# Patient Record
Sex: Female | Born: 1935
Health system: Southern US, Community
[De-identification: ages and names within clinical notes are randomized; demographics above are authoritative.]

## PROBLEM LIST (undated history)

## (undated) DIAGNOSIS — Z91199 Patient's noncompliance with other medical treatment and regimen due to unspecified reason: Secondary | ICD-10-CM

## (undated) DIAGNOSIS — Z720 Tobacco use: Secondary | ICD-10-CM

## (undated) DIAGNOSIS — M1990 Unspecified osteoarthritis, unspecified site: Secondary | ICD-10-CM

## (undated) DIAGNOSIS — G8929 Other chronic pain: Secondary | ICD-10-CM

## (undated) DIAGNOSIS — I10 Essential (primary) hypertension: Secondary | ICD-10-CM

## (undated) DIAGNOSIS — I739 Peripheral vascular disease, unspecified: Secondary | ICD-10-CM

## (undated) DIAGNOSIS — Z9119 Patient's noncompliance with other medical treatment and regimen: Secondary | ICD-10-CM

## (undated) DIAGNOSIS — E785 Hyperlipidemia, unspecified: Secondary | ICD-10-CM

## (undated) DIAGNOSIS — J449 Chronic obstructive pulmonary disease, unspecified: Secondary | ICD-10-CM

## (undated) DIAGNOSIS — U071 COVID-19: Secondary | ICD-10-CM

## (undated) DIAGNOSIS — R52 Pain, unspecified: Secondary | ICD-10-CM

## (undated) DIAGNOSIS — M62838 Other muscle spasm: Secondary | ICD-10-CM

## (undated) DIAGNOSIS — R079 Chest pain, unspecified: Secondary | ICD-10-CM

## (undated) HISTORY — PX: OTHER SURGICAL HISTORY: SHX169

## (undated) HISTORY — DX: Other chronic pain: G89.29

## (undated) HISTORY — PX: BACK SURGERY: SHX140

## (undated) HISTORY — DX: Chronic obstructive pulmonary disease, unspecified: J44.9

## (undated) HISTORY — DX: Peripheral vascular disease, unspecified: I73.9

## (undated) HISTORY — DX: COVID-19: U07.1

## (undated) HISTORY — DX: Hyperlipidemia, unspecified: E78.5

---

## 1998-09-17 HISTORY — PX: JOINT REPLACEMENT: SHX530

## 1998-12-20 ENCOUNTER — Encounter: Payer: Self-pay | Admitting: Orthopedic Surgery

## 1998-12-26 ENCOUNTER — Inpatient Hospital Stay (HOSPITAL_COMMUNITY): Admission: RE | Admit: 1998-12-26 | Discharge: 1998-12-30 | Payer: Self-pay | Admitting: Orthopedic Surgery

## 1998-12-26 ENCOUNTER — Encounter: Payer: Self-pay | Admitting: Orthopedic Surgery

## 1999-04-26 ENCOUNTER — Encounter: Admission: RE | Admit: 1999-04-26 | Discharge: 1999-05-25 | Payer: Self-pay | Admitting: Orthopedic Surgery

## 1999-06-20 ENCOUNTER — Encounter: Admission: RE | Admit: 1999-06-20 | Discharge: 1999-08-28 | Payer: Self-pay | Admitting: Orthopedic Surgery

## 2000-02-23 ENCOUNTER — Encounter: Payer: Self-pay | Admitting: Emergency Medicine

## 2000-02-23 ENCOUNTER — Emergency Department (HOSPITAL_COMMUNITY): Admission: EM | Admit: 2000-02-23 | Discharge: 2000-02-23 | Payer: Self-pay | Admitting: Emergency Medicine

## 2000-03-18 ENCOUNTER — Encounter: Admission: RE | Admit: 2000-03-18 | Discharge: 2000-04-18 | Payer: Self-pay | Admitting: Orthopedic Surgery

## 2000-09-26 ENCOUNTER — Emergency Department (HOSPITAL_COMMUNITY): Admission: EM | Admit: 2000-09-26 | Discharge: 2000-09-26 | Payer: Self-pay | Admitting: Emergency Medicine

## 2002-09-20 ENCOUNTER — Encounter: Payer: Self-pay | Admitting: *Deleted

## 2002-09-20 ENCOUNTER — Emergency Department (HOSPITAL_COMMUNITY): Admission: EM | Admit: 2002-09-20 | Discharge: 2002-09-20 | Payer: Self-pay

## 2002-10-02 ENCOUNTER — Encounter: Payer: Self-pay | Admitting: Internal Medicine

## 2002-10-02 ENCOUNTER — Encounter: Admission: RE | Admit: 2002-10-02 | Discharge: 2002-10-02 | Payer: Self-pay | Admitting: Internal Medicine

## 2004-01-03 ENCOUNTER — Encounter: Admission: RE | Admit: 2004-01-03 | Discharge: 2004-03-16 | Payer: Self-pay | Admitting: Orthopedic Surgery

## 2004-02-09 ENCOUNTER — Encounter: Admission: RE | Admit: 2004-02-09 | Discharge: 2004-02-09 | Payer: Self-pay | Admitting: Internal Medicine

## 2004-03-31 ENCOUNTER — Ambulatory Visit (HOSPITAL_COMMUNITY): Admission: RE | Admit: 2004-03-31 | Discharge: 2004-03-31 | Payer: Self-pay | Admitting: Orthopedic Surgery

## 2004-04-18 ENCOUNTER — Ambulatory Visit (HOSPITAL_COMMUNITY): Admission: RE | Admit: 2004-04-18 | Discharge: 2004-04-18 | Payer: Self-pay | Admitting: *Deleted

## 2004-04-18 ENCOUNTER — Encounter (INDEPENDENT_AMBULATORY_CARE_PROVIDER_SITE_OTHER): Payer: Self-pay | Admitting: *Deleted

## 2004-05-07 ENCOUNTER — Ambulatory Visit (HOSPITAL_COMMUNITY): Admission: RE | Admit: 2004-05-07 | Discharge: 2004-05-07 | Payer: Self-pay | Admitting: Orthopedic Surgery

## 2005-05-02 ENCOUNTER — Encounter: Admission: RE | Admit: 2005-05-02 | Discharge: 2005-05-02 | Payer: Self-pay | Admitting: Internal Medicine

## 2006-06-04 ENCOUNTER — Encounter: Admission: RE | Admit: 2006-06-04 | Discharge: 2006-06-04 | Payer: Self-pay | Admitting: Internal Medicine

## 2007-02-19 ENCOUNTER — Encounter: Admission: RE | Admit: 2007-02-19 | Discharge: 2007-02-19 | Payer: Self-pay | Admitting: Orthopedic Surgery

## 2007-06-09 ENCOUNTER — Encounter: Admission: RE | Admit: 2007-06-09 | Discharge: 2007-06-09 | Payer: Self-pay | Admitting: Internal Medicine

## 2008-12-27 ENCOUNTER — Encounter: Admission: RE | Admit: 2008-12-27 | Discharge: 2008-12-27 | Payer: Self-pay | Admitting: Internal Medicine

## 2009-04-16 ENCOUNTER — Emergency Department (HOSPITAL_COMMUNITY): Admission: EM | Admit: 2009-04-16 | Discharge: 2009-04-16 | Payer: Self-pay | Admitting: Emergency Medicine

## 2010-10-08 ENCOUNTER — Encounter: Payer: Self-pay | Admitting: Orthopedic Surgery

## 2010-11-14 ENCOUNTER — Emergency Department (HOSPITAL_COMMUNITY)
Admission: EM | Admit: 2010-11-14 | Discharge: 2010-11-14 | Disposition: A | Discharge status: Home / Self Care | Payer: PRIVATE HEALTH INSURANCE | Attending: Emergency Medicine | Admitting: Emergency Medicine

## 2010-11-14 DIAGNOSIS — Y992 Volunteer activity: Secondary | ICD-10-CM | POA: Insufficient documentation

## 2010-11-14 DIAGNOSIS — I10 Essential (primary) hypertension: Secondary | ICD-10-CM | POA: Insufficient documentation

## 2010-11-14 DIAGNOSIS — M542 Cervicalgia: Secondary | ICD-10-CM | POA: Insufficient documentation

## 2010-11-14 DIAGNOSIS — T148XXA Other injury of unspecified body region, initial encounter: Secondary | ICD-10-CM | POA: Insufficient documentation

## 2010-11-14 DIAGNOSIS — X503XXA Overexertion from repetitive movements, initial encounter: Secondary | ICD-10-CM | POA: Insufficient documentation

## 2010-11-14 DIAGNOSIS — E785 Hyperlipidemia, unspecified: Secondary | ICD-10-CM | POA: Insufficient documentation

## 2010-11-14 DIAGNOSIS — M549 Dorsalgia, unspecified: Secondary | ICD-10-CM | POA: Insufficient documentation

## 2011-06-02 ENCOUNTER — Emergency Department (HOSPITAL_COMMUNITY)
Admission: EM | Admit: 2011-06-02 | Discharge: 2011-06-02 | Disposition: A | Discharge status: Home / Self Care | Payer: PRIVATE HEALTH INSURANCE | Attending: Emergency Medicine | Admitting: Emergency Medicine

## 2011-06-02 ENCOUNTER — Emergency Department (HOSPITAL_COMMUNITY): Payer: PRIVATE HEALTH INSURANCE

## 2011-06-02 DIAGNOSIS — I10 Essential (primary) hypertension: Secondary | ICD-10-CM | POA: Insufficient documentation

## 2011-06-02 DIAGNOSIS — M79609 Pain in unspecified limb: Secondary | ICD-10-CM | POA: Insufficient documentation

## 2011-06-02 DIAGNOSIS — M1990 Unspecified osteoarthritis, unspecified site: Secondary | ICD-10-CM | POA: Insufficient documentation

## 2011-06-02 DIAGNOSIS — M5137 Other intervertebral disc degeneration, lumbosacral region: Secondary | ICD-10-CM | POA: Insufficient documentation

## 2011-06-02 DIAGNOSIS — E785 Hyperlipidemia, unspecified: Secondary | ICD-10-CM | POA: Insufficient documentation

## 2011-06-02 DIAGNOSIS — Z96649 Presence of unspecified artificial hip joint: Secondary | ICD-10-CM | POA: Insufficient documentation

## 2011-06-02 DIAGNOSIS — M51379 Other intervertebral disc degeneration, lumbosacral region without mention of lumbar back pain or lower extremity pain: Secondary | ICD-10-CM | POA: Insufficient documentation

## 2011-06-02 DIAGNOSIS — M949 Disorder of cartilage, unspecified: Secondary | ICD-10-CM | POA: Insufficient documentation

## 2011-06-02 DIAGNOSIS — M899 Disorder of bone, unspecified: Secondary | ICD-10-CM | POA: Insufficient documentation

## 2011-06-02 DIAGNOSIS — M479 Spondylosis, unspecified: Secondary | ICD-10-CM | POA: Insufficient documentation

## 2011-06-02 LAB — POCT I-STAT, CHEM 8
BUN: 14 mg/dL (ref 6–23)
Calcium, Ion: 1.15 mmol/L (ref 1.12–1.32)
HCT: 41 % (ref 36.0–46.0)
Hemoglobin: 13.9 g/dL (ref 12.0–15.0)
Potassium: 4.2 mEq/L (ref 3.5–5.1)
TCO2: 25 mmol/L (ref 0–100)

## 2011-06-04 ENCOUNTER — Other Ambulatory Visit: Payer: Self-pay | Admitting: Family Medicine

## 2011-06-05 ENCOUNTER — Other Ambulatory Visit: Payer: Self-pay | Admitting: Family Medicine

## 2011-07-01 ENCOUNTER — Emergency Department (HOSPITAL_COMMUNITY)
Admission: EM | Admit: 2011-07-01 | Discharge: 2011-07-01 | Disposition: A | Discharge status: Home / Self Care | Payer: PRIVATE HEALTH INSURANCE | Attending: Emergency Medicine | Admitting: Emergency Medicine

## 2011-07-01 ENCOUNTER — Emergency Department (HOSPITAL_COMMUNITY): Payer: PRIVATE HEALTH INSURANCE

## 2011-07-01 ENCOUNTER — Encounter: Payer: Self-pay | Admitting: *Deleted

## 2011-07-01 DIAGNOSIS — M1990 Unspecified osteoarthritis, unspecified site: Secondary | ICD-10-CM | POA: Insufficient documentation

## 2011-07-01 DIAGNOSIS — M542 Cervicalgia: Secondary | ICD-10-CM | POA: Insufficient documentation

## 2011-07-01 DIAGNOSIS — F17200 Nicotine dependence, unspecified, uncomplicated: Secondary | ICD-10-CM | POA: Insufficient documentation

## 2011-07-01 HISTORY — DX: Unspecified osteoarthritis, unspecified site: M19.90

## 2011-07-01 MED ORDER — HYDROMORPHONE HCL 1 MG/ML IJ SOLN
1.0000 mg | Freq: Once | INTRAMUSCULAR | Status: AC
  Administered 2011-07-01: 1 mg via INTRAMUSCULAR
  Filled 2011-07-01: qty 1

## 2011-07-01 MED ORDER — OXYCODONE-ACETAMINOPHEN 5-325 MG PO TABS
2.0000 | ORAL_TABLET | Freq: Once | ORAL | Status: AC
  Administered 2011-07-01: 2 via ORAL
  Filled 2011-07-01: qty 2

## 2011-07-01 MED ORDER — DIAZEPAM 5 MG/ML IJ SOLN: 5.0000 mg | Freq: Once | INTRAMUSCULAR | Status: DC

## 2011-07-01 MED ORDER — DIAZEPAM 5 MG/ML IJ SOLN
5.0000 mg | Freq: Once | INTRAMUSCULAR | Status: AC
  Administered 2011-07-01: 5 mg via INTRAMUSCULAR
  Filled 2011-07-01: qty 2

## 2011-07-01 MED ORDER — DIAZEPAM 5 MG PO TABS: 5.0000 mg | ORAL_TABLET | Freq: Two times a day (BID) | ORAL | Status: AC

## 2011-07-01 MED ORDER — OXYCODONE-ACETAMINOPHEN 5-325 MG PO TABS: 2.0000 | ORAL_TABLET | ORAL | Status: AC | PRN

## 2011-07-01 NOTE — ED Provider Notes (Signed)
History   This chart was scribed for Glynn Octave, MD by Clarita Crane. The patient was seen in room APA18/APA18 and the patient's care was started at 7:12AM.   CSN: [id number] Arrival date & time: 07/01/2011  6:55 AM  Chief Complaint  Patient presents with  . Neck Pain    (Consider location/radiation/quality/duration/timing/severity/associated sxs/prior treatment) HPI Kiara Little is a 75 y.o. female who presents to the Emergency Department complaining of constant non-radiating neck pain localized to midline of posterior neck onset several weeks ago and worsening since. Reports neck pain is aggravated with turning of head laterally and mildly relieved with use of Hydrocodone. Denies n/v, HA, change in vision, numbness, tingling, weakness, chest pain, abdominal pain, SOB, back pain, incontinence. Reports she was evaluated by PCP several days ago for neck pain with imaging performed and told neck pain was result of arthritis. States she was prescribed Hydrocodone at that time. Patient with h/o hypertension and states she has not been compliant with medications.   Past Medical History  Diagnosis Date  . Arthritis     Past Surgical History  Procedure Date  . Back surgery   . Hip surgery     No family history on file.  History  Substance Use Topics  . Smoking status: Current Everyday Smoker  . Smokeless tobacco: Not on file  . Alcohol Use: No    OB History    Grav Para Term Preterm Abortions TAB SAB Ect Mult Living                  Review of Systems 10 Systems reviewed and are negative for acute change except as noted in the HPI.  Allergies  Review of patient's allergies indicates no known allergies.  Home Medications   Current Outpatient Rx  Name Route Sig Dispense Refill  . CYCLOBENZAPRINE HCL 10 MG PO TABS Oral Take 10 mg by mouth 3 (three) times daily as needed.      Marland Kitchen DICLOFENAC SODIUM 1 % TD GEL Topical Apply topically.      Marland Kitchen HYDROCODONE-ACETAMINOPHEN  5-500 MG PO TABS Oral Take 1 tablet by mouth every 6 (six) hours as needed.        BP 203/118  Pulse 88  Temp(Src) 98.5 F (36.9 C) (Oral)  Resp 20  Ht 5\' 9"  (1.753 m)  Wt 171 lb (77.565 kg)  BMI 25.25 kg/m2  SpO2 97%  Physical Exam  Nursing note and vitals reviewed. Constitutional: She is oriented to person, place, and time. She appears well-developed and well-nourished. No distress.  HENT:  Head: Normocephalic and atraumatic.  Eyes: EOM are normal. Pupils are equal, round, and reactive to light.  Neck: Neck supple. No tracheal deviation present.       No meningeal signs. No nuchal rigidity.   Cardiovascular: Normal rate and regular rhythm.  Exam reveals no friction rub.   No murmur heard. Pulmonary/Chest: Effort normal. No respiratory distress. She has no wheezes. She has no rales.  Abdominal: Soft. She exhibits no distension. There is no tenderness.  Musculoskeletal: Normal range of motion. She exhibits no edema.       T-spine, L-spine non-tender.   Neurological: She is alert and oriented to person, place, and time. No cranial nerve deficit or sensory deficit.       Strength of bilateral upper extremities normal and equal.   Skin: Skin is warm and dry.  Psychiatric: She has a normal mood and affect. Her behavior is normal.    ED  Course  Procedures (including critical care time)  DIAGNOSTIC STUDIES:   COORDINATION OF CARE: 9:02AM- Patient reports neck pain improved after administration of Hydrocodone-1mg .  9:42AM- Patient informed of imaging results which show severe arthritis. Recommend that she obtain MRI of C-spine for further evaluation but MRI unavailable today. Upon reevaluation patient is still tender to palpation over C-spine and strength still normal and equal of bilateral lower extremity.  10:38AM- Patient reports pain improved. Informed of intent to d/c home and recommend follow up with PCP tomorrow regarding chronic neck pain.   Labs Reviewed - No data to  display Ct Cervical Spine Wo Contrast  07/01/2011  *RADIOLOGY REPORT*  Clinical Data: Severe neck pain x 2 weeks, no injury  CT CERVICAL SPINE WITHOUT CONTRAST  Technique:  Multidetector CT imaging of the cervical spine was performed. Multiplanar CT image reconstructions were also generated.  Comparison: MRI c-spine dated 05/07/2004  Findings: Reversal of the normal cervical lordosis.  No evidence of fracture or dislocation. Vertebral body heights are maintained.  The dens appears intact.  No prevertebral soft tissue swelling.  Multilevel degenerative changes. Disc-osteophyte complexes with narrowing of the spinal canal at multiple levels, most severe at C6- 7.  Visualized thyroid is unremarkable.  Visualized lung apices are clear.  IMPRESSION: No fracture or dislocation is seen.  Extensive multilevel degenerative changes, with severe narrowing of the spinal canal at C6-7. This appearance is similar but likely mildly progressed from prior MRI.  Original Report Authenticated By: Charline Bills, M.D.     No diagnosis found.    MDM  History of osteoarthritis presenting with 3 weeks of worsening bilateral neck pain that is constant. She denies any weakness, numbness, tingling, dropping objects, weakened grip strength. No fever bowel or bladder incontinence or vomiting. Patient was mistaken about her recent imaging as it was actually of her lumbar spine.  She does have severe spinal stenosis at C6-C7 which is similar to her previous MRI in 2005. She has no neurological deficits or red flags demanding an MRI today. MRI is subsequent none available today and feels appropriate the abdomen is outpatient. We'll attempt to control her pain.  Patient reports improvement in pain with Percocet. Her neuro exam continues to be stable and she denies any weakness, numbness, tingling or problems with her. She is stable for outpatient followup. Is instructed to call her doctor first in the morning.  I personally  performed the services described in this documentation, which was scribed in my presence.  The recorded information has been reviewed and considered.    Glynn Octave, MD 07/01/11 1048

## 2011-07-01 NOTE — ED Notes (Signed)
Pt c/o pain in both sides of her neck. Pt c/o pain when turning her head from side to side. States that she was seen by her MD and put on meds but they are not helping. Pt also states that she had a MRI at [facility] 3 weeks ago. Pt alert and oriented x 3. Skin warm and dry. Color pink. Breath sounds clear and equal bilaterally. No weakness in arms.

## 2011-07-01 NOTE — ED Notes (Signed)
Pt a/ox4. Resp even and unlabored. NAD at this time. D/C instructions reviewed with Rx x2 . Pt verbalized understanding. Pt ambulated to POV with steady gate. Family to transport home.

## 2011-07-01 NOTE — ED Notes (Signed)
Pt reports pain to neck for the past 3 weeks, pt reports pain is unbearable, pt very tearful

## 2011-08-19 ENCOUNTER — Encounter (HOSPITAL_COMMUNITY): Payer: Self-pay

## 2011-08-19 ENCOUNTER — Emergency Department (HOSPITAL_COMMUNITY)
Admission: EM | Admit: 2011-08-19 | Discharge: 2011-08-19 | Disposition: A | Discharge status: Home / Self Care | Payer: PRIVATE HEALTH INSURANCE | Attending: Emergency Medicine | Admitting: Emergency Medicine

## 2011-08-19 DIAGNOSIS — X58XXXA Exposure to other specified factors, initial encounter: Secondary | ICD-10-CM | POA: Insufficient documentation

## 2011-08-19 DIAGNOSIS — I10 Essential (primary) hypertension: Secondary | ICD-10-CM | POA: Insufficient documentation

## 2011-08-19 DIAGNOSIS — F17200 Nicotine dependence, unspecified, uncomplicated: Secondary | ICD-10-CM | POA: Insufficient documentation

## 2011-08-19 DIAGNOSIS — T783XXA Angioneurotic edema, initial encounter: Secondary | ICD-10-CM | POA: Insufficient documentation

## 2011-08-19 DIAGNOSIS — M129 Arthropathy, unspecified: Secondary | ICD-10-CM | POA: Insufficient documentation

## 2011-08-19 HISTORY — DX: Other muscle spasm: M62.838

## 2011-08-19 HISTORY — DX: Essential (primary) hypertension: I10

## 2011-08-19 MED ORDER — DIPHENHYDRAMINE HCL 50 MG/ML IJ SOLN
12.5000 mg | Freq: Once | INTRAMUSCULAR | Status: AC
  Administered 2011-08-19: 12.5 mg via INTRAVENOUS
  Filled 2011-08-19: qty 1

## 2011-08-19 MED ORDER — DIPHENHYDRAMINE HCL 25 MG PO TABS: 25.0000 mg | ORAL_TABLET | Freq: Four times a day (QID) | ORAL | Status: DC | PRN

## 2011-08-19 MED ORDER — PREDNISONE 20 MG PO TABS: 40.0000 mg | ORAL_TABLET | Freq: Every day | ORAL | Status: AC

## 2011-08-19 MED ORDER — METHYLPREDNISOLONE SODIUM SUCC 125 MG IJ SOLR
125.0000 mg | Freq: Once | INTRAMUSCULAR | Status: AC
  Administered 2011-08-19: 125 mg via INTRAVENOUS
  Filled 2011-08-19: qty 2

## 2011-08-19 MED ORDER — SODIUM CHLORIDE 0.9 % IV SOLN
Freq: Once | INTRAVENOUS | Status: AC
  Administered 2011-08-19: 03:00:00 via INTRAVENOUS

## 2011-08-19 NOTE — ED Notes (Signed)
To lobby in wheelchair, awaiting son for pickup

## 2011-08-19 NOTE — ED Provider Notes (Signed)
History     CSN: [id number] Arrival date & time: 08/19/2011  2:22 AM   First MD Initiated Contact with Patient 08/19/11 [phone]      Chief Complaint  Patient presents with  . Oral Swelling    (Consider location/radiation/quality/duration/timing/severity/associated sxs/prior treatment) The history is provided by the patient.   patient woke up about 11:00 tonight, and the feeling that her tongue swelling. She states she woke up and went back to sleep a couple times with it. She states that she eats motion earlier today and wonders if that could cause to. Initially a little trouble swallowing, no trouble breathing. No fevers. No trauma. She's not had episodes like this before. No change in medications. She states that since it started it has gotten better after she took a spoon of salt. No other swelling.  Past Medical History  Diagnosis Date  . Arthritis   . Muscle spasm   . Hypertension     Past Surgical History  Procedure Date  . Back surgery   . Hip surgery     No family history on file.  History  Substance Use Topics  . Smoking status: Current Everyday Smoker  . Smokeless tobacco: Not on file  . Alcohol Use: No    OB History    Grav Para Term Preterm Abortions TAB SAB Ect Mult Living                  Review of Systems  Constitutional: Negative for chills and fatigue.  HENT: Positive for trouble swallowing. Negative for voice change.        Tongue swelling  Eyes: Negative for pain.  Respiratory: Negative for cough and shortness of breath.   Cardiovascular: Negative for chest pain.  Gastrointestinal: Negative for abdominal pain.  Skin: Negative for rash.  Neurological: Negative for dizziness and light-headedness.    Allergies  Review of patient's allergies indicates no known allergies.  Home Medications   Current Outpatient Rx  Name Route Sig Dispense Refill  . CALCIUM CARBONATE 600 MG PO TABS Oral Take 600 mg by mouth 2 (two) times daily with a meal.        . DIAZEPAM 5 MG PO TABS Oral Take 5 mg by mouth every 12 (twelve) hours as needed.      . METHOCARBAMOL 500 MG PO TABS Oral Take 500 mg by mouth 3 (three) times daily.      Marland Kitchen NABUMETONE 500 MG PO TABS Oral Take 500 mg by mouth 2 (two) times daily.      Marland Kitchen AMLODIPINE BESYLATE 5 MG PO TABS Oral Take 5 mg by mouth daily.      . CYCLOBENZAPRINE HCL 10 MG PO TABS Oral Take 10 mg by mouth at bedtime as needed. For muscle spasms    . DICLOFENAC SODIUM 1 % TD GEL Topical Apply 1 application topically 4 (four) times daily. Have not started    . DIPHENHYDRAMINE HCL 25 MG PO TABS Oral Take 1 tablet (25 mg total) by mouth every 6 (six) hours as needed for itching. 20 tablet 0  . ERGOCALCIFEROL 50000 UNITS PO CAPS Oral Take 50,000 Units by mouth once a week. Has not start taken     . HYDROCODONE-ACETAMINOPHEN 5-500 MG PO TABS Oral Take 1 tablet by mouth every 6 (six) hours as needed. Pain     . IBUPROFEN 600 MG PO TABS Oral Take 600 mg by mouth every 6 (six) hours as needed. Inflammation      .  MULTIVITAMINS PO TABS Oral Take 1 tablet by mouth daily.      Marland Kitchen NAPROXEN-ESOMEPRAZOLE 500-20 MG PO TBEC Oral Take 1 tablet by mouth 2 (two) times daily.      Marland Kitchen PREDNISONE 20 MG PO TABS Oral Take 2 tablets (40 mg total) by mouth daily. 6 tablet 0    BP 122/76  Pulse 69  Temp(Src) 98.4 F (36.9 C) (Oral)  Resp 14  Ht 5\' 9"  (1.753 m)  Wt 170 lb (77.111 kg)  BMI 25.10 kg/m2  SpO2 96%  Physical Exam  Nursing note and vitals reviewed. Constitutional: She is oriented to person, place, and time. She appears well-developed and well-nourished.  HENT:  Head: Normocephalic and atraumatic.       Angioedema of tongue, worse on the left side. No stridor. No posterior swelling. No swelling of floor of mouth  Eyes: EOM are normal. Pupils are equal, round, and reactive to light.  Neck: Normal range of motion. Neck supple.  Cardiovascular: Normal rate, regular rhythm and normal heart sounds.   No murmur  heard. Pulmonary/Chest: Effort normal and breath sounds normal. No respiratory distress. She has no wheezes. She has no rales.  Abdominal: Soft. Bowel sounds are normal. She exhibits no distension. There is no tenderness. There is no rebound and no guarding.  Musculoskeletal: Normal range of motion.  Neurological: She is alert and oriented to person, place, and time. No cranial nerve deficit.  Skin: Skin is warm and dry.  Psychiatric: She has a normal mood and affect. Her speech is normal.    ED Course  Procedures (including critical care time)  Labs Reviewed - No data to display No results found.   1. Angioedema       MDM  Angioedema of tongue. Began about 11:00 tonight. Initially worsened then improved it has been stable to improving since she got here. She is on lisinopril, to be stopped. She has followup in a day and a half the primary care Dr. Instructions were given and she'll return to the ER if necessary before.        Juliet Rude. Rubin Payor, MD 08/19/11 [phone]

## 2011-08-19 NOTE — ED Notes (Signed)
Pt stated she began having tongue swelling around 11pm last hs, had to wait on family member to arrive for transport to er, denies any sob. Able to answer all ?'s at arrival.

## 2011-08-19 NOTE — ED Notes (Signed)
Pt stated she is feeling much better, after meds.

## 2011-08-19 NOTE — ED Notes (Signed)
Tongue sweeling since 11pm last hs

## 2011-08-19 NOTE — ED Notes (Signed)
Pt stated she has been out of percocet for 1 week,

## 2013-02-05 ENCOUNTER — Observation Stay (HOSPITAL_COMMUNITY)
Admission: EM | Admit: 2013-02-05 | Discharge: 2013-02-06 | Disposition: A | Discharge status: Home / Self Care | Payer: PRIVATE HEALTH INSURANCE | Attending: Internal Medicine | Admitting: Internal Medicine

## 2013-02-05 ENCOUNTER — Emergency Department (HOSPITAL_COMMUNITY): Payer: PRIVATE HEALTH INSURANCE

## 2013-02-05 DIAGNOSIS — Z9119 Patient's noncompliance with other medical treatment and regimen: Secondary | ICD-10-CM | POA: Insufficient documentation

## 2013-02-05 DIAGNOSIS — Z91199 Patient's noncompliance with other medical treatment and regimen due to unspecified reason: Secondary | ICD-10-CM | POA: Insufficient documentation

## 2013-02-05 DIAGNOSIS — Z720 Tobacco use: Secondary | ICD-10-CM

## 2013-02-05 DIAGNOSIS — F419 Anxiety disorder, unspecified: Secondary | ICD-10-CM

## 2013-02-05 DIAGNOSIS — R0789 Other chest pain: Principal | ICD-10-CM

## 2013-02-05 DIAGNOSIS — F17200 Nicotine dependence, unspecified, uncomplicated: Secondary | ICD-10-CM | POA: Insufficient documentation

## 2013-02-05 DIAGNOSIS — F411 Generalized anxiety disorder: Secondary | ICD-10-CM | POA: Insufficient documentation

## 2013-02-05 DIAGNOSIS — I10 Essential (primary) hypertension: Secondary | ICD-10-CM

## 2013-02-05 DIAGNOSIS — R079 Chest pain, unspecified: Secondary | ICD-10-CM

## 2013-02-05 DIAGNOSIS — Z79899 Other long term (current) drug therapy: Secondary | ICD-10-CM | POA: Insufficient documentation

## 2013-02-05 HISTORY — DX: Patient's noncompliance with other medical treatment and regimen due to unspecified reason: Z91.199

## 2013-02-05 HISTORY — DX: Patient's noncompliance with other medical treatment and regimen: Z91.19

## 2013-02-05 HISTORY — DX: Unspecified osteoarthritis, unspecified site: M19.90

## 2013-02-05 HISTORY — DX: Tobacco use: Z72.0

## 2013-02-05 HISTORY — DX: Chest pain, unspecified: R07.9

## 2013-02-05 LAB — CBC
Hemoglobin: 13.3 g/dL (ref 12.0–15.0)
MCH: 30.8 pg (ref 26.0–34.0)
MCH: 30 pg (ref 26.0–34.0)
MCHC: 33.6 g/dL (ref 30.0–36.0)
MCV: 91.6 fL (ref 78.0–100.0)
Platelets: 222 10*3/uL (ref 150–400)
Platelets: 196 10*3/uL (ref 150–400)
RBC: 4.43 MIL/uL (ref 3.87–5.11)
RDW: 15.7 % — ABNORMAL HIGH (ref 11.5–15.5)
WBC: 9 10*3/uL (ref 4.0–10.5)

## 2013-02-05 LAB — COMPREHENSIVE METABOLIC PANEL
AST: 25 U/L (ref 0–37)
Albumin: 3.8 g/dL (ref 3.5–5.2)
CO2: 24 mEq/L (ref 19–32)
Calcium: 9.3 mg/dL (ref 8.4–10.5)
Creatinine, Ser: 0.98 mg/dL (ref 0.50–1.10)
GFR calc non Af Amer: 55 mL/min — ABNORMAL LOW (ref >90)
Total Protein: 7.9 g/dL (ref 6.0–8.3)

## 2013-02-05 LAB — TROPONIN I: Troponin I: <0.3 ng/mL (ref <0.30)

## 2013-02-05 LAB — CREATININE, SERUM
Creatinine, Ser: 0.77 mg/dL (ref 0.50–1.10)
GFR calc non Af Amer: 81 mL/min — ABNORMAL LOW (ref >90)

## 2013-02-05 LAB — CK TOTAL AND CKMB (NOT AT ARMC): CK, MB: 2.1 ng/mL (ref 0.3–4.0)

## 2013-02-05 LAB — POCT I-STAT TROPONIN I

## 2013-02-05 MED ORDER — HYDROCHLOROTHIAZIDE 12.5 MG PO CAPS
12.5000 mg | ORAL_CAPSULE | Freq: Every day | ORAL | Status: DC
  Administered 2013-02-05 – 2013-02-06 (×2): 12.5 mg via ORAL
  Filled 2013-02-05 (×2): qty 1

## 2013-02-05 MED ORDER — LISINOPRIL 10 MG PO TABS
10.0000 mg | ORAL_TABLET | Freq: Every day | ORAL | Status: DC
  Administered 2013-02-05 – 2013-02-06 (×2): 10 mg via ORAL
  Filled 2013-02-05 (×2): qty 1

## 2013-02-05 MED ORDER — CALCIUM CARBONATE 1250 (500 CA) MG PO TABS
1.0000 | ORAL_TABLET | Freq: Every day | ORAL | Status: DC
  Administered 2013-02-06: 500 mg via ORAL
  Filled 2013-02-05 (×2): qty 1

## 2013-02-05 MED ORDER — ONDANSETRON HCL 4 MG/2ML IJ SOLN: 4.0000 mg | Freq: Four times a day (QID) | INTRAMUSCULAR | Status: DC | PRN

## 2013-02-05 MED ORDER — ACETAMINOPHEN 325 MG PO TABS
650.0000 mg | ORAL_TABLET | Freq: Four times a day (QID) | ORAL | Status: DC | PRN
  Administered 2013-02-06: 650 mg via ORAL
  Filled 2013-02-05: qty 2

## 2013-02-05 MED ORDER — ENOXAPARIN SODIUM 40 MG/0.4ML ~~LOC~~ SOLN
40.0000 mg | SUBCUTANEOUS | Status: DC
  Filled 2013-02-05 (×2): qty 0.4

## 2013-02-05 MED ORDER — HYDROMORPHONE HCL PF 1 MG/ML IJ SOLN: 1.0000 mg | INTRAMUSCULAR | Status: DC | PRN

## 2013-02-05 MED ORDER — HYDROCODONE-ACETAMINOPHEN 5-325 MG PO TABS: 1.0000 | ORAL_TABLET | ORAL | Status: DC | PRN

## 2013-02-05 MED ORDER — ASPIRIN EC 81 MG PO TBEC
81.0000 mg | DELAYED_RELEASE_TABLET | Freq: Every day | ORAL | Status: DC
  Administered 2013-02-06: 81 mg via ORAL
  Filled 2013-02-05 (×2): qty 1

## 2013-02-05 MED ORDER — ONDANSETRON HCL 4 MG PO TABS: 4.0000 mg | ORAL_TABLET | Freq: Four times a day (QID) | ORAL | Status: DC | PRN

## 2013-02-05 MED ORDER — ACETAMINOPHEN 650 MG RE SUPP: 650.0000 mg | Freq: Four times a day (QID) | RECTAL | Status: DC | PRN

## 2013-02-05 MED ORDER — SODIUM CHLORIDE 0.9 % IJ SOLN: 3.0000 mL | Freq: Two times a day (BID) | INTRAMUSCULAR | Status: DC

## 2013-02-05 MED ORDER — CALCIUM CARBONATE 600 MG PO TABS
600.0000 mg | ORAL_TABLET | Freq: Every day | ORAL | Status: DC
  Filled 2013-02-05: qty 1

## 2013-02-05 MED ORDER — NICOTINE 21 MG/24HR TD PT24
21.0000 mg | MEDICATED_PATCH | Freq: Every day | TRANSDERMAL | Status: DC
  Administered 2013-02-05: 21 mg via TRANSDERMAL
  Filled 2013-02-05 (×2): qty 1

## 2013-02-05 MED ORDER — ASPIRIN 325 MG PO TABS
325.0000 mg | ORAL_TABLET | Freq: Once | ORAL | Status: AC
  Administered 2013-02-05: 325 mg via ORAL
  Filled 2013-02-05: qty 1

## 2013-02-05 NOTE — ED Notes (Signed)
Attempted report 

## 2013-02-05 NOTE — ED Provider Notes (Signed)
History     CSN: [id number]  Arrival date & time 02/05/13  1432   First MD Initiated Contact with Patient 02/05/13 1520      Chief Complaint  Patient presents with  . Hypertension  . Chest Pain    (Consider location/radiation/quality/duration/timing/severity/associated sxs/prior treatment) HPI Comments: 77 y.o. female w/ pmh of HTN (not compliant with meds), presents to the Er w/ the cc of chest pain. Left sided. States she has never had this type of chest pain before.  Lasted for 2 hours. She states it started after she went to her original primary care's office for a visit -- but they refused to see her because pt states they stated she "owed them money". After this she became anxious, and she started to develop left sided chest pain. No n/v associated w/ this. She states pain has since resolved, but is unclear why she was having left sided chest pain. Has never had a stress test.   Patient is a 77 y.o. female presenting with chest pain. The history is provided by the patient.  Chest Pain Pain location:  L chest Pain quality: aching and dull   Pain radiates to:  Does not radiate Pain radiates to the back: no   Pain severity:  Mild Onset quality:  Sudden Timing:  Constant Progression:  Resolved Chronicity:  New Context: not breathing and not eating   Associated symptoms: no abdominal pain, no cough, no dizziness, no fatigue, no fever, no headache, no numbness and not vomiting     Past Medical History  Diagnosis Date  . Arthritis   . Muscle spasm   . Hypertension     Past Surgical History  Procedure Laterality Date  . Back surgery    . Hip surgery      No family history on file.  History  Substance Use Topics  . Smoking status: Current Every Day Smoker  . Smokeless tobacco: Not on file  . Alcohol Use: No    OB History   Grav Para Term Preterm Abortions TAB SAB Ect Mult Living                  Review of Systems  Constitutional: Negative for fever, chills  and fatigue.  HENT: Negative for facial swelling, drooling, neck pain and dental problem.   Eyes: Negative for pain, discharge and itching.  Respiratory: Negative for cough, choking, wheezing and stridor.   Cardiovascular: Positive for chest pain.  Gastrointestinal: Negative for vomiting, abdominal pain and diarrhea.  Endocrine: Negative for cold intolerance and heat intolerance.  Genitourinary: Negative for vaginal discharge, difficulty urinating and vaginal pain.  Skin: Negative for pallor and rash.  Neurological: Negative for dizziness, light-headedness, numbness and headaches.  Psychiatric/Behavioral: Negative for behavioral problems and agitation.    Allergies  Review of patient's allergies indicates no known allergies.  Home Medications   Current Outpatient Rx  Name  Route  Sig  Dispense  Refill  . amLODipine (NORVASC) 5 MG tablet   Oral   Take 5 mg by mouth daily.           . calcium carbonate (OS-CAL) 600 MG TABS   Oral   Take 600 mg by mouth 2 (two) times daily with a meal.           . cyclobenzaprine (FLEXERIL) 10 MG tablet   Oral   Take 10 mg by mouth at bedtime as needed. For muscle spasms         . diazepam (  VALIUM) 5 MG tablet   Oral   Take 5 mg by mouth every 12 (twelve) hours as needed.           . diclofenac sodium (VOLTAREN) 1 % GEL   Topical   Apply 1 application topically 4 (four) times daily. Have not started         . ergocalciferol (VITAMIN D2) 50000 UNITS capsule   Oral   Take 50,000 Units by mouth once a week. Has not start taken          . HYDROcodone-acetaminophen (VICODIN) 5-500 MG per tablet   Oral   Take 1 tablet by mouth every 6 (six) hours as needed. Pain          . ibuprofen (ADVIL,MOTRIN) 600 MG tablet   Oral   Take 600 mg by mouth every 6 (six) hours as needed. Inflammation           . methocarbamol (ROBAXIN) 500 MG tablet   Oral   Take 500 mg by mouth 3 (three) times daily.           . multivitamin  (THERAGRAN) per tablet   Oral   Take 1 tablet by mouth daily.           . nabumetone (RELAFEN) 500 MG tablet   Oral   Take 500 mg by mouth 2 (two) times daily.           . Naproxen-Esomeprazole (VIMOVO) 500-20 MG TBEC   Oral   Take 1 tablet by mouth 2 (two) times daily.             BP 168/87  Pulse 70  Resp 87  SpO2 96%  Physical Exam  Constitutional: She is oriented to person, place, and time. She appears well-developed. No distress.  HENT:  Head: Normocephalic and atraumatic.  Eyes: Pupils are equal, round, and reactive to light. Right eye exhibits no discharge. Left eye exhibits no discharge.  Neck: Neck supple. No tracheal deviation present.  Cardiovascular: Normal rate.  Exam reveals no gallop and no friction rub.   Pulmonary/Chest: No stridor. No respiratory distress. She has no wheezes.  Abdominal: Soft. She exhibits no distension. There is no tenderness. There is no rebound.  Musculoskeletal: She exhibits no edema and no tenderness.  Neurological: She is alert and oriented to person, place, and time.  Skin: Skin is warm. She is not diaphoretic.    ED Course  Procedures (including critical care time)  Labs Reviewed  CBC - Abnormal; Notable for the following:    RDW 15.7 (*)    All other components within normal limits  COMPREHENSIVE METABOLIC PANEL - Abnormal; Notable for the following:    GFR calc non Af Amer 55 (*)    GFR calc Af Amer 64 (*)    All other components within normal limits  CBC - Abnormal; Notable for the following:    RDW 15.8 (*)    All other components within normal limits  CREATININE, SERUM - Abnormal; Notable for the following:    GFR calc non Af Amer 81 (*)    All other components within normal limits  TROPONIN I  CK TOTAL AND CKMB  BASIC METABOLIC PANEL  CBC  TROPONIN I  TROPONIN I  CK TOTAL AND CKMB  CK TOTAL AND CKMB  LIPID PANEL  POCT I-STAT TROPONIN I   Dg Chest 2 View  02/05/2013   *RADIOLOGY REPORT*  Clinical Data:  Chest pain, history hypertension, smoking  CHEST -  2 VIEW  Comparison: None  Findings: Enlargement of cardiac silhouette. Calcified tortuous aorta. Mediastinal contours and pulmonary vascularity otherwise normal. Emphysematous changes without infiltrate, pleural effusion or pneumothorax. No acute osseous findings.  IMPRESSION: Minimal enlargement of cardiac silhouette. Emphysematous changes question COPD. No acute abnormalities.   Original Report Authenticated By: Ulyses Southward, M.D.      MDM  Pt presents w/ left sided chest pain. Noncompliant on meds because she states she has been "discharged from her PCP" because she has been told she "owes them money". Pt states she does have insurance. Atypical story for chest pain, however, poor follow-up care, and w/ age, and story -- will need further evaluation.   Trop neg, admitted for rule out.    1. Chest pain   2. Anxiety   3. Chest pain, atypical   4. HTN (hypertension)   5. Nicotine abuse          Bernadene Person, MD 02/06/13 [phone]

## 2013-02-05 NOTE — ED Provider Notes (Signed)
Patient seen/examined in the Emergency Department in conjunction with Resident Physician Provider [facility] Patient reports chest pain and HTN Exam : awake/alert, no distress.  She reports her CP is improved Plan: admit for chest pain evaluation and BP management.  Pt agreeable   Joya Gaskins, MD 02/05/13 1756

## 2013-02-05 NOTE — H&P (Signed)
History and Physical       Hospital Admission Note Date: 02/05/2013  Patient name: Kiara Little Medical record number: [id number] Date of birth: 1936-03-12 Age: 77 y.o. Gender: female PCP: Dorrene German, MD    Chief Complaint:  Chest pain today  HPI: Patient is a 77 year old female with history of hypertension (uncontrolled as she has not been compliant with her medications), nicotine abuse presented to ED the chest pain. The patient reports that she had gone to her dentist yesterday and her BP was noted to be 176/95. She was recommended to followup with her PCP. Patient went to her PCPs office but they refused to see her today because she still owed some money. After this she felt very anxious, patient reports that around 1:30 PM, she felt chest tightness, sharp, radiating to left side of her chest but no associated nausea, vomiting, shortness of breath, diaphoresis or any palpitations. Patient reports that the pain has resolved in an hour. Patient had no prior cardiac workup.   Review of Systems:  Constitutional: Denies fever, chills, diaphoresis, poor appetite and fatigue.  HEENT: Denies photophobia, eye pain, redness, hearing loss, ear pain, congestion, sore throat, rhinorrhea, sneezing, mouth sores, trouble swallowing, neck pain, neck stiffness and tinnitus.   Respiratory: Denies SOB, DOE, cough,  and wheezing.   Cardiovascular:  please see HPI Gastrointestinal: Denies nausea, vomiting, abdominal pain, diarrhea, constipation, blood in stool and abdominal distention.  Genitourinary: Denies dysuria, urgency, frequency, hematuria, flank pain and difficulty urinating.  Musculoskeletal: Denies myalgias, back pain, joint swelling, arthralgias and gait problem.  Skin: Denies pallor, rash and wound.  Neurological: Denies dizziness, seizures, syncope, weakness, light-headedness, numbness and headaches.  Hematological: Denies adenopathy.  Easy bruising, personal or family bleeding history  Psychiatric/Behavioral: Denies suicidal ideation, mood changes, confusion, nervousness, sleep disturbance and agitation  Past Medical History: Past Medical History  Diagnosis Date  . Arthritis   . Muscle spasm   . Hypertension    Past Surgical History  Procedure Laterality Date  . Back surgery    . Hip surgery      Medications: Prior to Admission medications   Medication Sig Start Date End Date Taking? Authorizing Provider  calcium carbonate (OS-CAL) 600 MG TABS Take 600 mg by mouth daily.    Yes Historical Provider, MD  naproxen sodium (ANAPROX) 220 MG tablet Take 440 mg by mouth 2 (two) times daily with a meal.   Yes Historical Provider, MD    Allergies:  No Known Allergies  Social History:  reports that she has been smoking.  She smokes at least half a pack a day for last 10 years. She does not have any smokeless tobacco history on file. She reports that she does not drink alcohol or use illicit drugs. She lives at home and is functional with all her ADLs  Family History: No family history on file.  Physical Exam: Blood pressure 134/82, pulse 51, resp. rate 17, SpO2 99.00%. General: Alert, awake, oriented x3, in no acute distress. HEENT: normocephalic, atraumatic, anicteric sclera, pink conjunctiva, pupils equal and reactive to light and accomodation, oropharynx clear Neck: supple, no masses or lymphadenopathy, no goiter, no bruits  Heart: Regular rate and rhythm, without murmurs, rubs or gallops. Lungs: Clear to auscultation bilaterally, no wheezing, rales or rhonchi. Abdomen: Soft, nontender, nondistended, positive bowel sounds, no masses. Extremities: No clubbing, cyanosis or edema with positive pedal pulses. Neuro: Grossly intact, no focal neurological deficits, strength 5/5 upper and lower extremities bilaterally Psych: alert and oriented  x 3, normal mood and affect Skin: no rashes or lesions, warm and  dry   LABS on Admission:  Basic Metabolic Panel:  Recent Labs Lab 02/05/13 1452  NA 139  K 3.8  CL 103  CO2 24  GLUCOSE 84  BUN 18  CREATININE 0.98  CALCIUM 9.3   Liver Function Tests:  Recent Labs Lab 02/05/13 1452  AST 25  ALT 12  ALKPHOS 68  BILITOT 0.4  PROT 7.9  ALBUMIN 3.8   No results found for this basename: LIPASE, AMYLASE,  in the last 168 hours No results found for this basename: AMMONIA,  in the last 168 hours CBC:  Recent Labs Lab 02/05/13 1452  WBC 9.0  HGB 13.9  HCT 41.4  MCV 91.6  PLT 222   Cardiac Enzymes: No results found for this basename: CKTOTAL, CKMB, CKMBINDEX, TROPONINI,  in the last 168 hours BNP: No components found with this basename: POCBNP,  CBG: No results found for this basename: GLUCAP,  in the last 168 hours   Radiological Exams on Admission: Dg Chest 2 View  02/05/2013   *RADIOLOGY REPORT*  Clinical Data: Chest pain, history hypertension, smoking  CHEST - 2 VIEW  Comparison: None  Findings: Enlargement of cardiac silhouette. Calcified tortuous aorta. Mediastinal contours and pulmonary vascularity otherwise normal. Emphysematous changes without infiltrate, pleural effusion or pneumothorax. No acute osseous findings.  IMPRESSION: Minimal enlargement of cardiac silhouette. Emphysematous changes question COPD. No acute abnormalities.   Original Report Authenticated By: Ulyses Southward, M.D.   EKG showed rate 76, normal sinus rhythm with PVCs, and possible left atrial enlargement, no acute ST-T wave changes suggestive of any ischemia  Assessment/Plan Principal Problem:   Chest pain, atypical: Risk factors include nicotine abuse and uncontrolled hypertension, although could have been precipitated by anxiety attack - Admit for observation, telemetry, for rule out ACS - Obtain serial cardiac enzymes, lipid panel, 2-D echo for further workup - Started on aspirin, BP control  Active Problems:   HTN (hypertension) - place her on  lisinopril/HCTZ combination 10/12.5 which is also available on [employer] list - Placed case management consult for medication needs and PCP     Anxiety: Will monitor closely, patient does not have any anxiety at this time    Nicotine abuse - Patient was strongly counseled for nicotine cessation, place on nicotine patch  DVT prophylaxis: Lovenox  CODE STATUS: Full CODE STATUS  Further plan will depend as patient's clinical course evolves and further radiologic and laboratory data become available.   Time Spent on Admission: 45 mins  RAI,RIPUDEEP M.D. [facility] 02/05/2013, 5:54 PM Pager: [phone]  If 7PM-7AM, please contact night-coverage [email or web address] Password [email or web address]

## 2013-02-05 NOTE — ED Notes (Signed)
Chest tightness x 2 hours ago and went to dentist yesterday for fillings and her bp was high

## 2013-02-06 ENCOUNTER — Encounter (HOSPITAL_COMMUNITY): Payer: Self-pay | Admitting: *Deleted

## 2013-02-06 LAB — CBC
HCT: 38.7 % (ref 36.0–46.0)
MCHC: 33.3 g/dL (ref 30.0–36.0)
Platelets: 199 10*3/uL (ref 150–400)
RDW: 15.7 % — ABNORMAL HIGH (ref 11.5–15.5)
WBC: 8.6 10*3/uL (ref 4.0–10.5)

## 2013-02-06 LAB — CK TOTAL AND CKMB (NOT AT ARMC)
CK, MB: 2.1 ng/mL (ref 0.3–4.0)
Relative Index: INVALID (ref 0.0–2.5)
Relative Index: INVALID (ref 0.0–2.5)
Total CK: 90 U/L (ref 7–177)

## 2013-02-06 LAB — BASIC METABOLIC PANEL
BUN: 18 mg/dL (ref 6–23)
Calcium: 9.2 mg/dL (ref 8.4–10.5)
Creatinine, Ser: 0.91 mg/dL (ref 0.50–1.10)
GFR calc Af Amer: 70 mL/min — ABNORMAL LOW (ref >90)
GFR calc non Af Amer: 61 mL/min — ABNORMAL LOW (ref >90)

## 2013-02-06 LAB — LIPID PANEL
Total CHOL/HDL Ratio: 4.5 RATIO
VLDL: 18 mg/dL (ref 0–40)

## 2013-02-06 MED ORDER — ASPIRIN 81 MG PO TBEC: 81.0000 mg | DELAYED_RELEASE_TABLET | Freq: Every day | ORAL | Status: DC

## 2013-02-06 MED ORDER — LISINOPRIL-HYDROCHLOROTHIAZIDE 10-12.5 MG PO TABS: 1.0000 | ORAL_TABLET | Freq: Every day | ORAL | Status: DC

## 2013-02-06 MED ORDER — NICOTINE 21 MG/24HR TD PT24: 1.0000 | MEDICATED_PATCH | Freq: Every day | TRANSDERMAL | Status: DC

## 2013-02-06 NOTE — Discharge Summary (Signed)
Physician Discharge Summary  Kiara Little JYN:[id number] DOB: Feb 20, 1936 DOA: 02/05/2013  PCP: Dorrene German, MD  Admit date: 02/05/2013 Discharge date: 02/06/2013  Recommendations for Outpatient Follow-up:  1. Follow up with cardiology for ECHO and possible outpatient stress test 2. Follow up with PCP for ongoing blood pressure management  Discharge Diagnoses:  Principal Problem:   Chest pain, atypical Active Problems:   HTN (hypertension)   Anxiety   Nicotine abuse   Discharge Condition: stable, improved  Diet recommendation: healthy heart  Wt Readings from Last 3 Encounters:  02/05/13 87.544 kg (193 lb)  08/19/11 77.111 kg (170 lb)  07/01/11 77.565 kg (171 lb)    History of present illness:   Patient is a 77 year old female with history of hypertension (uncontrolled as she has not been compliant with her medications), nicotine abuse presented to ED the chest pain. The patient reports that she had gone to her dentist yesterday and her BP was noted to be 176/95. She was recommended to followup with her PCP. Patient went to her PCPs office but they refused to see her today because she still owed some money. After this she felt very anxious, patient reports that around 1:30 PM, she felt chest tightness, sharp, radiating to left side of her chest but no associated nausea, vomiting, shortness of breath, diaphoresis or any palpitations. Patient reports that the pain has resolved in an hour. Patient had no prior cardiac workup.    Hospital Course:   Chest pain, atypical without SOB, nausea, diaphoresis. Had some LH. Risk factors of HTN and tobacco  - Tele: Sinus brady  - ECG NSR  - troponins neg  - Cardiology consulted for possible stress test, however, patient declines.  Cardiac risk score = 4. - Continue daily ASA  - Consider addition of statin, may be discussed by PCP - No beta blocker due to bradycardia   HTN: Blood pressure wnl this morning  - Continued ACEI/HCTZ    Cholesterol: Lab Results   Component  Value  Date    CHOL  194  02/06/2013    HDL  43  02/06/2013    LDLCALC  133*  02/06/2013    TRIG  91  02/06/2013    CHOLHDL  4.5  02/06/2013   - Consider addition of statin as outpatient  Cigarette dependence with withdrawal: Offered nicotine patch, Counseled cessation   Consultants:  [facility] cardiology Procedures:  none Antibiotics:  none   Discharge Exam: Filed Vitals:   02/06/13 0635  BP: 128/73  Pulse: 52  Temp: 98.4 F (36.9 C)  Resp:    Filed Vitals:   02/05/13 1904 02/05/13 2215 02/05/13 2235 02/06/13 0635  BP: 182/105 144/82 155/89 128/73  Pulse: 53 56  52  Temp:  98.3 F (36.8 C)  98.4 F (36.9 C)  TempSrc:  Oral  Oral  Resp: 18     Height:   5\' 9"  (1.753 m)   Weight:   87.544 kg (193 lb)   SpO2: 98% 99%  96%    General: AAF, No acute distress  HEENT: NCAT, MMM  Cardiovascular: RRR, nl S1, S2 no mrg, 2+ pulses, warm extremities  Respiratory: CTAB, no increased WOB  Abdomen: NABS, soft, NT/ND  MSK: Normal tone and bulk, no LEE  Neuro: Grossly intact   Discharge Instructions      Discharge Orders   Future Appointments Provider Department Dept Phone   02/11/2013 11:00 AM [facility] Stress Lab [facility]AC [facility] [phone]   02/19/2013 2:20 PM  Jodelle Gross, NP [facility] [place] [phone]   Future Orders Complete By Expires     Call MD for:  difficulty breathing, headache or visual disturbances  As directed     Call MD for:  extreme fatigue  As directed     Call MD for:  hives  As directed     Call MD for:  persistant dizziness or light-headedness  As directed     Call MD for:  persistant nausea and vomiting  As directed     Call MD for:  severe uncontrolled pain  As directed     Call MD for:  temperature >100.4  As directed     Diet - low sodium heart healthy  As directed     Discharge instructions  As directed     Comments:      You were hospitalized with high blood  pressure and chest pain.  You were started on blood pressure medication, a combination pill of hydrochlorothiazide, a diuretic, and lisinopril, an ACE inhibitor.  Please take this medication daily.  You were also started on a low dose aspirin 81mg  daily.  Please follow up with your primary care doctor for a blood pressure check within 1 week and with the cardiologist for an outpatient stress test later this month.  Please call [phone] if you have severe chest pain with shortness of breath, nausea, sweating, or lightheadedness.    Increase activity slowly  As directed         Medication List    TAKE these medications       aspirin 81 MG EC tablet  Take 1 tablet (81 mg total) by mouth daily.     calcium carbonate 600 MG Tabs  Commonly known as:  OS-CAL  Take 600 mg by mouth daily.     lisinopril-hydrochlorothiazide 10-12.5 MG per tablet  Commonly known as:  PRINZIDE,ZESTORETIC  Take 1 tablet by mouth daily.     naproxen sodium 220 MG tablet  Commonly known as:  ANAPROX  Take 440 mg by mouth 2 (two) times daily with a meal.     nicotine 21 mg/24hr patch  Commonly known as:  NICODERM CQ - dosed in mg/24 hours  Place 1 patch onto the skin daily.       Follow-up Information   Follow up with Joni Reining, NP On 02/19/2013. (2:20 PM)    Contact information:   [address] [place] [address] [phone]       Follow up with [facility] Main Entrance Check In On 02/11/2013. (8:30 AM - for pharmacologic stress test.  Nothing to eat after midnight.  No caffeine on 5/27.)    Contact information:   [address] [place] [address] [phone]      Follow up with Fleet Contras A, MD. Schedule an appointment as soon as possible for a visit in 1 week.   Contact information:   [address] [place] [address] [phone]        The results of significant diagnostics from this hospitalization (including imaging, microbiology, ancillary and laboratory) are  listed below for reference.    Significant Diagnostic Studies: Dg Chest 2 View  02/05/2013   *RADIOLOGY REPORT*  Clinical Data: Chest pain, history hypertension, smoking  CHEST - 2 VIEW  Comparison: None  Findings: Enlargement of cardiac silhouette. Calcified tortuous aorta. Mediastinal contours and pulmonary vascularity otherwise normal. Emphysematous changes without infiltrate, pleural effusion or pneumothorax. No acute osseous findings.  IMPRESSION: Minimal enlargement of cardiac silhouette. Emphysematous changes question COPD. No acute abnormalities.   Original Report Authenticated By: Ulyses Southward, M.D.    Microbiology: No results found for this or any previous visit (from the past 240 hour(s)).   Labs: Basic Metabolic Panel:  Recent Labs Lab 02/05/13 1452 02/05/13 2049 02/06/13 0247  NA 139  --  138  K 3.8  --  3.5  CL 103  --  102  CO2 24  --  26  GLUCOSE 84  --  93  BUN 18  --  18  CREATININE 0.98 0.77 0.91  CALCIUM 9.3  --  9.2   Liver Function Tests:  Recent Labs Lab 02/05/13 1452  AST 25  ALT 12  ALKPHOS 68  BILITOT 0.4  PROT 7.9  ALBUMIN 3.8   No results found for this basename: LIPASE, AMYLASE,  in the last 168 hours No results found for this basename: AMMONIA,  in the last 168 hours CBC:  Recent Labs Lab 02/05/13 1452 02/05/13 2049 02/06/13 0247  WBC 9.0 9.0 8.6  HGB 13.9 13.3 12.9  HCT 41.4 40.5 38.7  MCV 91.6 91.4 91.5  PLT 222 196 199   Cardiac Enzymes:  Recent Labs Lab 02/05/13 2049 02/06/13 0247 02/06/13 0740  CKTOTAL 79 75 90  CKMB 2.1 2.1 1.9  TROPONINI <0.30 <0.30 <0.30   BNP: BNP (last 3 results) No results found for this basename: PROBNP,  in the last 8760 hours CBG: No results found for this basename: GLUCAP,  in the last 168 hours  Time coordinating discharge: 45 minutes  Signed:  Lonya Johannesen  [facility] 02/06/2013, 11:18 AM

## 2013-02-06 NOTE — Progress Notes (Signed)
Pt provided with dc instructions and education. Pt verbalized understanding. No question at this time. Education provided on new medications and how/when to take them. Teachback received from patient. IV removed with tip intact. Heart monitor cleaned and returned to front. Levonne Spiller, RN

## 2013-02-06 NOTE — Consult Note (Signed)
CARDIOLOGY CONSULT NOTE  Patient ID: Kiara Little MRN: [id number], DOB/AGE: Oct 23, 1935   Admit date: 02/05/2013 Date of Consult: 02/06/2013  Primary Physician: Dorrene German, MD Primary Cardiologist: new - J. Kensy Blizard, MD   Pt. Profile  77 y/o female without prior cardiac history who presented yesterday with chest pain.  Problem List  Past Medical History  Diagnosis Date  . Arthritis   . Muscle spasm   . Hypertension   . Tobacco abuse   . Noncompliance   . Chest pain   . Osteoarthritis     a. s/p R THA    Past Surgical History  Procedure Laterality Date  . Back surgery    . Hip surgery      ~ 15 yrs ago    Allergies  No Known Allergies  HPI   77 y/o female without prior h/o CAD.  She thinks that she may have had a stress test at [facility] many years ago but has never had a further cardiac work-up.  She has a h/o HTN but has not been on meds in many years.  She was in her usoh until yesterday, when approx 30 mins after eating brunch at [employer], she was at a friends house and developed chest tightness without associated Ss.  She presented to her PCP's office, whom she had already had a scheduled appt, but she did not have money for a co-pay and so left and went to the ED.  Upon arrival, she was hypertensive.  She was treated with asa.  Chest pain resolved within 40 mins of onset.  ECG was nonacute and initial troponin negative.  She was admitted for r/o.  She has had no further chest tightness. CE negative.  She wishes to go home and says clearly that she is not interested in either stress testing or cath at this time but may be willing to undergo outpatient stress testing.  Inpatient Medications  . aspirin EC  81 mg Oral Daily  . calcium carbonate  1 tablet Oral Q breakfast  . enoxaparin (LOVENOX) injection  40 mg Subcutaneous Q24H  . hydrochlorothiazide  12.5 mg Oral Daily  . lisinopril  10 mg Oral Daily  . nicotine  21 mg Transdermal Daily  . sodium  chloride  3 mL Intravenous Q12H    Family History Family History  Problem Relation Age of Onset  . Heart attack Mother     died @ 29  . Diabetes Father     died in his 3's  . Cancer Brother     deceased    Social History History   Social History  . Marital Status: Legally Separated    Spouse Name: N/A    Number of Children: N/A  . Years of Education: N/A   Occupational History  . Not on file.   Social History Main Topics  . Smoking status: Current Every Day Smoker -- 0.50 packs/day for 40 years  . Smokeless tobacco: Not on file  . Alcohol Use: No  . Drug Use: No  . Sexually Active: Yes    Birth Control/ Protection: None   Other Topics Concern  . Not on file   Social History Narrative   Pt lives in [place] with her son.  She is retired.  She does not exercise.    Review of Systems  General:  No chills, fever, night sweats or weight changes.  Cardiovascular:  +++ chest pain/tightness yesterday.  No dyspnea on exertion, edema, orthopnea, palpitations, paroxysmal  nocturnal dyspnea. Dermatological: No rash, lesions/masses Respiratory: No cough, dyspnea Urologic: No hematuria, dysuria Abdominal:   No nausea, vomiting, diarrhea, bright red blood per rectum, melena, or hematemesis Neurologic:  No visual changes, wkns, changes in mental status. All other systems reviewed and are otherwise negative except as noted above.  Physical Exam  Blood pressure 128/73, pulse 52, temperature 98.4 F (36.9 C), temperature source Oral, resp. rate 18, height 5\' 9"  (1.753 m), weight 193 lb (87.544 kg), SpO2 96.00%.  General: Pleasant, NAD Psych: Normal affect. Neuro: Alert and oriented X 3. Moves all extremities spontaneously. HEENT: Normal  Neck: Supple without bruits or JVD. Lungs:  Resp regular and unlabored, CTA. Heart: RRR distant, no s3, s4, or murmurs. Abdomen: Soft, non-tender, non-distended, BS + x 4.  Extremities: No clubbing, cyanosis or edema. DP/PT/Radials 2+ and  equal bilaterally.  Labs   Recent Labs  02/05/13 2049 02/06/13 0247 02/06/13 0740  CKTOTAL 79 75 90  CKMB 2.1 2.1 1.9  TROPONINI <0.30 <0.30 <0.30   Lab Results  Component Value Date   WBC 8.6 02/06/2013   HGB 12.9 02/06/2013   HCT 38.7 02/06/2013   MCV 91.5 02/06/2013   PLT 199 02/06/2013    Recent Labs Lab 02/05/13 1452  02/06/13 0247  NA 139  --  138  K 3.8  --  3.5  CL 103  --  102  CO2 24  --  26  BUN 18  --  18  CREATININE 0.98  < > 0.91  CALCIUM 9.3  --  9.2  PROT 7.9  --   --   BILITOT 0.4  --   --   ALKPHOS 68  --   --   ALT 12  --   --   AST 25  --   --   GLUCOSE 84  --  93  < > = values in this interval not displayed. Lab Results  Component Value Date   CHOL 194 02/06/2013   HDL 43 02/06/2013   LDLCALC 133* 02/06/2013   TRIG 91 02/06/2013   Radiology/Studies  Dg Chest 2 View  02/05/2013   *RADIOLOGY REPORT*  Clinical Data: Chest pain, history hypertension, smoking  CHEST - 2 VIEW   IMPRESSION: Minimal enlargement of cardiac silhouette. Emphysematous changes question COPD. No acute abnormalities.   Original Report Authenticated By: Ulyses Southward, M.D.   ECG  Rsr, 76, pac, no acute st/t changes.  ASSESSMENT AND PLAN  1.  Chest pain/tightness:  Pt presented with chest tightness w/o associated Ss after eating @ [employer].  Ss resolved spontaneously.  She is w/o obj evidence of ischemia.  HEART score of 4 (12.5% risk of MACE). We discussed the role of stress testing and she is not interested at this time.  She wishes to be discharged and understands that if she were to have more chest pain, that she should present back to the ED.  Cont asa and antihypertensive regimen.  10 yr CVD risk of 25.7%, thus would also initiate moderate dose statin.  Stressed importance of compliance.  2.  HTN:  BP improved with addition of lisinopril/hctz.  Will require outpt primary care f/u.  3.  HL:  LDL 133 with 10 yr CVD risk of 25.7%.  Add statin.  4.  Tob Abuse:  Cessation  advised.  Signed, Nicolasa Ducking, NP 02/06/2013, 9:05 AM  History and all data above reviewed.  Patient examined.  I agree with the findings as above.  Her chest pain is somewhat  atypical  However she is greater than 39 years of age and has greater than 3 risk factors for obstructive CAD.  The patient exam reveals COR:RRR  ,  Lungs: Clear  ,  Abd: Positive bowel sounds, no rebound no guarding, Ext No edema  .  All available labs, radiology testing, previous records reviewed. Agree with documented assessment and plan. As above HEART score is 4.  Stress testing is indicated and she wishes to have this as an outpatient.  She needs aggressive risk reduction.    Fayrene Fearing Pooja Camuso  12:50 PM  02/06/2013

## 2013-02-06 NOTE — Care Management Note (Signed)
    Page 1 of 1   02/06/2013     10:48:05 AM   CARE MANAGEMENT NOTE 02/06/2013  Patient:  Kiara Little, Kiara Little   Account Number:  [id number]  Date Initiated:  02/06/2013  Documentation initiated by:  GRAVES-BIGELOW,Arthur Aydelotte  Subjective/Objective Assessment:   Pt admitted with cp. Refusing stress test here. Plan to return home today. Pt is from home with son.     Action/Plan:   CM provided pt with [facility] # and she will call for PCP. Pt states she is able to afford medications. No HH needs at this time. Her insurance will send Little CM out to her home to assess her home situation and needs.   Anticipated DC Date:  02/06/2013   Anticipated DC Plan:  HOME/SELF CARE      DC Planning Services  CM consult      Choice offered to / List presented to:             Status of service:  Completed, signed off Medicare Important Message given?   (If response is "NO", the following Medicare IM given date fields will be blank) Date Medicare IM given:   Date Additional Medicare IM given:    Discharge Disposition:  HOME/SELF CARE  Per UR Regulation:  Reviewed for med. necessity/level of care/duration of stay  If discussed at Long Length of Stay Meetings, dates discussed:    Comments:

## 2013-02-06 NOTE — Progress Notes (Signed)
[facility] PROGRESS NOTE  Kiara Little ZOX:[id number] DOB: 1936/04/15 DOA: 02/05/2013 PCP: Dorrene German, MD  Assessment/Plan  Chest pain, atypical without SOB, nausea, diaphoresis.  Had some LH.  Risk factors of HTN and tobacco - Tele:  Sinus brady -  ECG NSR - troponins neg -  Cardiology consult for possible stress test -  Continue daily ASA -  Consider addition of statin -  No beta blocker due to bradycardia  HTN:  Blood pressure wnl this morning  -  Continue ACEI/HCTZ  Lab Results  Component Value Date   CHOL 194 02/06/2013   HDL 43 02/06/2013   LDLCALC 133* 02/06/2013   TRIG 91 02/06/2013   CHOLHDL 4.5 02/06/2013  -  Consider addition of statin  Cigarette dependence with withdrawal:  Continue nicotine patch -  Counseled cessation  Diet:  NPO pending cardiology assessment Access:  PIV IVF:  OFF Proph:  lovenox  Code Status: full Family Communication: spoke with aptient alone Disposition Plan: pending cardiology assessment   Consultants:  cards  Procedures:  none  Antibiotics:  none   HPI/Subjective:  Symptom free currently.  Denies CP/SOB, N/V, LH, diaphoresis  Objective: Filed Vitals:   02/05/13 1904 02/05/13 2215 02/05/13 2235 02/06/13 0635  BP: 182/105 144/82 155/89 128/73  Pulse: 53 56  52  Temp:  98.3 F (36.8 C)  98.4 F (36.9 C)  TempSrc:  Oral  Oral  Resp: 18     Height:   5\' 9"  (1.753 m)   Weight:   87.544 kg (193 lb)   SpO2: 98% 99%  96%   No intake or output data in the 24 hours ending 02/06/13 0843 Filed Weights   02/05/13 2235  Weight: 87.544 kg (193 lb)    Exam:   General:  AAF, No acute distress  HEENT:  NCAT, MMM  Cardiovascular:  RRR, nl S1, S2 no mrg, 2+ pulses, warm extremities  Respiratory:  CTAB, no increased WOB  Abdomen:   NABS, soft, NT/ND  MSK:   Normal tone and bulk, no LEE  Neuro:  Grossly intact  Data Reviewed: Basic Metabolic Panel:  Recent Labs Lab 02/05/13 1452 02/05/13 2049  02/06/13 0247  NA 139  --  138  K 3.8  --  3.5  CL 103  --  102  CO2 24  --  26  GLUCOSE 84  --  93  BUN 18  --  18  CREATININE 0.98 0.77 0.91  CALCIUM 9.3  --  9.2   Liver Function Tests:  Recent Labs Lab 02/05/13 1452  AST 25  ALT 12  ALKPHOS 68  BILITOT 0.4  PROT 7.9  ALBUMIN 3.8   No results found for this basename: LIPASE, AMYLASE,  in the last 168 hours No results found for this basename: AMMONIA,  in the last 168 hours CBC:  Recent Labs Lab 02/05/13 1452 02/05/13 2049 02/06/13 0247  WBC 9.0 9.0 8.6  HGB 13.9 13.3 12.9  HCT 41.4 40.5 38.7  MCV 91.6 91.4 91.5  PLT 222 196 199   Cardiac Enzymes:  Recent Labs Lab 02/05/13 2049 02/06/13 0247 02/06/13 0740  CKTOTAL 79 75 PENDING  CKMB 2.1 2.1 1.9  TROPONINI <0.30 <0.30 <0.30   BNP (last 3 results) No results found for this basename: PROBNP,  in the last 8760 hours CBG: No results found for this basename: GLUCAP,  in the last 168 hours  No results found for this or any previous visit (from the past 240 hour(s)).  Studies: Dg Chest 2 View  02/05/2013   *RADIOLOGY REPORT*  Clinical Data: Chest pain, history hypertension, smoking  CHEST - 2 VIEW  Comparison: None  Findings: Enlargement of cardiac silhouette. Calcified tortuous aorta. Mediastinal contours and pulmonary vascularity otherwise normal. Emphysematous changes without infiltrate, pleural effusion or pneumothorax. No acute osseous findings.  IMPRESSION: Minimal enlargement of cardiac silhouette. Emphysematous changes question COPD. No acute abnormalities.   Original Report Authenticated By: Ulyses Southward, M.D.    Scheduled Meds: . aspirin EC  81 mg Oral Daily  . calcium carbonate  1 tablet Oral Q breakfast  . enoxaparin (LOVENOX) injection  40 mg Subcutaneous Q24H  . hydrochlorothiazide  12.5 mg Oral Daily  . lisinopril  10 mg Oral Daily  . nicotine  21 mg Transdermal Daily  . sodium chloride  3 mL Intravenous Q12H   Continuous Infusions:    Principal Problem:   Chest pain, atypical Active Problems:   HTN (hypertension)   Anxiety   Nicotine abuse    Time spent: 30 min    Festus Pursel, [facility]  [facility] Pager [phone]. If 7PM-7AM, please contact night-coverage at [email or web address], password [facility] 02/06/2013, 8:43 AM  LOS: 1 day

## 2013-02-06 NOTE — Progress Notes (Signed)
UR Completed Ara Grandmaison Graves-Bigelow, RN,BSN [phone]  

## 2013-02-07 NOTE — ED Provider Notes (Signed)
I have personally seen and examined the patient.  I have discussed the plan of care with the resident.  I have reviewed the documentation on PMH/FH/Soc. History.  I have reviewed the documentation of the resident and agree.   Date: 02/05/2013  Rate: 76  Rhythm: normal sinus rhythm  QRS Axis: normal  Intervals: normal  ST/T Wave abnormalities: nonspecific ST changes  Conduction Disutrbances:none    Joya Gaskins, MD 02/07/13 [phone]

## 2013-02-10 ENCOUNTER — Other Ambulatory Visit: Payer: Self-pay | Admitting: *Deleted

## 2013-02-10 DIAGNOSIS — R0602 Shortness of breath: Secondary | ICD-10-CM

## 2013-02-11 ENCOUNTER — Encounter (HOSPITAL_COMMUNITY): Payer: Self-pay

## 2013-02-11 ENCOUNTER — Ambulatory Visit (HOSPITAL_COMMUNITY)
Admit: 2013-02-11 | Discharge: 2013-02-11 | Disposition: A | Discharge status: Home / Self Care | Payer: PRIVATE HEALTH INSURANCE | Source: Ambulatory Visit | Attending: Nurse Practitioner | Admitting: Nurse Practitioner

## 2013-02-11 ENCOUNTER — Encounter (HOSPITAL_COMMUNITY)
Admission: RE | Admit: 2013-02-11 | Discharge: 2013-02-11 | Disposition: A | Discharge status: Home / Self Care | Payer: PRIVATE HEALTH INSURANCE | Source: Ambulatory Visit | Attending: Nurse Practitioner | Admitting: Nurse Practitioner

## 2013-02-11 DIAGNOSIS — R079 Chest pain, unspecified: Secondary | ICD-10-CM

## 2013-02-11 DIAGNOSIS — R0602 Shortness of breath: Secondary | ICD-10-CM

## 2013-02-11 DIAGNOSIS — R0789 Other chest pain: Secondary | ICD-10-CM | POA: Insufficient documentation

## 2013-02-11 MED ORDER — REGADENOSON 0.4 MG/5ML IV SOLN
INTRAVENOUS | Status: AC
  Administered 2013-02-11: 0.4 mg via INTRAVENOUS
  Filled 2013-02-11: qty 5

## 2013-02-11 MED ORDER — TECHNETIUM TC 99M SESTAMIBI - CARDIOLITE
30.0000 | Freq: Once | INTRAVENOUS | Status: AC | PRN
  Administered 2013-02-11: 11:00:00 30 via INTRAVENOUS

## 2013-02-11 MED ORDER — TECHNETIUM TC 99M SESTAMIBI - CARDIOLITE
10.0000 | Freq: Once | INTRAVENOUS | Status: AC | PRN
  Administered 2013-02-11: 10.8 via INTRAVENOUS

## 2013-02-11 MED ORDER — SODIUM CHLORIDE 0.9 % IJ SOLN
INTRAMUSCULAR | Status: AC
  Administered 2013-02-11: 10 mL via INTRAVENOUS
  Filled 2013-02-11: qty 10

## 2013-02-11 NOTE — Progress Notes (Signed)
Stress Lab Nurses Notes - Girtie Wiersma A Kondo 02/11/2013 Reason for doing test: Chest Pain Type of test: Marlane Hatcher Nurse performing test: Parke Poisson, RN Nuclear Medicine Tech: Lou Cal Echo Tech: Not Applicable MD performing test: R. Rothbart & Joni Reining NP Family MD:  Dr. Fleet Contras Test explained and consent signed: yes IV started: 22g jelco, Saline lock flushed, No redness or edema and Saline lock started in radiology Symptoms: Stomach discomfort Treatment/Intervention: None Reason test stopped: protocol completed After recovery IV was: Discontinued via X-ray tech and No redness or edema Patient to return to Nuc. Med at : 11:45 Patient discharged: Home Patient's Condition upon discharge was: stable Comments: During test BP 146/84 & HR 90.  Recovery BP 145/80 & HR 75.  Symptoms resolved in recovery.  Erskine Speed T

## 2013-02-19 ENCOUNTER — Ambulatory Visit (INDEPENDENT_AMBULATORY_CARE_PROVIDER_SITE_OTHER): Payer: PRIVATE HEALTH INSURANCE | Admitting: Adult Health

## 2013-02-19 ENCOUNTER — Encounter: Payer: Self-pay | Admitting: Adult Health

## 2013-02-19 VITALS — BP 126/83 | HR 78 | Ht 69.0 in | Wt 188.0 lb

## 2013-02-19 DIAGNOSIS — I10 Essential (primary) hypertension: Secondary | ICD-10-CM

## 2013-02-19 DIAGNOSIS — R0789 Other chest pain: Secondary | ICD-10-CM

## 2013-02-19 NOTE — Assessment & Plan Note (Signed)
Stress test is completed and found to be negative for ischemia. No further cardiac work up is warranted unless symptoms are persistent and worsen. She is given reassurance.

## 2013-02-19 NOTE — Assessment & Plan Note (Signed)
Good control of BP at present.  She is tolerating the amlodipine well. Continue current medications. See prn.

## 2013-02-19 NOTE — Progress Notes (Signed)
   HPI: Kiara Little is a 77 year old patient of Dr. Dietrich Pates we see for ongoing assessment treatment of chest pain. The patient was recently hospitalized with hypertension and anxiety. A followup stress Myoview was completed as an outpatient demonstrating negative for ischemia. Normal left ventricular size, normal left ventricle systolic function, EF of 62%. She was noted to be mildly bradycardic with a heart rate of 55 beats per minute.   She states that her medications have been changed from lisinopril to amlodipine 5 mg daily as she is intolerant to ACE causing itching.   Current Outpatient Prescriptions  Medication Sig Dispense Refill  . amLODipine (NORVASC) 5 MG tablet Take 5 mg by mouth daily.      Marland Kitchen aspirin EC 81 MG EC tablet Take 1 tablet (81 mg total) by mouth daily.      . calcium carbonate (OS-CAL) 600 MG TABS Take 600 mg by mouth daily.       . naproxen sodium (ANAPROX) 220 MG tablet Take 440 mg by mouth 2 (two) times daily with a meal.      . nicotine (NICODERM CQ - DOSED IN MG/24 HOURS) 21 mg/24hr patch Place 1 patch onto the skin daily. NOT STARTED YET       No current facility-administered medications for this visit.    Past Medical History  Diagnosis Date  . Arthritis   . Muscle spasm   . Hypertension   . Tobacco abuse   . Noncompliance   . Chest pain   . Osteoarthritis     a. s/p R THA    Past Surgical History  Procedure Laterality Date  . Back surgery    . Hip surgery      ~ 15 yrs ago    GUY:QIHKVQ of systems complete and found to be negative unless listed above  PHYSICAL EXAM General: Well developed, well nourished, in no acute distress Head: Eyes PERRLA, No xanthomas.   Normal cephalic and atramatic  Lungs: Clear bilaterally to auscultation and percussion. Heart: HRRR S1 S2, without MRG.  Pulses are 2+ & equal.            No carotid bruit. No JVD.  No abdominal bruits. No femoral bruits. Abdomen: Bowel sounds are positive, abdomen soft and non-tender  without masses or                  Hernia's noted. Msk:  Back normal, normal gait. Normal strength and tone for age. Extremities: No clubbing, cyanosis or edema.  DP +1 Neuro: Alert and oriented X 3. Psych:  Good affect, responds appropriately  ASSESSMENT AND PLAN

## 2013-07-08 ENCOUNTER — Other Ambulatory Visit (HOSPITAL_COMMUNITY): Payer: Self-pay | Admitting: Orthopaedic Surgery

## 2013-07-10 ENCOUNTER — Encounter (HOSPITAL_COMMUNITY): Payer: Self-pay | Admitting: Pharmacy Technician

## 2013-07-14 ENCOUNTER — Encounter (HOSPITAL_COMMUNITY): Payer: Self-pay

## 2013-07-14 ENCOUNTER — Encounter (HOSPITAL_COMMUNITY)
Admission: RE | Admit: 2013-07-14 | Discharge: 2013-07-14 | Disposition: A | Discharge status: Home / Self Care | Payer: PRIVATE HEALTH INSURANCE | Source: Ambulatory Visit | Attending: Orthopaedic Surgery | Admitting: Orthopaedic Surgery

## 2013-07-14 HISTORY — DX: Pain, unspecified: R52

## 2013-07-14 LAB — URINALYSIS, ROUTINE W REFLEX MICROSCOPIC
Ketones, ur: NEGATIVE mg/dL
Nitrite: NEGATIVE
Protein, ur: NEGATIVE mg/dL
Urobilinogen, UA: 0.2 mg/dL (ref 0.0–1.0)
pH: 5 (ref 5.0–8.0)

## 2013-07-14 LAB — BASIC METABOLIC PANEL
Calcium: 10.1 mg/dL (ref 8.4–10.5)
GFR calc non Af Amer: 69 mL/min — ABNORMAL LOW (ref >90)
Glucose, Bld: 83 mg/dL (ref 70–99)
Sodium: 136 mEq/L (ref 135–145)

## 2013-07-14 LAB — URINE MICROSCOPIC-ADD ON

## 2013-07-14 LAB — CBC
MCH: 29.6 pg (ref 26.0–34.0)
Platelets: 279 10*3/uL (ref 150–400)
RBC: 4.25 MIL/uL (ref 3.87–5.11)
RDW: 14.9 % (ref 11.5–15.5)

## 2013-07-14 LAB — SURGICAL PCR SCREEN: Staphylococcus aureus: POSITIVE — AB

## 2013-07-14 NOTE — Pre-Procedure Instructions (Addendum)
CXR AND EKG REPORTS ARE IN EPIC FROM 02/05/13. NUCLEAR STRESS TEST REPORT IN EPIC FROM 02/16/13. CARDIOLOGY OFFICE NOTE IN EPIC FROM K. LAWRENCE NP 02/19/13

## 2013-07-14 NOTE — Patient Instructions (Addendum)
YOUR SURGERY IS SCHEDULED AT [facility]  ON:  Friday  10/31  REPORT TO [facility]AY [facility] AT:  12:00 PM      PHONE # FOR SHORT STAY IS [phone]  DO NOT EAT  ANYTHING AFTER MIDNIGHT THE NIGHT BEFORE YOUR SURGERY.  NO FOOD, NO CHEWING GUM, NO MINTS, NO CANDIES, NO CHEWING TOBACCO. YOU MAY HAVE CLEAR LIQUIDS TO DRINK FROM MIDNIGHT UNTIL 8:00 AM MORNING OF SURGERY - LIKE WATER, SPRITE.    NOTHING TO DRINK AFTER 8:00 AM DAY OF YOUR SURGERY.  PLEASE TAKE THE FOLLOWING MEDICATIONS THE AM OF YOUR SURGERY WITH A FEW SIPS OF WATER:  AMLODIPINE ( NORVASC ), GABAPENTIN ( NEURONTIN ).   DO NOT BRING VALUABLES, MONEY, CREDIT CARDS.  DO NOT WEAR JEWELRY, MAKE-UP, NAIL POLISH AND NO METAL PINS OR CLIPS IN YOUR HAIR. CONTACT LENS, DENTURES / PARTIALS, GLASSES SHOULD NOT BE WORN TO SURGERY AND IN MOST CASES-HEARING AIDS WILL NEED TO BE REMOVED.  BRING YOUR GLASSES CASE, ANY EQUIPMENT NEEDED FOR YOUR CONTACT LENS. FOR PATIENTS ADMITTED TO THE HOSPITAL--CHECK OUT TIME THE DAY OF DISCHARGE IS 11:00 AM.  ALL INPATIENT ROOMS ARE PRIVATE - WITH BATHROOM, TELEPHONE, TELEVISION AND WIFI INTERNET.                              PLEASE READ OVER ANY  FACT SHEETS THAT YOU WERE GIVEN: MRSA INFORMATION, BLOOD TRANSFUSION INFORMATION FAILURE TO FOLLOW THESE INSTRUCTIONS MAY RESULT IN THE CANCELLATION OF YOUR SURGERY.   PATIENT SIGNATURE_________________________________

## 2013-07-14 NOTE — Pre-Procedure Instructions (Signed)
PT'S URINALYSIS AND URINE MICROSCOPIC REPORTS FAXED TO DR. Alben Spittle OFFICE FOR REVIEW.

## 2013-07-17 ENCOUNTER — Encounter (HOSPITAL_COMMUNITY): Payer: Self-pay | Admitting: Anesthesiology

## 2013-07-17 ENCOUNTER — Ambulatory Visit (HOSPITAL_COMMUNITY)
Admission: RE | Admit: 2013-07-17 | Discharge: 2013-07-17 | DRG: 554 | Disposition: A | Discharge status: Home / Self Care | Payer: PRIVATE HEALTH INSURANCE | Source: Ambulatory Visit | Attending: Orthopaedic Surgery | Admitting: Orthopaedic Surgery

## 2013-07-17 ENCOUNTER — Encounter (HOSPITAL_COMMUNITY): Payer: Self-pay | Admitting: *Deleted

## 2013-07-17 ENCOUNTER — Encounter (HOSPITAL_COMMUNITY)
Admission: RE | Disposition: A | Discharge status: Home / Self Care | Payer: Self-pay | Source: Ambulatory Visit | Attending: Orthopaedic Surgery

## 2013-07-17 DIAGNOSIS — M1610 Unilateral primary osteoarthritis, unspecified hip: Secondary | ICD-10-CM | POA: Diagnosis present

## 2013-07-17 DIAGNOSIS — Z8249 Family history of ischemic heart disease and other diseases of the circulatory system: Secondary | ICD-10-CM

## 2013-07-17 DIAGNOSIS — Z96649 Presence of unspecified artificial hip joint: Secondary | ICD-10-CM

## 2013-07-17 DIAGNOSIS — Z833 Family history of diabetes mellitus: Secondary | ICD-10-CM

## 2013-07-17 DIAGNOSIS — M169 Osteoarthritis of hip, unspecified: Secondary | ICD-10-CM

## 2013-07-17 DIAGNOSIS — Z538 Procedure and treatment not carried out for other reasons: Secondary | ICD-10-CM

## 2013-07-17 DIAGNOSIS — F17200 Nicotine dependence, unspecified, uncomplicated: Secondary | ICD-10-CM | POA: Diagnosis present

## 2013-07-17 DIAGNOSIS — I10 Essential (primary) hypertension: Secondary | ICD-10-CM | POA: Diagnosis present

## 2013-07-17 DIAGNOSIS — F411 Generalized anxiety disorder: Secondary | ICD-10-CM | POA: Diagnosis present

## 2013-07-17 LAB — TYPE AND SCREEN: Antibody Screen: POSITIVE

## 2013-07-17 SURGERY — ARTHROPLASTY, HIP, TOTAL, ANTERIOR APPROACH
Anesthesia: Choice

## 2013-07-17 MED ORDER — MUPIROCIN 2 % EX OINT
TOPICAL_OINTMENT | CUTANEOUS | Status: AC
  Filled 2013-07-17: qty 22

## 2013-07-17 MED ORDER — MUPIROCIN 2 % EX OINT
TOPICAL_OINTMENT | Freq: Two times a day (BID) | CUTANEOUS | Status: DC
  Administered 2013-07-17: 1 via NASAL

## 2013-07-17 MED ORDER — CEFAZOLIN SODIUM-DEXTROSE 2-3 GM-% IV SOLR: 2.0000 g | INTRAVENOUS | Status: DC

## 2013-07-17 NOTE — Progress Notes (Signed)
Preop in Short Stay. Dr Magnus Ivan here to see patient and confirmed that surgery was canceled. Assisted patient with getting dressed and to car via w/c accompanied by staff to meet family at car

## 2013-07-17 NOTE — H&P (Signed)
TOTAL HIP ADMISSION H&P  Patient is admitted for left total hip arthroplasty.  Subjective:  Chief Complaint: left hip pain  HPI: Kiara Little, 77 y.o. female, has a history of pain and functional disability in the left hip(s) due to arthritis and patient has failed non-surgical conservative treatments for greater than 12 weeks to include NSAID's and/or analgesics, flexibility and strengthening excercises, use of assistive devices and activity modification.  Onset of symptoms was gradual starting 3 years ago with gradually worsening course since that time.The patient noted no past surgery on the left hip(s).  Patient currently rates pain in the left hip at 10 out of 10 with activity. Patient has night pain, worsening of pain with activity and weight bearing, trendelenberg gait, pain that interfers with activities of daily living and pain with passive range of motion. Patient has evidence of subchondral sclerosis, periarticular osteophytes and joint space narrowing by imaging studies. This condition presents safety issues increasing the risk of falls.  There is no current active infection.  Patient Active Problem List   Diagnosis Date Noted  . Degenerative arthritis of left hip 07/17/2013  . HTN (hypertension) 02/05/2013  . Chest pain, atypical 02/05/2013  . Anxiety 02/05/2013  . Nicotine abuse 02/05/2013   Past Medical History  Diagnosis Date  . Muscle spasm   . Hypertension   . Tobacco abuse   . Noncompliance   . Chest pain   . Pain     BOTH FEET "TIGHTNESS" "NO NERVE PAIN" - TAKES GABAPENTIN  . Arthritis     OA LEFT HIP   . Osteoarthritis     a. s/p R THA    Past Surgical History  Procedure Laterality Date  . Back surgery    . Joint replacement  2000    RIGHT TOTAL HIP ARTHROPLASTY  . Surgery for tubal pregnancy      No prescriptions prior to admission   Allergies  Allergen Reactions  . Ace Inhibitors Itching  . Other     SHELLFISH / SHRIMP - SWELLING MOUTH     History  Substance Use Topics  . Smoking status: Current Every Day Smoker -- 0.50 packs/day for 40 years    Types: Cigarettes  . Smokeless tobacco: Not on file  . Alcohol Use: No    Family History  Problem Relation Age of Onset  . Heart attack Mother     died @ 54  . Diabetes Father     died in his 35's  . Cancer Brother     deceased     Review of Systems  Musculoskeletal: Positive for joint pain.  All other systems reviewed and are negative.    Objective:  Physical Exam  Constitutional: She is oriented to person, place, and time. She appears well-developed and well-nourished.  HENT:  Head: Normocephalic and atraumatic.  Eyes: EOM are normal. Pupils are equal, round, and reactive to light.  Neck: Normal range of motion. Neck supple.  Cardiovascular: Normal rate and regular rhythm.   Respiratory: Effort normal and breath sounds normal.  GI: Soft. Bowel sounds are normal.  Musculoskeletal:       Left hip: She exhibits decreased range of motion, decreased strength and bony tenderness.  Neurological: She is alert and oriented to person, place, and time.  Skin: Skin is warm and dry.  Psychiatric: She has a normal mood and affect.    Vital signs in last 24 hours:    Labs:   Estimated body mass index is 27.76 kg/(m^2)  as calculated from the following:   Height as of 02/19/13: 5\' 9"  (1.753 m).   Weight as of 02/19/13: 85.294 kg (188 lb 0.6 oz).   Imaging Review Plain radiographs demonstrate severe degenerative joint disease of the left hip(s). The bone quality appears to be good for age and reported activity level.  Assessment/Plan:  End stage arthritis, left hip(s)  The patient history, physical examination, clinical judgement of the provider and imaging studies are consistent with end stage degenerative joint disease of the left hip(s) and total hip arthroplasty is deemed medically necessary. The treatment options including medical management, injection therapy,  arthroscopy and arthroplasty were discussed at length. The risks and benefits of total hip arthroplasty were presented and reviewed. The risks due to aseptic loosening, infection, stiffness, dislocation/subluxation,  thromboembolic complications and other imponderables were discussed.  The patient acknowledged the explanation, agreed to proceed with the plan and consent was signed. Patient is being admitted for inpatient treatment for surgery, pain control, PT, OT, prophylactic antibiotics, VTE prophylaxis, progressive ambulation and ADL's and discharge planning.The patient is planning to be discharged to skilled nursing facility

## 2013-07-17 NOTE — Progress Notes (Signed)
Patient was informed that surgery would not be done today due to No Approval from insurance. Patient 's family is calling Cordelia Pen in office

## 2013-07-17 NOTE — Progress Notes (Signed)
Office called and stated that the insurance has not been approved for surgery. Patient left in SS

## 2013-07-20 ENCOUNTER — Encounter (HOSPITAL_COMMUNITY): Payer: Self-pay | Admitting: *Deleted

## 2013-07-20 NOTE — Progress Notes (Signed)
PT WAS SCHEDULED FOR THIS SURGERY ON 10/31 - SHE HAD PREOP WORK UP AND LABS ON 10/28 -- BUT SURGERY CANCELLED ON 10/31 BECAUSE INSURANCE HAD NOT APPROVED.  SURGERY NOW RESCHEDULED FOR 11/7.  I SPOKE WITH PT BY PHONE - SHE STATES HER INSURANCE HAS APPROVED SURGERY.  PREOP INSTRUCTIONS REVIEWED WITH PT - SHE STILL HAS THE CHLORHEXIDINE TO SHOWER WITH AND INSTRUCTIONS / PRECAUTIONS.  PT MADE AWARE SHE WILL NEED T/S REPEATED DAY OF SURGERY.

## 2013-07-20 NOTE — Discharge Summary (Signed)
  Ms. Amendola was discharged from Short Stay without having surgery because her insurance apporval for surgery was "insurance pending" and not approved at the time of surgery.

## 2013-07-21 LAB — TYPE AND SCREEN
ABO/RH(D): B POS
DAT, IgG: NEGATIVE
Unit division: 0

## 2013-07-22 ENCOUNTER — Other Ambulatory Visit (HOSPITAL_COMMUNITY): Payer: Self-pay | Admitting: Orthopaedic Surgery

## 2013-07-24 ENCOUNTER — Encounter (HOSPITAL_COMMUNITY): Payer: PRIVATE HEALTH INSURANCE | Admitting: Anesthesiology

## 2013-07-24 ENCOUNTER — Inpatient Hospital Stay (HOSPITAL_COMMUNITY)
Admission: RE | Admit: 2013-07-24 | Discharge: 2013-07-27 | DRG: 470 | Disposition: A | Discharge status: Skilled Nursing Facility | Payer: PRIVATE HEALTH INSURANCE | Source: Ambulatory Visit | Attending: Orthopaedic Surgery | Admitting: Orthopaedic Surgery

## 2013-07-24 ENCOUNTER — Inpatient Hospital Stay (HOSPITAL_COMMUNITY): Payer: PRIVATE HEALTH INSURANCE | Admitting: Anesthesiology

## 2013-07-24 ENCOUNTER — Inpatient Hospital Stay (HOSPITAL_COMMUNITY): Payer: PRIVATE HEALTH INSURANCE

## 2013-07-24 ENCOUNTER — Encounter (HOSPITAL_COMMUNITY)
Admission: RE | Disposition: A | Discharge status: Skilled Nursing Facility | Payer: Self-pay | Source: Ambulatory Visit | Attending: Orthopaedic Surgery

## 2013-07-24 ENCOUNTER — Encounter (HOSPITAL_COMMUNITY): Payer: Self-pay | Admitting: *Deleted

## 2013-07-24 DIAGNOSIS — Z9119 Patient's noncompliance with other medical treatment and regimen: Secondary | ICD-10-CM

## 2013-07-24 DIAGNOSIS — M169 Osteoarthritis of hip, unspecified: Secondary | ICD-10-CM | POA: Diagnosis present

## 2013-07-24 DIAGNOSIS — M1610 Unilateral primary osteoarthritis, unspecified hip: Principal | ICD-10-CM | POA: Diagnosis present

## 2013-07-24 DIAGNOSIS — I10 Essential (primary) hypertension: Secondary | ICD-10-CM | POA: Diagnosis present

## 2013-07-24 DIAGNOSIS — F17200 Nicotine dependence, unspecified, uncomplicated: Secondary | ICD-10-CM | POA: Diagnosis present

## 2013-07-24 DIAGNOSIS — Z91199 Patient's noncompliance with other medical treatment and regimen due to unspecified reason: Secondary | ICD-10-CM

## 2013-07-24 HISTORY — PX: TOTAL HIP ARTHROPLASTY: SHX124

## 2013-07-24 SURGERY — ARTHROPLASTY, HIP, TOTAL, ANTERIOR APPROACH
Anesthesia: Spinal | Site: Hip | Laterality: Left | Wound class: Clean

## 2013-07-24 MED ORDER — GABAPENTIN 300 MG PO CAPS
300.0000 mg | ORAL_CAPSULE | Freq: Three times a day (TID) | ORAL | Status: DC
  Administered 2013-07-24 – 2013-07-27 (×8): 300 mg via ORAL
  Filled 2013-07-24 (×10): qty 1

## 2013-07-24 MED ORDER — ONDANSETRON HCL 4 MG/2ML IJ SOLN: 4.0000 mg | Freq: Four times a day (QID) | INTRAMUSCULAR | Status: DC | PRN

## 2013-07-24 MED ORDER — POLYETHYLENE GLYCOL 3350 17 G PO PACK
17.0000 g | PACK | Freq: Every day | ORAL | Status: DC | PRN
  Administered 2013-07-25: 20:00:00 17 g via ORAL

## 2013-07-24 MED ORDER — CEFAZOLIN SODIUM-DEXTROSE 2-3 GM-% IV SOLR
INTRAVENOUS | Status: AC
  Filled 2013-07-24: qty 50

## 2013-07-24 MED ORDER — ADULT MULTIVITAMIN W/MINERALS CH
1.0000 | ORAL_TABLET | Freq: Every day | ORAL | Status: DC
  Administered 2013-07-25 – 2013-07-27 (×3): 1 via ORAL
  Filled 2013-07-24 (×3): qty 1

## 2013-07-24 MED ORDER — OXYCODONE HCL 5 MG PO TABS: 5.0000 mg | ORAL_TABLET | Freq: Once | ORAL | Status: DC | PRN

## 2013-07-24 MED ORDER — ASPIRIN EC 325 MG PO TBEC
325.0000 mg | DELAYED_RELEASE_TABLET | Freq: Two times a day (BID) | ORAL | Status: DC
  Administered 2013-07-25 – 2013-07-27 (×5): 325 mg via ORAL
  Filled 2013-07-24 (×7): qty 1

## 2013-07-24 MED ORDER — FENTANYL CITRATE 0.05 MG/ML IJ SOLN
INTRAMUSCULAR | Status: DC | PRN
  Administered 2013-07-24: 25 ug via INTRAVENOUS
  Administered 2013-07-24: 50 ug via INTRAVENOUS
  Administered 2013-07-24: 25 ug via INTRAVENOUS

## 2013-07-24 MED ORDER — DEXAMETHASONE SODIUM PHOSPHATE 10 MG/ML IJ SOLN
INTRAMUSCULAR | Status: DC | PRN
  Administered 2013-07-24: 10 mg via INTRAVENOUS

## 2013-07-24 MED ORDER — ZOLPIDEM TARTRATE 5 MG PO TABS: 5.0000 mg | ORAL_TABLET | Freq: Every evening | ORAL | Status: DC | PRN

## 2013-07-24 MED ORDER — PROMETHAZINE HCL 25 MG/ML IJ SOLN: 6.2500 mg | INTRAMUSCULAR | Status: DC | PRN

## 2013-07-24 MED ORDER — MENTHOL 3 MG MT LOZG
1.0000 | LOZENGE | OROMUCOSAL | Status: DC | PRN
  Filled 2013-07-24: qty 9

## 2013-07-24 MED ORDER — CEFAZOLIN SODIUM-DEXTROSE 2-3 GM-% IV SOLR
2.0000 g | INTRAVENOUS | Status: AC
  Administered 2013-07-24: 2 g via INTRAVENOUS

## 2013-07-24 MED ORDER — AMLODIPINE BESYLATE 5 MG PO TABS
5.0000 mg | ORAL_TABLET | Freq: Every morning | ORAL | Status: DC
  Administered 2013-07-25 – 2013-07-27 (×3): 5 mg via ORAL
  Filled 2013-07-24 (×3): qty 1

## 2013-07-24 MED ORDER — TRANEXAMIC ACID 100 MG/ML IV SOLN
1000.0000 mg | INTRAVENOUS | Status: AC
  Administered 2013-07-24: 1000 mg via INTRAVENOUS
  Filled 2013-07-24: qty 10

## 2013-07-24 MED ORDER — MEPERIDINE HCL 50 MG/ML IJ SOLN: 6.2500 mg | INTRAMUSCULAR | Status: DC | PRN

## 2013-07-24 MED ORDER — STERILE WATER FOR IRRIGATION IR SOLN
Status: DC | PRN
  Administered 2013-07-24: 3000 mL

## 2013-07-24 MED ORDER — METOCLOPRAMIDE HCL 5 MG/ML IJ SOLN: 5.0000 mg | Freq: Three times a day (TID) | INTRAMUSCULAR | Status: DC | PRN

## 2013-07-24 MED ORDER — ALUM & MAG HYDROXIDE-SIMETH 200-200-20 MG/5ML PO SUSP: 30.0000 mL | ORAL | Status: DC | PRN

## 2013-07-24 MED ORDER — HYDROMORPHONE HCL PF 1 MG/ML IJ SOLN: 0.2500 mg | INTRAMUSCULAR | Status: DC | PRN

## 2013-07-24 MED ORDER — EPHEDRINE SULFATE 50 MG/ML IJ SOLN
INTRAMUSCULAR | Status: DC | PRN
  Administered 2013-07-24: 10 mg via INTRAVENOUS

## 2013-07-24 MED ORDER — CEFAZOLIN SODIUM 1-5 GM-% IV SOLN
1.0000 g | Freq: Four times a day (QID) | INTRAVENOUS | Status: AC
  Administered 2013-07-24 – 2013-07-25 (×2): 1 g via INTRAVENOUS
  Filled 2013-07-24 (×2): qty 50

## 2013-07-24 MED ORDER — HYDROMORPHONE HCL PF 1 MG/ML IJ SOLN
1.0000 mg | INTRAMUSCULAR | Status: DC | PRN
  Administered 2013-07-24 – 2013-07-25 (×2): 1 mg via INTRAVENOUS
  Filled 2013-07-24 (×2): qty 1

## 2013-07-24 MED ORDER — BUPIVACAINE HCL (PF) 0.5 % IJ SOLN
INTRAMUSCULAR | Status: AC
  Filled 2013-07-24: qty 30

## 2013-07-24 MED ORDER — LACTATED RINGERS IV SOLN
INTRAVENOUS | Status: DC
  Administered 2013-07-24: 17:00:00 via INTRAVENOUS
  Administered 2013-07-24: 1000 mL via INTRAVENOUS

## 2013-07-24 MED ORDER — ONDANSETRON HCL 4 MG PO TABS: 4.0000 mg | ORAL_TABLET | Freq: Four times a day (QID) | ORAL | Status: DC | PRN

## 2013-07-24 MED ORDER — DIPHENHYDRAMINE HCL 12.5 MG/5ML PO ELIX: 12.5000 mg | ORAL_SOLUTION | ORAL | Status: DC | PRN

## 2013-07-24 MED ORDER — PROPOFOL INFUSION 10 MG/ML OPTIME
INTRAVENOUS | Status: DC | PRN
  Administered 2013-07-24: 100 ug/kg/min via INTRAVENOUS

## 2013-07-24 MED ORDER — METHOCARBAMOL 500 MG PO TABS
500.0000 mg | ORAL_TABLET | Freq: Four times a day (QID) | ORAL | Status: DC | PRN
  Administered 2013-07-24 – 2013-07-27 (×7): 500 mg via ORAL
  Filled 2013-07-24 (×7): qty 1

## 2013-07-24 MED ORDER — ONDANSETRON HCL 4 MG/2ML IJ SOLN
INTRAMUSCULAR | Status: DC | PRN
  Administered 2013-07-24: 4 mg via INTRAVENOUS

## 2013-07-24 MED ORDER — SODIUM CHLORIDE 0.9 % IR SOLN
Status: DC | PRN
  Administered 2013-07-24: 1000 mL

## 2013-07-24 MED ORDER — METHOCARBAMOL 100 MG/ML IJ SOLN
500.0000 mg | Freq: Four times a day (QID) | INTRAVENOUS | Status: DC | PRN
  Filled 2013-07-24: qty 5

## 2013-07-24 MED ORDER — OXYCODONE HCL 5 MG/5ML PO SOLN
5.0000 mg | Freq: Once | ORAL | Status: DC | PRN
  Filled 2013-07-24: qty 5

## 2013-07-24 MED ORDER — OXYCODONE HCL 5 MG PO TABS
5.0000 mg | ORAL_TABLET | ORAL | Status: DC | PRN
  Administered 2013-07-24: 5 mg via ORAL
  Administered 2013-07-25 – 2013-07-27 (×12): 10 mg via ORAL
  Filled 2013-07-24: qty 1
  Filled 2013-07-24 (×12): qty 2

## 2013-07-24 MED ORDER — MIDAZOLAM HCL 5 MG/5ML IJ SOLN
INTRAMUSCULAR | Status: DC | PRN
  Administered 2013-07-24: 2 mg via INTRAVENOUS

## 2013-07-24 MED ORDER — ACETAMINOPHEN 325 MG PO TABS: 650.0000 mg | ORAL_TABLET | Freq: Four times a day (QID) | ORAL | Status: DC | PRN

## 2013-07-24 MED ORDER — METOCLOPRAMIDE HCL 10 MG PO TABS: 5.0000 mg | ORAL_TABLET | Freq: Three times a day (TID) | ORAL | Status: DC | PRN

## 2013-07-24 MED ORDER — ACETAMINOPHEN 650 MG RE SUPP: 650.0000 mg | Freq: Four times a day (QID) | RECTAL | Status: DC | PRN

## 2013-07-24 MED ORDER — PHENOL 1.4 % MT LIQD: 1.0000 | OROMUCOSAL | Status: DC | PRN

## 2013-07-24 MED ORDER — 0.9 % SODIUM CHLORIDE (POUR BTL) OPTIME
TOPICAL | Status: DC | PRN
  Administered 2013-07-24: 1000 mL

## 2013-07-24 MED ORDER — BUPIVACAINE HCL (PF) 0.5 % IJ SOLN
INTRAMUSCULAR | Status: DC | PRN
  Administered 2013-07-24: 3 mL

## 2013-07-24 MED ORDER — DOCUSATE SODIUM 100 MG PO CAPS
100.0000 mg | ORAL_CAPSULE | Freq: Two times a day (BID) | ORAL | Status: DC
  Administered 2013-07-24 – 2013-07-27 (×6): 100 mg via ORAL

## 2013-07-24 MED ORDER — SODIUM CHLORIDE 0.9 % IV SOLN
INTRAVENOUS | Status: DC
  Administered 2013-07-25 – 2013-07-26 (×2): via INTRAVENOUS

## 2013-07-24 SURGICAL SUPPLY — 45 items
ADH SKN CLS APL DERMABOND .7 (GAUZE/BANDAGES/DRESSINGS)
BAG SPEC THK2 15X12 ZIP CLS (MISCELLANEOUS) ×1
BAG ZIPLOCK 12X15 (MISCELLANEOUS) ×1 IMPLANT
BIT DRILL FLEXIBLE 35 (BIT) ×1
BIT DRILL FLEXIBLE 35MM (BIT) IMPLANT
BLADE SAW SGTL 18X1.27X75 (BLADE) ×2 IMPLANT
CELLS DAT CNTRL 66122 CELL SVR (MISCELLANEOUS) ×1 IMPLANT
DERMABOND ADVANCED (GAUZE/BANDAGES/DRESSINGS)
DERMABOND ADVANCED .7 DNX12 (GAUZE/BANDAGES/DRESSINGS) IMPLANT
DRAPE C-ARM 42X120 X-RAY (DRAPES) ×2 IMPLANT
DRAPE STERI IOBAN 125X83 (DRAPES) ×2 IMPLANT
DRAPE U-SHAPE 47X51 STRL (DRAPES) ×6 IMPLANT
DRILL BIT FLEXIBLE 35MM (BIT) ×2
DRSG AQUACEL AG ADV 3.5X10 (GAUZE/BANDAGES/DRESSINGS) ×2 IMPLANT
DURAPREP 26ML APPLICATOR (WOUND CARE) ×2 IMPLANT
ELECT BLADE TIP CTD 4 INCH (ELECTRODE) ×2 IMPLANT
ELECT REM PT RETURN 9FT ADLT (ELECTROSURGICAL) ×2
ELECTRODE REM PT RTRN 9FT ADLT (ELECTROSURGICAL) ×1 IMPLANT
FACESHIELD LNG OPTICON STERILE (SAFETY) ×9 IMPLANT
GAUZE XEROFORM 1X8 LF (GAUZE/BANDAGES/DRESSINGS) ×1 IMPLANT
GAUZE XEROFORM 5X9 LF (GAUZE/BANDAGES/DRESSINGS) ×2 IMPLANT
GLOVE BIO SURGEON STRL SZ7.5 (GLOVE) ×2 IMPLANT
GLOVE BIOGEL PI IND STRL 8 (GLOVE) ×2 IMPLANT
GLOVE BIOGEL PI INDICATOR 8 (GLOVE) ×2
GLOVE ECLIPSE 8.0 STRL XLNG CF (GLOVE) ×2 IMPLANT
GOWN STRL REIN XL XLG (GOWN DISPOSABLE) ×4 IMPLANT
HANDPIECE INTERPULSE COAX TIP (DISPOSABLE) ×2
HIP FOROUS FEM W/OX HD RS XLPE ×1 IMPLANT
KIT BASIN OR (CUSTOM PROCEDURE TRAY) ×2 IMPLANT
PACK TOTAL JOINT (CUSTOM PROCEDURE TRAY) ×2 IMPLANT
PADDING CAST COTTON 6X4 STRL (CAST SUPPLIES) ×2 IMPLANT
RETRACTOR WND ALEXIS 18 MED (MISCELLANEOUS) ×1 IMPLANT
RTRCTR WOUND ALEXIS 18CM MED (MISCELLANEOUS) ×2
SET HNDPC FAN SPRY TIP SCT (DISPOSABLE) ×1 IMPLANT
STAPLER VISISTAT 35W (STAPLE) ×2 IMPLANT
SUT ETHIBOND NAB CT1 #1 30IN (SUTURE) ×2 IMPLANT
SUT ETHILON 3 0 PS 1 (SUTURE) IMPLANT
SUT MNCRL AB 4-0 PS2 18 (SUTURE) IMPLANT
SUT VIC AB 0 CT1 36 (SUTURE) ×2 IMPLANT
SUT VIC AB 1 CT1 36 (SUTURE) ×3 IMPLANT
SUT VIC AB 2-0 CT1 27 (SUTURE) ×2
SUT VIC AB 2-0 CT1 TAPERPNT 27 (SUTURE) ×1 IMPLANT
TOWEL OR 17X26 10 PK STRL BLUE (TOWEL DISPOSABLE) ×2 IMPLANT
TOWEL OR NON WOVEN STRL DISP B (DISPOSABLE) ×2 IMPLANT
TRAY FOLEY CATH 14FRSI W/METER (CATHETERS) ×2 IMPLANT

## 2013-07-24 NOTE — Anesthesia Procedure Notes (Addendum)
Spinal   Spinal  Patient location during procedure: OR Start time: 07/24/2013 3:15 PM End time: 07/24/2013 3:22 PM Staffing CRNA/Resident: Early Osmond E Performed by: resident/CRNA  Preanesthetic Checklist Completed: patient identified, site marked, surgical consent, pre-op evaluation, timeout performed, IV checked, risks and benefits discussed and monitors and equipment checked Spinal Block Patient position: sitting Prep: Betadine Patient monitoring: heart rate, continuous pulse ox and blood pressure Approach: midline Location: L3-4 Injection technique: single-shot Needle Needle type: Sprotte  Needle gauge: 24 G Needle length: 9 cm Assessment Sensory level: T8 Additional Notes Expiration date of kit checked. Time out prior to procedure. Tolerated well. Clear CSF pre/post injection

## 2013-07-24 NOTE — H&P (Signed)
  See H&P from last Friday 10/31.  She was scheduled for surgery then and was in Short Stay, but her insurance company still did not approve her surgery.  By 5:01 pm Friday, after her surgery had been canceled, the insurance company gave approval for surgery.  Nothing has changed in the last week since her H&P on 07/17/13.  She understands that we will perform a left total hip replacement as well as the risks and benefits involved.  She gives informed consent for proceeding with surgery.

## 2013-07-24 NOTE — Anesthesia Preprocedure Evaluation (Addendum)
Anesthesia Evaluation  Patient identified by MRN, date of birth, ID band Patient awake    Reviewed: Allergy & Precautions, H&P , NPO status , Patient's Chart, lab work & pertinent test results  Airway       Dental  (+) Dental Advisory Given   Pulmonary Current Smoker,          Cardiovascular hypertension, Pt. on medications     Neuro/Psych PSYCHIATRIC DISORDERS Anxiety negative neurological ROS     GI/Hepatic negative GI ROS, Neg liver ROS,   Endo/Other  negative endocrine ROS  Renal/GU negative Renal ROS     Musculoskeletal negative musculoskeletal ROS (+)   Abdominal   Peds  Hematology negative hematology ROS (+)   Anesthesia Other Findings   Reproductive/Obstetrics negative OB ROS                          Anesthesia Physical Anesthesia Plan  ASA: III  Anesthesia Plan: Spinal   Post-op Pain Management:    Induction: Intravenous  Airway Management Planned:   Additional Equipment:   Intra-op Plan:   Post-operative Plan:   Informed Consent: I have reviewed the patients History and Physical, chart, labs and discussed the procedure including the risks, benefits and alternatives for the proposed anesthesia with the patient or authorized representative who has indicated his/her understanding and acceptance.   Dental advisory given  Plan Discussed with: CRNA  Anesthesia Plan Comments:        Anesthesia Quick Evaluation

## 2013-07-24 NOTE — Brief Op Note (Signed)
07/24/2013  5:12 PM  PATIENT:  Kiara Little  77 y.o. female  PRE-OPERATIVE DIAGNOSIS:  Severe osteoarthritis left hip  POST-OPERATIVE DIAGNOSIS:  Severe osteoarthritis left hip  PROCEDURE:  Procedure(s): LEFT TOTAL HIP ARTHROPLASTY ANTERIOR APPROACH (Left)  SURGEON:  Surgeon(s) and Role:    * Kathryne Hitch, MD - Primary  PHYSICIAN ASSISTANT: Maud Deed, PA-C  ANESTHESIA:   spinal  EBL:  Total I/O In: 1100 [I.V.:1100] Out: 300 [Urine:250; Blood:50]  BLOOD ADMINISTERED:none  DRAINS: none   LOCAL MEDICATIONS USED:  NONE  SPECIMEN:  No Specimen  DISPOSITION OF SPECIMEN:  N/A  COUNTS:  YES  TOURNIQUET:  * No tourniquets in log *  DICTATION: .Other Dictation: Dictation Number [id number]  PLAN OF CARE: Admit to inpatient   PATIENT DISPOSITION:  PACU - hemodynamically stable.   Delay start of Pharmacological VTE agent (>24hrs) due to surgical blood loss or risk of bleeding: no

## 2013-07-24 NOTE — Anesthesia Postprocedure Evaluation (Signed)
Anesthesia Post Note  Patient: Kiara Little  Procedure(s) Performed: Procedure(s) (LRB): LEFT TOTAL HIP ARTHROPLASTY ANTERIOR APPROACH (Left)  Anesthesia type: Spinal  Patient location: PACU  Post pain: Pain level controlled  Post assessment: Post-op Vital signs reviewed  Last Vitals: BP 115/62  Pulse 63  Temp(Src) 36.3 C (Oral)  Resp 22  Ht 5\' 9"  (1.753 m)  Wt 182 lb 2 oz (82.611 kg)  BMI 26.88 kg/m2  SpO2 100%  Post vital signs: Reviewed  Level of consciousness: sedated  Complications: No apparent anesthesia complications

## 2013-07-24 NOTE — Preoperative (Signed)
Beta Blockers   Reason not to administer Beta Blockers:Not Applicable 

## 2013-07-24 NOTE — Transfer of Care (Signed)
Immediate Anesthesia Transfer of Care Note  Patient: Kiara Little  Procedure(s) Performed: Procedure(s): LEFT TOTAL HIP ARTHROPLASTY ANTERIOR APPROACH (Left)  Patient Location: PACU  Anesthesia Type:Spinal  Level of Consciousness: awake, alert , oriented and patient cooperative  Airway & Oxygen Therapy: Patient Spontanous Breathing and Patient connected to face mask oxygen  Post-op Assessment: Report given to PACU RN and Post -op Vital signs reviewed and stable  Post vital signs: Reviewed and stable  Complications: No apparent anesthesia complications

## 2013-07-25 LAB — BASIC METABOLIC PANEL
BUN: 10 mg/dL (ref 6–23)
CO2: 26 mEq/L (ref 19–32)
Chloride: 98 mEq/L (ref 96–112)
Creatinine, Ser: 0.77 mg/dL (ref 0.50–1.10)
Glucose, Bld: 126 mg/dL — ABNORMAL HIGH (ref 70–99)
Potassium: 3.9 mEq/L (ref 3.5–5.1)

## 2013-07-25 LAB — CBC
HCT: 34.4 % — ABNORMAL LOW (ref 36.0–46.0)
MCV: 91.2 fL (ref 78.0–100.0)
RDW: 15 % (ref 11.5–15.5)
WBC: 12.9 10*3/uL — ABNORMAL HIGH (ref 4.0–10.5)

## 2013-07-25 NOTE — Evaluation (Signed)
Physical Therapy Evaluation Patient Details Name: Kiara Little MRN: [id number] DOB: 1936-03-11 Today's Date: 07/25/2013 Time: 4098-1191 PT Time Calculation (min): 24 min  PT Assessment / Plan / Recommendation History of Present Illness  s/p DA L  THA  Clinical Impression  Pt will benefit for PT to address deficits below; Plan is for SNF prior to return home    PT Assessment  Patient needs continued PT services    Follow Up Recommendations  SNF    Does the patient have the potential to tolerate intense rehabilitation      Barriers to Discharge        Equipment Recommendations  None recommended by PT    Recommendations for Other Services     Frequency 7X/week    Precautions / Restrictions Precautions Precautions: Fall Restrictions Other Position/Activity Restrictions: WBAT   Pertinent Vitals/Pain 8/10, was premedicated      Mobility  Bed Mobility Bed Mobility: Supine to Sit Supine to Sit: 4: Min assist;3: Mod assist Transfers Transfers: Sit to Stand;Stand to Sit Sit to Stand: 4: Min assist Stand to Sit: 4: Min assist Details for Transfer Assistance: cues for proper hand placement and control of descent Ambulation/Gait Ambulation/Gait Assistance: 4: Min assist;4: Min Government social research officer (Feet): 45 Feet Assistive device: Rolling walker Gait Pattern: Step-to pattern Gait velocity: slow    Exercises Total Joint Exercises Ankle Circles/Pumps: AROM;Both;10 reps Heel Slides: AAROM;Left;10 reps   PT Diagnosis: Difficulty walking  PT Problem List: Decreased range of motion;Decreased activity tolerance;Decreased mobility;Decreased strength;Decreased knowledge of use of DME PT Treatment Interventions: DME instruction;Gait training;Functional mobility training;Therapeutic activities;Therapeutic exercise;Patient/family education     PT Goals(Current goals can be found in the care plan section) Acute Rehab PT Goals Patient Stated Goal: rehab then return to  I Time For Goal Achievement: 07/25/13 Potential to Achieve Goals: Good  Visit Information  Last PT Received On: 07/25/13 Assistance Needed: +1 History of Present Illness: s/p DA L  THA       Prior Functioning  Home Living Family/patient expects to be discharged to:: Skilled nursing facility Additional Comments: grown son at home that is unable to help Prior Function Level of Independence: Independent Communication Communication: No difficulties    Cognition  Cognition Arousal/Alertness: Awake/alert Behavior During Therapy: WFL for tasks assessed/performed Overall Cognitive Status: Within Functional Limits for tasks assessed    Extremity/Trunk Assessment Lower Extremity Assessment Lower Extremity Assessment: LLE deficits/detail LLE Deficits / Details: ankle WFL LLE: Unable to fully assess due to pain   Balance    End of Session PT - End of Session Equipment Utilized During Treatment: Gait belt Activity Tolerance: Patient tolerated treatment well Patient left: in chair;with call bell/phone within reach;with family/visitor present  GP     [facility] 07/25/2013, 1:04 PM

## 2013-07-25 NOTE — Op Note (Signed)
Kiara Little, Kiara Little               ACCOUNT NO.:  [id number]  MEDICAL RECORD NO.:  [id number]  LOCATION:  1615                         FACILITY:  [facility]  PHYSICIAN:  Vanita Panda. Magnus Ivan, M.D.DATE OF BIRTH:  31-May-1936  DATE OF PROCEDURE:  07/24/2013 DATE OF DISCHARGE:                              OPERATIVE REPORT   PREOPERATIVE DIAGNOSIS:  Severe end-stage arthritis and degenerative joint disease, left hip.  POSTOPERATIVE DIAGNOSIS:  Severe end-stage arthritis and degenerative joint disease, left hip.  PROCEDURE:  Left total hip arthroplasty with direct anterior approach.  IMPLANTS:  Smith and Nephew R3 acetabular component size 52, size 36, 0 degree liner, polyethylene liner, apex hole eliminator guide and single 30 mm screw, size 7 standard anthology femoral component, size 36+ 0 Oxinium hip ball.  SURGEON:  Vanita Panda. Magnus Ivan, MD  ASSISTANT:  Maud Deed, PA-C  ANESTHESIA:  Spinal.  BLOOD LOSS:  50 mL.  ANTIBIOTICS:  2 g IV Ancef.  COMPLICATIONS:  None.  INDICATIONS:  Ms. Sedgwick is a very pleasant 77 year old female with debilitating end-stage arthritis involving her left hip.  She has gotten to the point where it affects her activities of daily living.  Her pain is major and daily.  Her mobility is severely limited.  Due to her poor quality of life, she wished to proceed with a total hip arthroplasty. Her x-rays show complete loss of joint space with collapse of the femoral head.  PROCEDURE DESCRIPTION:  After informed consent was obtained, appropriate left hip was marked.  She was brought to the operating room and spinal anesthesia was obtained while she was on her stretcher.  She was then laid in the supine position on the stretcher.  Foley catheter was placed and traction boots were placed on both her feet.  Next, she was placed supine on the Hana fracture table with the perineal post in place and both legs in inline skeletal traction, but no  traction applied.  Her left operative hip was then prepped and draped with DuraPrep and sterile drapes.  A time-out was called and she was identified as correct patient, correct left hip.  I then made an incision just inferior and posterior to the anterior superior iliac spine and carried this obliquely down the leg.  I dissected down to the tensor fascia lata muscle and tensor fascia was divided longitudinally.  I then proceeded with a direct anterior approach to the hip.  A Cobra retractor was placed around the lateral neck and the medial neck.  I cauterized the lateral femoral and circumflex vessels.  I opened up the hip capsule in L-type format with large effusion encountered in the hip.  I then placed Cobra retractor within the hip capsule and made my femoral neck cut just proximal to the lesser trochanter using oscillating saw and completed this with osteotome.  I then placed a corkscrew guide in the femoral head and removed the femoral head in its entirety and found it to be completely devoid of cartilage.  I then placed a Bent Hohmann around the medial acetabular rim and a Cobra retractor laterally.  I cleaned the acetabular debris including remnants of acetabular labrum.  I then began reaming in  2 mm increments from size 42 up to a size 52 with all reamers under direct visualization and the last reamer also under direct fluoroscopy, so I could obtain my depth of reaming, my inclination and anteversion.  Once I was done with this, we placed the real Smith and Nephew R3 acetabular component size 52.  I then placed the apex hole eliminator guide and a single screw to sclerotic bone.  Attention was then turned to the femur with the leg externally rotated to 100 degrees, extended and adducted.  I used a Mueller retractor medially and Hohmann retractor behind the greater trochanter.  I released the joint capsule from medial to lateral direction and then used a rongeur to lateralize and  a box cutting osteotome to enter the femoral canal.  I then began broaching using the anthology femoral broaching system from [employer] and broached from a size 1, broached up to a size 7 stem and had a nice fill to it.  I used a calcar planer and placed a standard neck with a 36+ 0 hip ball.  We reduced this into acetabulum and it was stable with internal and external rotation.  Leg lengths were measured equal.  I dislocated the hip and removed the trial components.  I then placed the real Smith and Nephew and anthology HA coated femoral component size 7 and the real 36+ 0 Oxinium hip ball.  We reduced this back in the acetabulum and was completely stable.  We then copiously irrigated the joint with normal saline solution using pulsatile lavage. We closed the joint capsule with interrupted #1 Ethibond suture followed by running #1 Vicryl in the tensor fascia, 0 Vicryl in deep tissue, 2-0 Vicryl in the subcutaneous tissue, staples on the skin.  An Aquacel dressing was applied.  She was taken off the Hana table and taken to recovery room in stable condition.  All final counts were correct. There were no complications noted.  Of note, Maud Deed, PA-C was present during the entire case and her presence was crucial from the beginning to the end.     Vanita Panda. Magnus Ivan, M.D.     CYB/MEDQ  D:  07/24/2013  T:  07/25/2013  Job:  [id number]

## 2013-07-25 NOTE — Evaluation (Signed)
Occupational Therapy Evaluation Patient Details Name: Kiara Little MRN: [id number] DOB: 10-24-1935 Today's Date: 07/25/2013 Time: 4098-1191 OT Time Calculation (min): 32 min  OT Assessment / Plan / Recommendation History of present illness s/p DA L  THA   Clinical Impression   Began education on AE options and practiced functional transfers to the 3in1. Pt needs verbal cues for safety with RW use. She will benefit from skilled OT services to maximize ADL independence for d/c to next venue.    OT Assessment  Patient needs continued OT Services    Follow Up Recommendations  SNF;Supervision/Assistance - 24 hour    Barriers to Discharge      Equipment Recommendations  None recommended by OT    Recommendations for Other Services    Frequency  Min 2X/week    Precautions / Restrictions Precautions Precautions: Fall Restrictions Weight Bearing Restrictions: No Other Position/Activity Restrictions: WBAT   Pertinent Vitals/Pain 8/10 L hip; informed nursing; reposition, ice    ADL  Eating/Feeding: Simulated;Independent Where Assessed - Eating/Feeding: Bed level Grooming: Simulated;Wash/dry hands;Set up Where Assessed - Grooming: Unsupported sitting Upper Body Bathing: Simulated;Chest;Right arm;Left arm;Abdomen;Set up Where Assessed - Upper Body Bathing: Unsupported sitting Lower Body Bathing: Simulated;Moderate assistance Where Assessed - Lower Body Bathing: Supported sit to stand Upper Body Dressing: Simulated;Set up Where Assessed - Upper Body Dressing: Unsupported sitting Lower Body Dressing: Simulated;Moderate assistance Where Assessed - Lower Body Dressing: Supported sit to Pharmacist, hospital: Performed;Minimal Web designer: Raised toilet seat with arms (or 3-in-1 over toilet) Toileting - Clothing Manipulation and Hygiene: Simulated;Minimal assistance Where Assessed - Engineer, mining and Hygiene: Standing Equipment Used:  Rolling walker;Long-handled shoe horn;Long-handled sponge;Reacher;Sock aid ADL Comments: Demonstrated all AE to pt and explained coverage. Pt at times unsafe with RW as she attempted to fold walker together in order to transfer closer to 3in1 due to tight space. Immediately OT explained that pt should not try to fold walker to get close to toilet but instead side step through narrow space with walker.  Pt did better with transfer out of bathroom with verbal cues and min assist to side step until she could safely turn walker to exit bathroom forward with RW.     OT Diagnosis: Generalized weakness;Acute pain  OT Problem List: Decreased strength;Pain OT Treatment Interventions: Self-care/ADL training;DME and/or AE instruction;Therapeutic activities;Patient/family education   OT Goals(Current goals can be found in the care plan section) Acute Rehab OT Goals Patient Stated Goal: rehab then return to I OT Goal Formulation: With patient Time For Goal Achievement: 08/01/13 Potential to Achieve Goals: Good  Visit Information  Last OT Received On: 07/25/13 Assistance Needed: +1 History of Present Illness: s/p DA L  THA       Prior Functioning     Home Living Family/patient expects to be discharged to:: Skilled nursing facility Living Arrangements: Children Additional Comments: grown son at home that is unable to help Prior Function Level of Independence: Independent Communication Communication: No difficulties         Vision/Perception     Cognition  Cognition Arousal/Alertness: Awake/alert Behavior During Therapy: WFL for tasks assessed/performed Overall Cognitive Status: Within Functional Limits for tasks assessed    Extremity/Trunk Assessment Upper Extremity Assessment Upper Extremity Assessment: Overall WFL for tasks assessed     Mobility Bed Mobility Bed Mobility: Supine to Sit Supine to Sit: 4: Min assist;HOB elevated Sit to Supine: 4: Min assist;HOB  elevated Details for Bed Mobility Assistance: cues for techniqueand assist with LLE Transfers  Transfers: Sit to Stand;Stand to Sit Sit to Stand: 4: Min assist;With upper extremity assist;From bed;From chair/3-in-1;4: Min guard Stand to Sit: 4: Min assist;With upper extremity assist;To bed;To chair/3-in-1 Details for Transfer Assistance: cues for proper hand placement and control of descent        Balance     End of Session OT - End of Session Equipment Utilized During Treatment: Rolling walker Activity Tolerance: Patient limited by pain Patient left: in bed;with call bell/phone within reach  GO     Lennox Laity [phone] 07/25/2013, 4:34 PM

## 2013-07-25 NOTE — Progress Notes (Signed)
07/25/13 1609  PT Visit Information  Last PT Received On 07/25/13  Assistance Needed +1  History of Present Illness s/p DA L  THA  PT Time Calculation  PT Start Time 1410  PT Stop Time 1428  PT Time Calculation (min) 18 min  Subjective Data  Patient Stated Goal rehab then return to I  Precautions  Precautions Fall  Restrictions  Other Position/Activity Restrictions WBAT  Cognition  Arousal/Alertness Awake/alert  Behavior During Therapy WFL for tasks assessed/performed  Overall Cognitive Status Within Functional Limits for tasks assessed  Bed Mobility  Bed Mobility Sit to Supine  Sit to Supine 4: Min assist  Details for Bed Mobility Assistance cues for techniqueand assist with LLE  Transfers  Transfers Sit to Stand;Stand to Sit  Sit to Stand 4: Min assist;4: Min guard  Stand to Sit 4: Min guard  Details for Transfer Assistance cues for proper hand placement and control of descent  Ambulation/Gait  Ambulation/Gait Assistance 4: Min guard;4: Min assist  Ambulation Distance (Feet) 80 Feet  Assistive device Rolling walker  Ambulation/Gait Assistance Details cues for RW safety and upward gaze  Gait Pattern Step-to pattern  Gait velocity slow  Total Joint Exercises  Ankle Circles/Pumps AROM;Both;10 reps  Heel Slides AAROM;AROM;Left;10 reps  Quad Sets AROM;Both;10 reps  PT - End of Session  Activity Tolerance Patient tolerated treatment well  Patient left in bed;with call bell/phone within reach;with family/visitor present  Nurse Communication Mobility status  PT - Assessment/Plan  PT Plan Current plan remains appropriate  PT Frequency 7X/week  Follow Up Recommendations SNF  PT equipment None recommended by PT  PT Goal Progression  Progress towards PT goals Progressing toward goals  Acute Rehab PT Goals  Time For Goal Achievement 07/25/13  Potential to Achieve Goals Good  PT General Charges  $$ ACUTE PT VISIT 1 Procedure  PT Treatments  $Gait Training 8-22 mins

## 2013-07-26 LAB — CBC
HCT: 31.2 % — ABNORMAL LOW (ref 36.0–46.0)
MCH: 29.7 pg (ref 26.0–34.0)
MCHC: 32.7 g/dL (ref 30.0–36.0)
MCV: 91 fL (ref 78.0–100.0)
RDW: 15.2 % (ref 11.5–15.5)

## 2013-07-26 NOTE — Progress Notes (Signed)
Clinical Social Work Department CLINICAL SOCIAL WORK PLACEMENT NOTE 07/26/2013  Patient:  JARISSA, SHERIFF  Account Number:  [id number] Admit date:  07/24/2013  Clinical Social Worker:  Doroteo Glassman  Date/time:  07/26/2013 01:05 PM  Clinical Social Work is seeking post-discharge placement for this patient at the following level of care:   SKILLED NURSING   (*CSW will update this form in Epic as items are completed)   07/26/2013  Patient/family provided with [facility] Department of Clinical Social Work's list of facilities offering this level of care within the geographic area requested by the patient (or if unable, by the patient's family).  07/26/2013  Patient/family informed of their freedom to choose among providers that offer the needed level of care, that participate in Medicare, Medicaid or managed care program needed by the patient, have an available bed and are willing to accept the patient.  07/26/2013  Patient/family informed of [facility]' ownership interest in [facility], as well as of the fact that they are under no obligation to receive care at this facility.  PASARR submitted to EDS on 07/26/2013 PASARR number received from EDS on 07/26/2013  FL2 transmitted to all facilities in geographic area requested by pt/family on  07/26/2013 FL2 transmitted to all facilities within larger geographic area on   Patient informed that his/her managed care company has contracts with or will negotiate with  certain facilities, including the following:     Patient/family informed of bed offers received:   Patient chooses bed at  Physician recommends and patient chooses bed at    Patient to be transferred to  on   Patient to be transferred to facility by   The following physician request were entered in Epic:   Additional Comments:  Providence Crosby, Theresia Majors Clinical Social Work [phone]

## 2013-07-26 NOTE — Progress Notes (Signed)
Subjective: 2 Days Post-Op Procedure(s) (LRB): LEFT TOTAL HIP ARTHROPLASTY ANTERIOR APPROACH (Left) Patient reports pain as moderate.  Slow mobility with therapy.  Will likely need SNF post hospitalization.  Objective: Vital signs in last 24 hours: Temp:  [98.4 F (36.9 C)-99 F (37.2 C)] 98.8 F (37.1 C) (11/09 0457) Pulse Rate:  [71-84] 84 (11/09 0457) Resp:  [16] 16 (11/09 0457) BP: (112-124)/(66-72) 112/69 mmHg (11/09 0457) SpO2:  [93 %-100 %] 98 % (11/09 0457)  Intake/Output from previous day: 11/08 0701 - 11/09 0700 In: 2125.7 [P.O.:1260; I.V.:865.7] Out: 1850 [Urine:1850] Intake/Output this shift:     Recent Labs  07/25/13 0543 07/26/13 0440  HGB 11.1* 10.2*    Recent Labs  07/25/13 0543 07/26/13 0440  WBC 12.9* 10.7*  RBC 3.77* 3.43*  HCT 34.4* 31.2*  PLT 292 249    Recent Labs  07/25/13 0543  NA 134*  K 3.9  CL 98  CO2 26  BUN 10  CREATININE 0.77  GLUCOSE 126*  CALCIUM 9.5   No results found for this basename: LABPT, INR,  in the last 72 hours  Sensation intact distally Intact pulses distally Dorsiflexion/Plantar flexion intact Incision: dressing C/D/I  Assessment/Plan: 2 Days Post-Op Procedure(s) (LRB): LEFT TOTAL HIP ARTHROPLASTY ANTERIOR APPROACH (Left) Up with therapy Social Work consult for SNF placement  Melodi Happel Y 07/26/2013, 7:37 AM

## 2013-07-26 NOTE — Progress Notes (Signed)
Physical Therapy Treatment Patient Details Name: Kiara Little MRN: [id number] DOB: [birth year] Today's Date: 07/26/2013 Time: 4098-1191 PT Time Calculation (min): 35 min  PT Assessment / Plan / Recommendation  History of Present Illness s/p DA L  THA   PT Comments   Pt progressing well; pleasant and motivated  Follow Up Recommendations  SNF     Does the patient have the potential to tolerate intense rehabilitation     Barriers to Discharge        Equipment Recommendations  None recommended by PT    Recommendations for Other Services    Frequency 7X/week   Progress towards PT Goals Progress towards PT goals: Progressing toward goals  Plan Current plan remains appropriate    Precautions / Restrictions Precautions Precautions: Fall Restrictions Other Position/Activity Restrictions: WBAT   Pertinent Vitals/Pain Pain left hip 7/10 however pt reports she is willing to do therapy at this time; had pain meds around 0800 this am    Mobility  Bed Mobility Bed Mobility: Sit to Supine Sit to Supine: 4: Min assist;HOB flat;4: Min guard Details for Bed Mobility Assistance: cues for techniqueand assist with LLE Transfers Transfers: Sit to Stand;Stand to Sit Sit to Stand: 4: Min guard;5: Supervision Stand to Sit: 4: Min guard;5: Supervision Details for Transfer Assistance: cues for proper hand placement and control of descent Ambulation/Gait Ambulation/Gait Assistance: 4: Min guard Ambulation Distance (Feet): 80 Feet (15' more) Assistive device: Rolling walker Ambulation/Gait Assistance Details: cues for sequence,posture Gait Pattern: Step-to pattern    Exercises Total Joint Exercises Ankle Circles/Pumps: AROM;Both;10 reps Quad Sets: AROM;Both;10 reps Heel Slides: AROM;AAROM;Right;15 reps Hip ABduction/ADduction: AAROM;Right;10 reps   PT Diagnosis:    PT Problem List:   PT Treatment Interventions:     PT Goals (current goals can now be found in the care plan  section) Acute Rehab PT Goals Patient Stated Goal: rehab then return to I Time For Goal Achievement: 07/29/13 Potential to Achieve Goals: Good  Visit Information  Last PT Received On: 07/26/13 Assistance Needed: +1 History of Present Illness: s/p DA L  THA    Subjective Data  Subjective: ok Patient Stated Goal: rehab then return to I   Cognition  Cognition Arousal/Alertness: Awake/alert Behavior During Therapy: WFL for tasks assessed/performed Overall Cognitive Status: Within Functional Limits for tasks assessed    Balance     End of Session PT - End of Session Activity Tolerance: Patient tolerated treatment well Patient left: in bed;with call bell/phone within reach Nurse Communication: Mobility status   GP     [facility] 07/26/2013, 1:11 PM

## 2013-07-26 NOTE — Progress Notes (Signed)
NCM spoke to pt and she wants to go to SNF-rehab. Notified CSW for SNF placement. Isidoro Donning RN CCM Case Mgmt phone [phone]

## 2013-07-26 NOTE — Progress Notes (Signed)
Clinical Social Work Department BRIEF PSYCHOSOCIAL ASSESSMENT 07/26/2013  Patient:  Kiara Little, Kiara Little     Account Number:  [id number]     Admit date:  07/24/2013  Clinical Social Worker:  Doroteo Glassman  Date/Time:  07/26/2013 01:01 PM  Referred by:  Physician  Date Referred:  07/26/2013 Referred for  SNF Placement   Other Referral:   Interview type:  Patient Other interview type:    PSYCHOSOCIAL DATA Living Status:  FAMILY Admitted from facility:   Level of care:   Primary support name:  Criss Rosales Primary support relationship to patient:  SIBLING Degree of support available:   adequate    CURRENT CONCERNS Current Concerns  Post-Acute Placement   Other Concerns:    SOCIAL WORK ASSESSMENT / PLAN Met with Pt to discuss d/c plan.    Pt stated that she is agreeable to SNF and that she's interested in [place] or [place].  She gave CSW permission to send her information to all SNFs in [place] and [place]    CSW provided Pt with a SNF list.    CSW thanked Pt for her time.   Assessment/plan status:  Psychosocial Support/Ongoing Assessment of Needs Other assessment/ plan:   Information/referral to community resources:   SNF list    PATIENT'S/FAMILY'S RESPONSE TO PLAN OF CARE: Pt's responsive to her plan of care was positive.  Pt understands the need for SNF and is agreeable.    Pt had a bright affect and laughed easily thoughout the Ax.    Pt thanked CSW for time and assistance.   Providence Crosby, LCSWA Clinical Social Work [phone]

## 2013-07-26 NOTE — Progress Notes (Signed)
07/26/13 1600  PT Visit Information  Last PT Received On 07/26/13  Assistance Needed +1  History of Present Illness s/p DA L  THA  PT Time Calculation  PT Start Time 1556  PT Stop Time 1616  PT Time Calculation (min) 20 min  Subjective Data  Subjective ok  Patient Stated Goal rehab then return to I  Precautions  Precautions Fall  Restrictions  Other Position/Activity Restrictions WBAT  Cognition  Arousal/Alertness Awake/alert  Behavior During Therapy WFL for tasks assessed/performed  Overall Cognitive Status Within Functional Limits for tasks assessed  Bed Mobility  Bed Mobility Supine to Sit;Sit to Supine  Supine to Sit 4: Min assist;HOB elevated  Sit to Supine 4: Min assist;HOB flat;4: Min guard  Details for Bed Mobility Assistance cues for technique and assist with LLE   Pt bed change due to ice pack having leaked all over bed; pt states she had already gotten back into the wet bed once because no one ever came to help her out of the bathroom so she got up by herself  Transfers  Transfers Sit to Stand;Stand to Sit  Sit to Stand 4: Min guard;5: Supervision  Stand to Sit 4: Min guard;5: Supervision  Details for Transfer Assistance cues for proper hand placement and control of descent  Ambulation/Gait  Ambulation/Gait Assistance 4: Min guard  Ambulation Distance (Feet) 15 Feet (times 2)  Assistive device Rolling walker  Ambulation/Gait Assistance Details cues for posture and RW safety in bathroom  Gait Pattern Step-to pattern  Total Joint Exercises  Ankle Circles/Pumps AROM;Both;10 reps  Heel Slides AAROM;Left;10 reps  Hip ABduction/ADduction AAROM;Right;10 reps  Quad Sets AROM;Both;10 reps  PT - End of Session  Activity Tolerance Patient tolerated treatment well  Patient left in bed;with call bell/phone within reach  Nurse Communication Mobility status  PT - Assessment/Plan  PT Plan Current plan remains appropriate  PT Frequency 7X/week  Follow Up Recommendations  SNF  PT equipment None recommended by PT  PT Goal Progression  Progress towards PT goals Progressing toward goals  Acute Rehab PT Goals  Time For Goal Achievement 07/29/13  Potential to Achieve Goals Good  PT General Charges  $$ ACUTE PT VISIT 1 Procedure  PT Treatments  $Gait Training 8-22 mins

## 2013-07-27 ENCOUNTER — Other Ambulatory Visit: Payer: Self-pay | Admitting: *Deleted

## 2013-07-27 ENCOUNTER — Inpatient Hospital Stay
Admission: RE | Admit: 2013-07-27 | Discharge: 2013-08-11 | Disposition: A | Discharge status: Home / Self Care | Payer: PRIVATE HEALTH INSURANCE | Source: Ambulatory Visit | Attending: Internal Medicine | Admitting: Internal Medicine

## 2013-07-27 DIAGNOSIS — R7611 Nonspecific reaction to tuberculin skin test without active tuberculosis: Principal | ICD-10-CM

## 2013-07-27 LAB — CBC
MCH: 29.7 pg (ref 26.0–34.0)
MCHC: 32.2 g/dL (ref 30.0–36.0)
Platelets: 221 10*3/uL (ref 150–400)
RDW: 15.4 % (ref 11.5–15.5)

## 2013-07-27 MED ORDER — OXYCODONE-ACETAMINOPHEN 5-325 MG PO TABS: 1.0000 | ORAL_TABLET | ORAL | Status: DC | PRN

## 2013-07-27 MED ORDER — OXYCODONE-ACETAMINOPHEN 5-325 MG PO TABS: ORAL_TABLET | ORAL | Status: DC

## 2013-07-27 MED ORDER — ASPIRIN 325 MG PO TBEC: 325.0000 mg | DELAYED_RELEASE_TABLET | Freq: Two times a day (BID) | ORAL | Status: DC

## 2013-07-27 NOTE — Progress Notes (Signed)
Subjective: 3 Days Post-Op Procedure(s) (LRB): LEFT TOTAL HIP ARTHROPLASTY ANTERIOR APPROACH (Left) Patient reports pain as mild.  No chest pain , SOB or N/V.  Objective: Vital signs in last 24 hours: Temp:  [98.6 F (37 C)-99.8 F (37.7 C)] 98.6 F (37 C) (11/10 0513) Pulse Rate:  [73-77] 73 (11/10 0513) Resp:  [16-17] 16 (11/10 0513) BP: (113-144)/(66-81) 144/81 mmHg (11/10 0513) SpO2:  [94 %-98 %] 98 % (11/10 0513)  Intake/Output from previous day: 11/09 0701 - 11/10 0700 In: 1723.3 [P.O.:1440; I.V.:283.3] Out: 800 [Urine:800] Intake/Output this shift:     Recent Labs  07/25/13 0543 07/26/13 0440 07/27/13 0505  HGB 11.1* 10.2* 9.8*    Recent Labs  07/26/13 0440 07/27/13 0505  WBC 10.7* 11.0*  RBC 3.43* 3.30*  HCT 31.2* 30.4*  PLT 249 221    Recent Labs  07/25/13 0543  NA 134*  K 3.9  CL 98  CO2 26  BUN 10  CREATININE 0.77  GLUCOSE 126*  CALCIUM 9.5   No results found for this basename: LABPT, INR,  in the last 72 hours  Neurovascular intact Sensation intact distally Incision: dressing C/D/I Compartment soft  Assessment/Plan: 3 Days Post-Op Procedure(s) (LRB): LEFT TOTAL HIP ARTHROPLASTY ANTERIOR APPROACH (Left) Discharge to SNF Follow up with Dr. Magnus Ivan at 2 weeks post-op.   Richardean Canal 07/27/2013, 7:42 AM

## 2013-07-27 NOTE — Progress Notes (Signed)
Physical Therapy Treatment Patient Details Name: Kiara Little MRN: [id number] DOB: 31-Jan-1936 Today's Date: 07/27/2013 Time: 0102-7253 PT Time Calculation (min): 32 min  PT Assessment / Plan / Recommendation  History of Present Illness s/p DA L  THA   PT Comments     Follow Up Recommendations  SNF     Does the patient have the potential to tolerate intense rehabilitation     Barriers to Discharge        Equipment Recommendations  None recommended by PT    Recommendations for Other Services    Frequency 7X/week   Progress towards PT Goals Progress towards PT goals: Progressing toward goals  Plan Current plan remains appropriate    Precautions / Restrictions Precautions Precautions: Fall Restrictions Weight Bearing Restrictions: No Other Position/Activity Restrictions: WBAT   Pertinent Vitals/Pain Min c/o pain; premed, ice pack provided    Mobility  Bed Mobility Bed Mobility: Supine to Sit;Sit to Supine Supine to Sit: 4: Min assist;HOB elevated Details for Bed Mobility Assistance: cues for technique and assist with LLE Transfers Transfers: Sit to Stand;Stand to Sit Sit to Stand: 4: Min guard;5: Supervision Stand to Sit: 4: Min guard;5: Supervision Details for Transfer Assistance: cues for proper hand placement and control of descent Ambulation/Gait Ambulation/Gait Assistance: 4: Min guard Ambulation Distance (Feet): 200 Feet Assistive device: Rolling walker Ambulation/Gait Assistance Details: cues for initial sequence, posture and position from RW Gait Pattern: Step-to pattern;Step-through pattern;Decreased step length - right;Decreased step length - left;Shuffle;Trunk flexed    Exercises Total Joint Exercises Ankle Circles/Pumps: AROM;Both;20 reps Quad Sets: AROM;Both;Supine;10 reps Heel Slides: AAROM;Left;20 reps Hip ABduction/ADduction: AAROM;Right;20 reps;Supine   PT Diagnosis:    PT Problem List:   PT Treatment Interventions:     PT Goals (current  goals can now be found in the care plan section) Acute Rehab PT Goals Patient Stated Goal: rehab then return to I Time For Goal Achievement: 07/29/13 Potential to Achieve Goals: Good  Visit Information  Last PT Received On: 07/27/13 Assistance Needed: +1 History of Present Illness: s/p DA L  THA    Subjective Data  Patient Stated Goal: rehab then return to I   Cognition  Cognition Arousal/Alertness: Awake/alert Behavior During Therapy: WFL for tasks assessed/performed Overall Cognitive Status: Within Functional Limits for tasks assessed    Balance     End of Session PT - End of Session Equipment Utilized During Treatment: Gait belt Activity Tolerance: Patient tolerated treatment well Patient left: with call bell/phone within reach;in chair Nurse Communication: Mobility status   GP     Anmol Fleck 07/27/2013, 12:27 PM

## 2013-07-27 NOTE — Discharge Summary (Signed)
Patient ID: Kiara Little MRN: [id number] DOB/AGE: [birth year] 77 y.o.  Admit date: 07/24/2013 Discharge date: 07/27/2013  Admission Diagnoses:  Principal Problem:   Degenerative arthritis of left hip   Discharge Diagnoses:  Same  Past Medical History  Diagnosis Date  . Muscle spasm   . Hypertension   . Tobacco abuse   . Noncompliance   . Chest pain   . Pain     BOTH FEET "TIGHTNESS" "NO NERVE PAIN" - TAKES GABAPENTIN  . Arthritis     OA LEFT HIP   . Osteoarthritis     a. s/p R THA    Surgeries: Procedure(s): LEFT TOTAL HIP ARTHROPLASTY ANTERIOR APPROACH on 07/24/2013   Consultants:    Discharged Condition: Improved  Hospital Course: Kiara Little is an 77 y.o. female who was admitted 07/24/2013 for operative treatment ofDegenerative arthritis of hip. Patient has severe unremitting pain that affects sleep, daily activities, and work/hobbies. After pre-op clearance the patient was taken to the operating room on 07/24/2013 and underwent  Procedure(s): LEFT TOTAL HIP ARTHROPLASTY ANTERIOR APPROACH.    Patient was given perioperative antibiotics: Anti-infectives   Start     Dose/Rate Route Frequency Ordered Stop   07/24/13 2200  ceFAZolin (ANCEF) IVPB 1 g/50 mL premix     1 g 100 mL/hr over 30 Minutes Intravenous Every 6 hours 07/24/13 1846 07/25/13 0408   07/24/13 1126  ceFAZolin (ANCEF) IVPB 2 g/50 mL premix     2 g 100 mL/hr over 30 Minutes Intravenous On call to O.R. 07/24/13 1126 07/24/13 1520       Patient was given sequential compression devices, early ambulation, and chemoprophylaxis to prevent DVT.  Patient benefited maximally from hospital stay and there were no complications.    Recent vital signs: Patient Vitals for the past 24 hrs:  BP Temp Temp src Pulse Resp SpO2  07/27/13 0513 144/81 mmHg 98.6 F (37 C) Oral 73 16 98 %  07/26/13 2148 113/66 mmHg 99 F (37.2 C) Oral 76 16 95 %  07/26/13 1359 117/69 mmHg 99.8 F (37.7 C) Oral 77 17 94 %      Recent laboratory studies:  Recent Labs  07/25/13 0543 07/26/13 0440 07/27/13 0505  WBC 12.9* 10.7* 11.0*  HGB 11.1* 10.2* 9.8*  HCT 34.4* 31.2* 30.4*  PLT 292 249 221  NA 134*  --   --   K 3.9  --   --   CL 98  --   --   CO2 26  --   --   BUN 10  --   --   CREATININE 0.77  --   --   GLUCOSE 126*  --   --   CALCIUM 9.5  --   --      Discharge Medications:     Medication List         amLODipine 5 MG tablet  Commonly known as:  NORVASC  Take 5 mg by mouth every morning.     aspirin 325 MG EC tablet  Take 1 tablet (325 mg total) by mouth 2 (two) times daily after a meal.     gabapentin 300 MG capsule  Commonly known as:  NEURONTIN  Take 300 mg by mouth 3 (three) times daily.     multivitamin with minerals Tabs tablet  Take 1 tablet by mouth daily.     OVER THE COUNTER MEDICATION  1 tablet daily.     oxyCODONE-acetaminophen 5-325 MG per tablet  Commonly known as:  ROXICET  Take 1-2 tablets by mouth every 4 (four) hours as needed for severe pain.        Diagnostic Studies: Dg Hip Complete Left  07/24/2013   CLINICAL DATA:  Left anterior hip replacement  EXAM: DG C-ARM 1-60 MIN - NRPT [facility]; LEFT HIP - COMPLETE 2+ VIEW  COMPARISON:  None.  FINDINGS: Patient is status post left hip arthroplasty. Components appear aligned in the frontal plane. Prior right hip arthroplasty also present. No definite hardware or osseous abnormality.  IMPRESSION: Expected appearance status post left hip arthroplasty.   Electronically Signed   By: Ruel Favors M.D.   On: 07/24/2013 17:04   Dg Pelvis Portable  07/24/2013   CLINICAL DATA:  Status post left hip replacement.  EXAM: PORTABLE PELVIS 1-2 VIEWS; PORTABLE LEFT HIP - 1 VIEW  COMPARISON:  None.  FINDINGS: One view pelvis in cross-table lateral view of the left hip demonstrate that the patient is now status post left total hip arthroplasty. The hip is located. Gas is present within the soft tissues as expected. Skin staples are in  place.  IMPRESSION: Status post left total hip arthroplasty without radiographic evidence for complication.   Electronically Signed   By: Gennette Pac M.D.   On: 07/24/2013 20:22   Dg Hip Portable 1 View Left  07/24/2013   CLINICAL DATA:  Status post left hip replacement.  EXAM: PORTABLE PELVIS 1-2 VIEWS; PORTABLE LEFT HIP - 1 VIEW  COMPARISON:  None.  FINDINGS: One view pelvis in cross-table lateral view of the left hip demonstrate that the patient is now status post left total hip arthroplasty. The hip is located. Gas is present within the soft tissues as expected. Skin staples are in place.  IMPRESSION: Status post left total hip arthroplasty without radiographic evidence for complication.   Electronically Signed   By: Gennette Pac M.D.   On: 07/24/2013 20:22   Dg C-arm 1-60 Min-no Report  07/24/2013   CLINICAL DATA:  Left anterior hip replacement  EXAM: DG C-ARM 1-60 MIN - [facility]; LEFT HIP - COMPLETE 2+ VIEW  COMPARISON:  None.  FINDINGS: Patient is status post left hip arthroplasty. Components appear aligned in the frontal plane. Prior right hip arthroplasty also present. No definite hardware or osseous abnormality.  IMPRESSION: Expected appearance status post left hip arthroplasty.   Electronically Signed   By: Ruel Favors M.D.   On: 07/24/2013 17:04    Disposition: to skilled nursing facility      Discharge Orders   Future Orders Complete By Expires   Discharge wound care:  As directed    Comments:     Keep dressing clean and intact. May shower with dressing intact . Remove dressing Thursday and shower. After showering apply clean dressing.   Weight bearing as tolerated  As directed    Questions:     Laterality:     Extremity:        Follow-up Information   Follow up with Kathryne Hitch, MD. Schedule an appointment as soon as possible for a visit in 2 weeks.   Specialty:  Orthopedic Surgery   Contact information:   [address] [place]  [address] [phone]        Signed: Kathryne Hitch 07/27/2013, 9:36 AM

## 2013-07-28 LAB — TYPE AND SCREEN
ABO/RH(D): B POS
DAT, IgG: NEGATIVE
Unit division: 0
Unit division: 0

## 2013-07-29 ENCOUNTER — Non-Acute Institutional Stay (SKILLED_NURSING_FACILITY): Payer: PRIVATE HEALTH INSURANCE | Admitting: Internal Medicine

## 2013-07-29 ENCOUNTER — Encounter (HOSPITAL_COMMUNITY): Payer: Self-pay | Admitting: Orthopaedic Surgery

## 2013-07-29 DIAGNOSIS — T8484XS Pain due to internal orthopedic prosthetic devices, implants and grafts, sequela: Secondary | ICD-10-CM

## 2013-07-29 DIAGNOSIS — G609 Hereditary and idiopathic neuropathy, unspecified: Secondary | ICD-10-CM

## 2013-07-29 DIAGNOSIS — I10 Essential (primary) hypertension: Secondary | ICD-10-CM

## 2013-07-29 DIAGNOSIS — T889XXS Complication of surgical and medical care, unspecified, sequela: Secondary | ICD-10-CM

## 2013-07-30 ENCOUNTER — Ambulatory Visit (HOSPITAL_COMMUNITY)
Admit: 2013-07-30 | Discharge: 2013-07-30 | Disposition: A | Discharge status: Home / Self Care | Payer: PRIVATE HEALTH INSURANCE | Attending: Internal Medicine | Admitting: Internal Medicine

## 2013-07-30 DIAGNOSIS — R7611 Nonspecific reaction to tuberculin skin test without active tuberculosis: Secondary | ICD-10-CM | POA: Insufficient documentation

## 2013-08-04 NOTE — Progress Notes (Signed)
Patient ID: Kiara Little, female   DOB: 04/02/1936, 77 y.o.   MRN: [id number]           HISTORY & PHYSICAL  DATE:  07/29/2013  FACILITY: [facility]    LEVEL OF CARE:   SNF   CHIEF COMPLAINT:  Admission to SNF, post stay at [facility], 07/24/2013 through 07/27/2013.    HISTORY OF PRESENT ILLNESS:  This is a 77 year-old woman who was an independent patient living on her own, who was admitted electively for left hip arthroplasty.  She had this secondary to severe degenerative arthritis of the hip.  She appears to have tolerated this uneventfully.  She was placed on a regimen of aspirin 325 b.i.d. for DVT prophylaxis and, although I see this commonly now, I am unaware of the benefit of aspirin in the prevention of thromboembolic events in postsurgical patients.  My understanding of this literature is that aspirin has in some studies shown a benefit.  However, in others, there has been no benefit or inferiority compared with other modalities.  I will, therefore, change her to Xarelto.    PAST MEDICAL HISTORY/PROBLEM LIST:  Hypertension.    Tobacco abuse.    Non-cardiac chest pain.    Lower extremity neuropathic pain for which she takes gabapentin.    CURRENT MEDICATIONS:  Medication list is reviewed.    SOCIAL HISTORY: HOUSING:  The patient lives in [place].   FUNCTIONAL STATUS:  She is independent with ADLs and IADLs.  Does not use an ambulatory assist device.    REVIEW OF SYSTEMS:   CHEST/RESPIRATORY:  No shortness of breath.   CARDIAC:   No chest pain.   GI:  No nausea, vomiting, diarrhea.   GU:  No dysuria.   MUSCULOSKELETAL:  She is already up ambulating.    PHYSICAL EXAMINATION:   GENERAL APPEARANCE:  Very well-looking woman in no distress.   CHEST/RESPIRATORY:  Clear air entry bilaterally.   CARDIOVASCULAR:  CARDIAC:   Heart sounds are normal.  There are no murmurs.   GASTROINTESTINAL:  LIVER/SPLEEN/KIDNEYS:  No liver, no spleen.  No tenderness.    GENITOURINARY:  BLADDER:   No suprapubic or costovertebral angle tenderness.   MUSCULOSKELETAL:   EXTREMITIES:   LEFT LOWER EXTREMITY:  Her left hip incision is very clean.  Well approximated without evidence of infection.   CIRCULATION:   ARTERIAL:  Extremities:  Peripheral pulses are palpable.   EDEMA/VARICOSITIES:  She does have some mild warmth and edema in the left leg.   However, at this point I think this falls short of any concern about an active DVT.    ASSESSMENT/PLAN:  Left total hip replacement.  This was done electively.  I have changed her to Xarelto from aspirin for DVT prophylaxis.    Hypertension.  She continues on amlodipine.    Question peripheral neuropathy.  On Neurontin 300  t.i.d.  She is not a listed diabetic.    CPT CODE: 08657

## 2013-08-07 ENCOUNTER — Non-Acute Institutional Stay (SKILLED_NURSING_FACILITY): Payer: PRIVATE HEALTH INSURANCE | Admitting: Internal Medicine

## 2013-08-07 DIAGNOSIS — Z96649 Presence of unspecified artificial hip joint: Secondary | ICD-10-CM

## 2013-08-07 DIAGNOSIS — D649 Anemia, unspecified: Secondary | ICD-10-CM

## 2013-08-07 DIAGNOSIS — I10 Essential (primary) hypertension: Secondary | ICD-10-CM

## 2013-08-07 DIAGNOSIS — G609 Hereditary and idiopathic neuropathy, unspecified: Secondary | ICD-10-CM

## 2013-08-07 NOTE — Progress Notes (Signed)
Patient ID: Kiara Little, female   DOB: Apr 11, 1936, 77 y.o.   MRN: [id number] FACILITY:        [facility]     LEVEL OF CARE:   SNF Facility [facility].  This is a discharge note     CHIEF COMPLAINT: Discharge note.     HISTORY OF PRESENT ILLNESS:  This is a 77 year-old woman who was an independent patient living on her own, who was admitted electively for left hip arthroplasty.  She had this secondary to severe degenerative arthritis of the hip.  She appears to have tolerated this uneventfully.  She was placed on a regimen of aspirin 325 b.i.d. for DVT prophylaxis--Dr. Leanord Hawking switched her to Xeralto-alert now that she is largely ambulatory she is only on aspirin.  Her stay here as been quite unremarkable I have not noted really any issues and nursing concurs with this-she is on Roxicet for pain this appears to be effective she will need orthopedic followup and outpatient therapy.  She apparently will be living with family.  She does have a history of neuropathy is on Neurontin 3 times a day and apparently this appears to be helping.  She also has a history of hypertension is on low dose Norvasc recent blood pressures were 13/74 108/69 105/65 I see one 99/69 but this did not appear to be the norm.  She has no acute complaints this evening is looking forward to going home-she is ambulating with a walker.       Marland Kitchen     PAST MEDICAL HISTORY/PROBLEM LIST: Hypertension.    Tobacco abuse.    Non-cardiac chest pain.    Lower extremity neuropathic pain for which she takes gabapentin.     CURRENT MEDICATIONS:  Medication list is reviewed.     SOCIAL HISTORY: HOUSING:  The patient lives in [place].    FUNCTIONAL STATUS:  She is independent with ADLs and IADLs.  did not use an ambulatory assist device.     REVIEW OF SYSTEMS:  General denies any fever chills says she feels well  CHEST/RESPIRATORY:  No shortness of breath.    CARDIAC:   No chest pain.    GI:  No nausea,  vomiting, diarrhea.    GU:  No dysuria.    MUSCULOSKELETAL:  She is  up ambulating--ears be doing well with that.  Neurologic history of neuropathy is noted at this appears to be under decent control.  Psych-no noted issues here.     PHYSICAL EXAMINATION:  Temperatures 97.6 pulse 67 respirations 20 blood pressure 113/74  GENERAL APPEARANCE:  Very well-looking woman in no distress.    CHEST/RESPIRATORY:  Clear air entry bilaterally.    CARDIOVASCULAR:   CARDIAC:   Heart sounds are normal.  There are no murmurs.    GASTROINTESTINAL:--Abdomen is soft nontender with positive bowel sounds    .    MUSCULOSKELETAL:    EXTREMITIES:    LEFT LOWER EXTREMITY:  Her left hip incision has Steri-Strips applied there is no sign of infection no drainage bleeding or significant erythema--- she is ambulating well with a walker.    Was all other extremities appears at baseline-.  Neurologic I do not see any lateralizing findings were focal deficits.  Psych she is alert and oriented pleasant and appropriate.  Labs.  07/27/2013.  Hemoglobin 9.8 platelets 221.  11/08/ 2014.  Sodium 134 potassium 3.9 BUN 10 creatinine 0.77.  Assessment and plan      : Left total hip replacement.  This was done electively. She appears to be doing very well in this regards will need continued outpatient therapy-she continues on aspirin for anticoagulation.--She is receiving Roxicet for pain when necessary this appears to help    Hypertension.  She continues on amlodipine--stable.    Question peripheral neuropathy.  On Neurontin 300  t.i.d.  She is not a listed diabetic Anemia-suspect there is an element of postop loss -- we'll update this before discharge Mild hyponatremia-will update metabolic panel before discharge as well.  [id number]  .

## 2013-12-15 ENCOUNTER — Other Ambulatory Visit: Payer: Self-pay | Admitting: Internal Medicine

## 2013-12-15 DIAGNOSIS — E2839 Other primary ovarian failure: Secondary | ICD-10-CM

## 2014-04-30 ENCOUNTER — Ambulatory Visit (HOSPITAL_COMMUNITY)
Admission: RE | Admit: 2014-04-30 | Discharge: 2014-04-30 | Disposition: A | Discharge status: Home / Self Care | Payer: Medicare HMO | Source: Ambulatory Visit | Attending: Orthopaedic Surgery | Admitting: Orthopaedic Surgery

## 2014-04-30 DIAGNOSIS — M1610 Unilateral primary osteoarthritis, unspecified hip: Secondary | ICD-10-CM | POA: Insufficient documentation

## 2014-04-30 DIAGNOSIS — M25659 Stiffness of unspecified hip, not elsewhere classified: Secondary | ICD-10-CM | POA: Insufficient documentation

## 2014-04-30 DIAGNOSIS — M169 Osteoarthritis of hip, unspecified: Secondary | ICD-10-CM | POA: Diagnosis present

## 2014-04-30 DIAGNOSIS — M6281 Muscle weakness (generalized): Secondary | ICD-10-CM | POA: Diagnosis not present

## 2014-04-30 DIAGNOSIS — IMO0001 Reserved for inherently not codable concepts without codable children: Secondary | ICD-10-CM | POA: Insufficient documentation

## 2014-04-30 NOTE — Evaluation (Signed)
Physical Therapy Evaluation  Patient Details  Name: Kiara Little MRN: [id number] Date of Birth: 07/12/1936  Today's Date: 04/30/2014 Time: [id number] PT Time Calculation (min): 65 min     Charges: 1 Evaluation, TherEx 1420-1445         Visit#: 1 of 17  Re-eval: 05/30/14 Assessment Diagnosis: Bilateral hip stiffness Surgical Date: 07/24/13 Next MD Visit: not til next year Prior Therapy: Yes, follwoign surgery.   Authorization: Medicaid/Medicare/[employer]     Past Medical History:  Past Medical History  Diagnosis Date  . Muscle spasm   . Hypertension   . Tobacco abuse   . Noncompliance   . Chest pain   . Pain     BOTH FEET "TIGHTNESS" "NO NERVE PAIN" - TAKES GABAPENTIN  . Arthritis     OA LEFT HIP   . Osteoarthritis     a. s/p R THA   Past Surgical History:  Past Surgical History  Procedure Laterality Date  . Back surgery    . Joint replacement  2000    RIGHT TOTAL HIP ARTHROPLASTY  . Surgery for tubal pregnancy    . Total hip arthroplasty Left 07/24/2013    Procedure: LEFT TOTAL HIP ARTHROPLASTY ANTERIOR APPROACH;  Surgeon: Kathryne Hitchhristopher Y Blackman, MD;  Location: [facility];  Service: Orthopedics;  Laterality: Left;    Subjective Symptoms/Limitations Symptoms: No pain, primary complainof stiffness.  Pertinent History: Bilateral THA, Back surgery not specified, hypertension. Lt hip especially limited, surgery performed 07/24/2013 Limitations: Walking How long can you stand comfortably?: 5minutes due to stiffness (soreness) and then has to sit down.  How long can you walk comfortably?: 505ft due to stiffness Patient Stated Goals: to walk  >4630minutes, be able to perform house work independently.  Pain Assessment Currently in Pain?: No/denies  Cognition/Observation Observation/Other Assessments Observations: Gait: excessive toe out, pes planus, limited tibial internal rotatien (most limited on Lt), very limited hip Frontal plane and transverse plane sway, limited hip  extesnion, early heel rise.  Other Assessments: 3D hip excursions: limited hip extension, limited squat depth, Limited frontal pl;ane and transverse plane mobility bilaterally   Sensation/Coordination/Flexibility/Functional Tests Flexibility Thomas: Positive Obers: Positive 90/90: Positive (Rt 70 degrees, Lt 45 degrees) Functional Tests Functional Tests: Ely: positive  Assessment RLE AROM (degrees) Right Ankle Dorsiflexion: 10 RLE Strength Right Hip Flexion: 5/5 Right Hip External Rotation : 36 Right Hip Internal Rotation : 36 Right Knee Flexion: 4/5 Right Knee Extension: 5/5 Right Ankle Dorsiflexion: 5/5 LLE AROM (degrees) Left Hip External Rotation : 35 Left Hip Internal Rotation : 9 Left Ankle Dorsiflexion: 12 LLE Strength Left Hip Flexion: 4/5 Left Hip ABduction: 3+/5 Left Knee Flexion: 4/5 Left Knee Extension: 5/5 Left Ankle Dorsiflexion: 5/5  Exercise/Treatments 3D hip excursion 10x each  gastroc 4x15 seconds  hip flexor 4x15 seconds  hamstring 4x15 seconds  groin stretch, 4x15 seconds Physical Therapy Assessment and Plan PT Assessment and Plan Clinical Impression Statement: Patient arrives with a history of bilateral THA with primary complain of dififuclty walkign secodnary to bilateral hip stiffness with Lt hip stiffer than Rt as well as Lt hip weakness > than Rt hip weaknes resulting in an abnormal gait. Specfically patient displays limited gastroc, limited hip flexor, limited quadraceps, limited hamstring, limited piriformis, limited  glut flexibility resulting in decreased stride  length and decreased ability to effectively decelerate landnigs durign gait resultign in excessive strain throughout bilateral LEs into hips. patient will benefit from  skileld physical therapy to improve bilateral  LE mobility to normalize gait so  patient can return to performaning all household chores and walking greater than 30 minutes to be able to do grocery shopping.  Pt will  benefit from skilled therapeutic intervention in order to improve on the following deficits: Abnormal gait;Decreased activity tolerance;Difficulty walking;Decreased strength;Decreased range of motion;Increased fascial restricitons;Increased muscle spasms;Impaired flexibility;Pain;Improper body mechanics Rehab Potential: Good PT Frequency: Min 2X/week PT Duration: 8 weeks PT Treatment/Interventions: Gait training;Stair training;Functional mobility training;Therapeutic activities;Therapeutic exercise;Balance training;Neuromuscular re-education;Modalities;Manual techniques;Patient/family education PT Plan: Initial focus on increasign bilateral LE flexibility for: gastroc, hamstrigns, quadraceps, hip flexors, gluts, piriformis, and ITBand    Goals Home Exercise Program Pt/caregiver will Perform Home Exercise Program: For increased ROM PT Goal: Perform Home Exercise Program - Progress: Goal set today PT Short Term Goals Time to Complete Short Term Goals: 4 weeks PT Short Term Goal 1: Patient will be able to dorsiflex feet to >15 degrees to decrease early heel off durign gait PT Short Term Goal 2: Patient willl be able to increase hip extension to >10 degrees to increase stride length PT Short Term Goal 3: Patient will demsontrate increased hip internal rotation to >30 degrees bilaterally to improve deceleration during gait PT Short Term Goal 4: patient will demosntrate a negative 90/90 test, negative piriformis test, and negative ely test so patient can sit with ankel crossed over knee bilaterally PT Short Term Goal 5: Patient will demosntrate a negaitive Ober's test indicating increased IT/band mobility to indicate increased Lt hip scar tissue mobility.  PT Long Term Goals Time to Complete Long Term Goals: 8 weeks PT Long Term Goal 1: Patient will demsontrate hip Extension/aduction MMt 4/5 to indicate improved ability to perform sit to stand without UE assistance PT Long Term Goal 2: Patient will be  able to ambualte >32minutnes without stopping for rest Long Term Goal 3: Patient will be able to lift 10lb from floor 10x with out pain and with good mechnics to demosntrate ability to perform normal husehold ADL's  Problem List Patient Active Problem List   Diagnosis Date Noted  . Stiffness of joint, not elsewhere classified, pelvic region and thigh 04/30/2014  . Muscle weakness (generalized) 04/30/2014  . Degenerative arthritis of left hip 07/17/2013  . HTN (hypertension) 02/05/2013  . Chest pain, atypical 02/05/2013  . Anxiety 02/05/2013  . Nicotine abuse 02/05/2013    PT - End of Session Activity Tolerance: Patient tolerated treatment well General Behavior During Therapy: WFL for tasks assessed/performed PT Plan of Care PT Home Exercise Plan: 3D hip excursion, gastroc, hip flexor, hamstring, and groin stretch. twice daily, 4x15 seconds Consulted and Agree with Plan of Care: Patient  GP Functional Assessment Tool Used: FOTO 63% limited  Functional Limitation: Mobility: Walking and moving around Mobility: Walking and Moving Around Current Status ([id number]): At least 60 percent but less than 80 percent impaired, limited or restricted Mobility: Walking and Moving Around Goal Status [phone]): At least 40 percent but less than 60 percent impaired, limited or restricted  Doyne Keel 04/30/2014, 3:01 PM  Physician Documentation Your signature is required to indicate approval of the treatment plan as stated above.  Please sign and either send electronically or make a copy of this report for your files and return this physician signed original.   Please mark one 1.__approve of plan  2. ___approve of plan with the following conditions.   ______________________________  _____________________ Physician Signature                                                                                                             Date

## 2014-05-12 ENCOUNTER — Ambulatory Visit (HOSPITAL_COMMUNITY)
Admission: RE | Admit: 2014-05-12 | Discharge: 2014-05-12 | Disposition: A | Discharge status: Home / Self Care | Payer: Medicare HMO | Source: Ambulatory Visit | Attending: Orthopaedic Surgery | Admitting: Orthopaedic Surgery

## 2014-05-12 DIAGNOSIS — IMO0001 Reserved for inherently not codable concepts without codable children: Secondary | ICD-10-CM | POA: Diagnosis not present

## 2014-05-12 NOTE — Progress Notes (Signed)
Physical Therapy Treatment Patient Details  Name: Kiara Little MRN: [id number] Date of Birth: 24-Jul-1936  Today's Date: 05/12/2014 Time: 4098-1191 PT Time Calculation (min): 31 min  Visit#: 2 of 17  Re-eval: 05/30/14 Assessment Diagnosis: Bilateral hip stiffness Surgical Date: 07/24/13 Next MD Visit: not til next year Authorization: Medicaid/Medicare/Aetna  Authorization Visit#: 2 of 10  Charges:  therex 30  Subjective: Symptoms/Limitations Symptoms: Pt states she is doing well.  Reports currently no pain and has been compliant with HEP. Pain Assessment Currently in Pain?: No/denies  Precautions/Restrictions  Precautions Precautions: None Precaution Comments: per patient or evaluating therapist  Exercise/Treatments Stretches Active Hamstring Stretch: 3 reps;30 seconds;Limitations Active Hamstring Stretch Limitations: standing 14" box bilaterally Hip Flexor Stretch: 3 reps;20 seconds;Limitations Hip Flexor Stretch Limitations: standing with 14" box Knee: Self-Stretch to increase Flexion: 3 reps;20 seconds;Limitations Knee: Self-Stretch Limitations: groin stretch ITB Stretch: 3 reps;20 seconds;Limitations ITB Stretch Limitations: on 8" step bilaterally Piriformis Stretch: 1 rep;60 seconds;Limitations Piriformis Stretch Limitations: seated bilaterally Gastroc Stretch: 3 reps;20 seconds;Limitations Gastroc Stretch Limitations: slant board Standing Other Standing Knee Exercises: 3D hip excursions     Physical Therapy Assessment and Plan PT Assessment and Plan Clinical Impression Statement: Introduced additional LE stretches per PT POC.  Pt with more tightness in Rt LE with difficulty obtaining hip flexor and groin stretch in this extremity.  Pt without c/o of pain, however weakness noted in Lt LE with single stance actvitiies.   PT Plan: Initial focus on increasing bilateral LE flexibility.  Add quadricep stretch next visit.     Problem List Patient Active Problem  List   Diagnosis Date Noted  . Stiffness of joint, not elsewhere classified, pelvic region and thigh 04/30/2014  . Muscle weakness (generalized) 04/30/2014  . Degenerative arthritis of left hip 07/17/2013  . HTN (hypertension) 02/05/2013  . Chest pain, atypical 02/05/2013  . Anxiety 02/05/2013  . Nicotine abuse 02/05/2013    PT - End of Session Activity Tolerance: Patient tolerated treatment well General Behavior During Therapy: WFL for tasks assessed/performed PT Plan of Care PT Home Exercise Plan: 3D hip excursion, gastroc, hip flexor, hamstring, and groin stretch. twice daily, 4x15 seconds Consulted and Agree with Plan of Care: Patient   Lurena Nida, PTA/CLT 05/12/2014, 3:14 PM

## 2014-05-14 ENCOUNTER — Ambulatory Visit (HOSPITAL_COMMUNITY)
Admission: RE | Admit: 2014-05-14 | Payer: Medicare HMO | Source: Ambulatory Visit | Attending: Orthopaedic Surgery | Admitting: Orthopaedic Surgery

## 2014-05-18 ENCOUNTER — Ambulatory Visit (HOSPITAL_COMMUNITY)
Admission: RE | Admit: 2014-05-18 | Discharge: 2014-05-18 | Disposition: A | Discharge status: Home / Self Care | Payer: Medicare HMO | Source: Ambulatory Visit | Attending: Orthopaedic Surgery | Admitting: Orthopaedic Surgery

## 2014-05-18 DIAGNOSIS — M169 Osteoarthritis of hip, unspecified: Secondary | ICD-10-CM | POA: Insufficient documentation

## 2014-05-18 DIAGNOSIS — M1610 Unilateral primary osteoarthritis, unspecified hip: Secondary | ICD-10-CM | POA: Diagnosis present

## 2014-05-18 DIAGNOSIS — M25659 Stiffness of unspecified hip, not elsewhere classified: Secondary | ICD-10-CM | POA: Insufficient documentation

## 2014-05-18 DIAGNOSIS — IMO0001 Reserved for inherently not codable concepts without codable children: Secondary | ICD-10-CM | POA: Insufficient documentation

## 2014-05-18 DIAGNOSIS — M6281 Muscle weakness (generalized): Secondary | ICD-10-CM | POA: Diagnosis not present

## 2014-05-18 NOTE — Progress Notes (Signed)
Physical Therapy Treatment Patient Details  Name: Kiara Little MRN: [id number] Date of Birth: November 08, 1935  Today's Date: 05/18/2014 Time: 6213-0865 PT Time Calculation (min): 43 min Charge TE [id number]  Visit#: 3 of 17  Re-eval: 05/30/14 Assessment Diagnosis: Bilateral hip stiffness Surgical Date: 07/24/13 Next MD Visit: not til next year Prior Therapy: Yes, follwoign surgery.   Authorization: Medicaid/Medicare/[employer]etna  Authorization Time Period:    Authorization Visit#: 3 of 10   Subjective: Symptoms/Limitations Symptoms: Pt stated compliance with HEP daily.  CUrrent pain scale 8/10 Bil hips today. Pain Assessment Currently in Pain?: Yes Pain Score: 8  Pain Location: Hip Pain Orientation: Right;Left  Precautions/Restrictions  Precautions Precautions: None Precaution Comments: per patient or evaluating therapist  Exercise/Treatments Stretches Active Hamstring Stretch: 3 reps;30 seconds;Limitations Active Hamstring Stretch Limitations: standing 14" box bilaterally Quad Stretch: 3 reps;30 seconds;Limitations Quad Stretch Limitations: prone with rope Hip Flexor Stretch: 3 reps;20 seconds;Limitations Hip Flexor Stretch Limitations: standing with 14" box Gastroc Stretch: 3 reps;20 seconds;Limitations Gastroc Stretch Limitations: slant board Standing Other Standing Knee Exercises: 3D hip excursions Supine Other Supine Knee Exercises: Manual psoas stretch on edge of mat with RAROM f/b 10" holds      Physical Therapy Assessment and Plan PT Assessment and Plan Clinical Impression Statement: Pt presented with forward lumbar flexion during standing and gait.  Pt explained importance of posture for pain relief.  Continued LE stretches with min cueing for form.  Added prone quad stretch and manual psoas stretch on edge of mat.  Pt shown pics of psoas muscle and explained action of musculature.  Pt with improved posture and gait at end of session with reports of decreased pain.    PT Plan: Initial focus on increasing bilateral LE flexibility.  Continue with standing, prone and supine hip flexion stretch to improve hip mobilty as well as gluteal strengthening.      Goals PT Short Term Goals PT Short Term Goal 1: Patient will be able to dorsiflex feet to >15 degrees to decrease early heel off durign gait PT Short Term Goal 1 - Progress: Progressing toward goal PT Short Term Goal 2: Patient willl be able to increase hip extension to >10 degrees to increase stride length PT Short Term Goal 2 - Progress: Progressing toward goal PT Short Term Goal 3: Patient will demsontrate increased hip internal rotation to >30 degrees bilaterally to improve deceleration during gait PT Short Term Goal 4: patient will demosntrate a negative 90/90 test, negative piriformis test, and negative ely test so patient can sit with ankel crossed over knee bilaterally PT Short Term Goal 4 - Progress: Progressing toward goal PT Short Term Goal 5: Patient will demosntrate a negaitive Ober's test indicating increased IT/band mobility to indicate increased Lt hip scar tissue mobility.  PT Short Term Goal 5 - Progress: Progressing toward goal PT Long Term Goals PT Long Term Goal 1: Patient will demsontrate hip Extension/aduction MMt 4/5 to indicate improved ability to perform sit to stand without UE assistance PT Long Term Goal 2: Patient will be able to ambualte >12minutnes without stopping for rest Long Term Goal 3: Patient will be able to lift 10lb from floor 10x with out pain and with good mechnics to demosntrate ability to perform normal husehold ADL's  Problem List Patient Active Problem List   Diagnosis Date Noted  . Stiffness of joint, not elsewhere classified, pelvic region and thigh 04/30/2014  . Muscle weakness (generalized) 04/30/2014  . Degenerative arthritis of left hip 07/17/2013  . HTN (hypertension)  02/05/2013  . Chest pain, atypical 02/05/2013  . Anxiety 02/05/2013  . Nicotine abuse  02/05/2013    PT - End of Session Activity Tolerance: Patient tolerated treatment well General Behavior During Therapy: [facility] for tasks assessed/performed  GP    Juel Burrow 05/18/2014, 3:30 PM

## 2014-05-20 ENCOUNTER — Ambulatory Visit (HOSPITAL_COMMUNITY)
Admission: RE | Admit: 2014-05-20 | Discharge: 2014-05-20 | Disposition: A | Discharge status: Home / Self Care | Payer: Medicare HMO | Source: Ambulatory Visit | Attending: Orthopaedic Surgery | Admitting: Orthopaedic Surgery

## 2014-05-20 DIAGNOSIS — IMO0001 Reserved for inherently not codable concepts without codable children: Secondary | ICD-10-CM | POA: Diagnosis not present

## 2014-05-20 NOTE — Progress Notes (Signed)
Physical Therapy Treatment Patient Details  Name: Kiara Little MRN: [id number] Date of Birth: 01-02-1936  Today's Date: 05/20/2014 Time: 4098-1191 PT Time Calculation (min): 42 min Charge: TE [id number]  Visit#: 0 of 17  Re-eval: 05/30/14    Authorization: Medicaid/Medicare/Aetna  Authorization Time Period:    Authorization Visit#: 4 of 10   Subjective: Symptoms/Limitations Symptoms: Pt stated she was sore following last session.  Currently pain free.   Pain Assessment Currently in Pain?: No/denies  Objective:   Exercise/Treatments Stretches Active Hamstring Stretch: 3 reps;30 seconds;Limitations Active Hamstring Stretch Limitations: 14 in box Hip Flexor Stretch: 3 reps;30 seconds;Limitations Hip Flexor Stretch Limitations: 8 in box hip flexor and groin st; Supine on edge of mat manual RROM 5" then stretch 3reps Bil Standing Extension: Limitations Standing Extension Limitations: 10 reps lumbar extension Quad Stretch: 3 reps;30 seconds;Limitations Quad Stretch Limitations: prone with rope Standing Other Standing Lumbar Exercises: Gastroc Slant board 3x 30" Prone  Straight Leg Raise: 10 reps     Physical Therapy Assessment and Plan PT Assessment and Plan Clinical Impression Statement: Session focus on improving awareness with posture, improve hip mobility and gluteal strengthening to improve gait.  Added standing lumbar extension and prone SLR for gluteal strengthening with therapist facilitation for proper form, technique and correct musculature activation.  Pt able to demonstrate improved posture with gait with decreased forward flexion at hip.  No reports of pain.   PT Plan: Initial focus on increasing bilateral LE flexibility.  Continue with standing, prone and supine hip flexion stretch to improve hip mobilty as well as gluteal strengthening.      Goals PT Short Term Goals PT Short Term Goal 1: Patient will be able to dorsiflex feet to >15 degrees to decrease early  heel off durign gait PT Short Term Goal 1 - Progress: Progressing toward goal PT Short Term Goal 2: Patient willl be able to increase hip extension to >10 degrees to increase stride length PT Short Term Goal 2 - Progress: Progressing toward goal PT Short Term Goal 3: Patient will demsontrate increased hip internal rotation to >30 degrees bilaterally to improve deceleration during gait PT Short Term Goal 4: patient will demosntrate a negative 90/90 test, negative piriformis test, and negative ely test so patient can sit with ankel crossed over knee bilaterally PT Short Term Goal 4 - Progress: Progressing toward goal PT Short Term Goal 5: Patient will demosntrate a negaitive Ober's test indicating increased IT/band mobility to indicate increased Lt hip scar tissue mobility.  PT Short Term Goal 5 - Progress: Progressing toward goal PT Long Term Goals PT Long Term Goal 1: Patient will demsontrate hip Extension/aduction MMt 4/5 to indicate improved ability to perform sit to stand without UE assistance PT Long Term Goal 1 - Progress: Progressing toward goal PT Long Term Goal 2: Patient will be able to ambualte >81minutnes without stopping for rest Long Term Goal 3: Patient will be able to lift 10lb from floor 10x with out pain and with good mechnics to demosntrate ability to perform normal husehold ADL's  Problem List Patient Active Problem List   Diagnosis Date Noted  . Stiffness of joint, not elsewhere classified, pelvic region and thigh 04/30/2014  . Muscle weakness (generalized) 04/30/2014  . Degenerative arthritis of left hip 07/17/2013  . HTN (hypertension) 02/05/2013  . Chest pain, atypical 02/05/2013  . Anxiety 02/05/2013  . Nicotine abuse 02/05/2013    PT - End of Session Activity Tolerance: Patient tolerated treatment well General Behavior During Therapy:  WFL for tasks assessed/performed  GP    Juel Burrow 05/20/2014, 4:11 PM

## 2014-05-25 ENCOUNTER — Ambulatory Visit (HOSPITAL_COMMUNITY)
Admission: RE | Admit: 2014-05-25 | Discharge: 2014-05-25 | Disposition: A | Discharge status: Home / Self Care | Payer: Medicare HMO | Source: Ambulatory Visit | Attending: Orthopaedic Surgery | Admitting: Orthopaedic Surgery

## 2014-05-25 DIAGNOSIS — IMO0001 Reserved for inherently not codable concepts without codable children: Secondary | ICD-10-CM | POA: Diagnosis not present

## 2014-05-25 NOTE — Progress Notes (Signed)
Physical Therapy Treatment Patient Details  Name: Kiara Little MRN: [id number] Date of Birth: 12-24-1935  Today's Date: 05/25/2014 Time: 5284-1324 PT Time Calculation (min): 39 min Charge: TE [id number]  Visit#: 5 of 17  Re-eval: 05/30/14 Assessment Diagnosis: Bilateral hip stiffness Surgical Date: 07/24/13 Next MD Visit: not til next year Prior Therapy: Yes, follwoign surgery.   Authorization: Medicaid/Medicare/Aetna  Authorization Time Period:    Authorization Visit#: 5 of 10   Subjective: Symptoms/Limitations Symptoms: Pain free today, pt reported going to family reunion and cued a family member of posture. Pain Assessment Currently in Pain?: No/denies  Precautions/Restrictions  Precautions Precautions: None Precaution Comments: per patient or evaluating therapist  Exercise/Treatments Stretches Active Hamstring Stretch: 3 reps;30 seconds;Limitations Active Hamstring Stretch Limitations: standing 14" box bilaterally Quad Stretch: 3 reps;30 seconds;Limitations Quad Stretch Limitations: prone with rope Hip Flexor Stretch: 3 reps;20 seconds;Limitations Hip Flexor Stretch Limitations: standing with 14" box Knee: Self-Stretch to increase Flexion: 3 reps;20 seconds;Limitations Knee: Self-Stretch Limitations: groin stretch Gastroc Stretch: 3 reps;30 seconds;Limitations Gastroc Stretch Limitations: slant board Standing Other Standing Knee Exercises: 3D hip excursions Prone  Hip Extension: Both;10 reps      Physical Therapy Assessment and Plan PT Assessment and Plan Clinical Impression Statement: Continued session focus on improving hip mobility with primary focus on psoas and over hip flexor musculature stretches and gluteal strengthening.  Reduced cueing required for posture, pt able to verbalize importance of proper posture.  Added rocker board to improve weight distrubtion with gait.  Therapist facilitation for proper form with prone exercises for correct musculature  activation.   PT Plan: Initial focus on increasing bilateral LE flexibility.  Continue with standing, prone and supine hip flexion stretch to improve hip mobilty as well as gluteal strengthening.      Goals PT Short Term Goals PT Short Term Goal 1: Patient will be able to dorsiflex feet to >15 degrees to decrease early heel off durign gait PT Short Term Goal 1 - Progress: Progressing toward goal PT Short Term Goal 2: Patient willl be able to increase hip extension to >10 degrees to increase stride length PT Short Term Goal 2 - Progress: Progressing toward goal PT Short Term Goal 3: Patient will demsontrate increased hip internal rotation to >30 degrees bilaterally to improve deceleration during gait PT Short Term Goal 3 - Progress: Progressing toward goal PT Short Term Goal 4: patient will demosntrate a negative 90/90 test, negative piriformis test, and negative ely test so patient can sit with ankel crossed over knee bilaterally PT Short Term Goal 4 - Progress: Progressing toward goal  Problem List Patient Active Problem List   Diagnosis Date Noted  . Stiffness of joint, not elsewhere classified, pelvic region and thigh 04/30/2014  . Muscle weakness (generalized) 04/30/2014  . Degenerative arthritis of left hip 07/17/2013  . HTN (hypertension) 02/05/2013  . Chest pain, atypical 02/05/2013  . Anxiety 02/05/2013  . Nicotine abuse 02/05/2013    PT - End of Session Activity Tolerance: Patient tolerated treatment well General Behavior During Therapy: [facility] for tasks assessed/performed  GP    Juel Burrow 05/25/2014, 3:42 PM

## 2014-05-27 ENCOUNTER — Inpatient Hospital Stay (HOSPITAL_COMMUNITY): Admission: RE | Admit: 2014-05-27 | Payer: Medicare Other | Source: Ambulatory Visit

## 2014-06-01 ENCOUNTER — Ambulatory Visit (HOSPITAL_COMMUNITY)
Admission: RE | Admit: 2014-06-01 | Discharge: 2014-06-01 | Disposition: A | Discharge status: Home / Self Care | Payer: Medicare HMO | Source: Ambulatory Visit | Attending: Orthopaedic Surgery | Admitting: Orthopaedic Surgery

## 2014-06-01 DIAGNOSIS — IMO0001 Reserved for inherently not codable concepts without codable children: Secondary | ICD-10-CM | POA: Diagnosis not present

## 2014-06-01 NOTE — Progress Notes (Signed)
Physical Therapy Treatment Patient Details  Name: Kiara Little MRN: [id number] Date of Birth: 24-Oct-1935  Today's Date: 06/01/2014 Time: 1352-1440 PT Time Calculation (min): 48 min  Visit#: 6 of 17  Re-eval: 05/30/14 Authorization: Medicaid/Medicare/Aetna  Authorization Visit#: 6 of 10  Charges: therex 48  Subjective: Symptoms/Limitations Symptoms: Pt states she is not hurting today but is tired.  Pt states the stretches are helping. Pain Assessment Currently in Pain?: No/denies   Exercise/Treatments Stretches Active Hamstring Stretch: 3 reps;30 seconds;Limitations Active Hamstring Stretch Limitations: standing 14" box bilaterally Quad Stretch: 3 reps;30 seconds;Limitations Quad Stretch Limitations: prone with rope Hip Flexor Stretch: 3 reps;30 seconds Hip Flexor Stretch Limitations: standing with 14" box Knee: Self-Stretch to increase Flexion: 3 reps;30 seconds;Limitations Knee: Self-Stretch Limitations: groin stretch Gastroc Stretch: 3 reps;30 seconds;Limitations Gastroc Stretch Limitations: slant board Standing Other Standing Knee Exercises: 3D hip excursions Prone  Hip Extension: 2 sets;10 reps      Physical Therapy Assessment and Plan PT Assessment and Plan Clinical Impression Statement: Focus continued on increasing hip moblitiy.  continued tightness in hip flexors.  Encoruaged patient to lay prone more often at home to increase lumbar extension and hip flexor mobiltiy.  PT verbalized understanding.  Pt continues to require therapist facilitation with stretches to complete in proper form and count.  PT Plan: Initial focus on increasing bilateral LE flexibility.  Continue with standing, prone and supine hip flexion stretch to improve hip mobilty as well as gluteal strengthening.       Problem List Patient Active Problem List   Diagnosis Date Noted  . Stiffness of joint, not elsewhere classified, pelvic region and thigh 04/30/2014  . Muscle weakness  (generalized) 04/30/2014  . Degenerative arthritis of left hip 07/17/2013  . HTN (hypertension) 02/05/2013  . Chest pain, atypical 02/05/2013  . Anxiety 02/05/2013  . Nicotine abuse 02/05/2013    PT - End of Session Activity Tolerance: Patient tolerated treatment well General Behavior During Therapy: [facility] for tasks assessed/performed   Lurena Nida, PTA/CLT 06/01/2014, 2:39 PM

## 2014-06-03 ENCOUNTER — Ambulatory Visit (HOSPITAL_COMMUNITY)
Admission: RE | Admit: 2014-06-03 | Discharge: 2014-06-03 | Disposition: A | Discharge status: Home / Self Care | Payer: Medicare HMO | Source: Ambulatory Visit | Attending: Orthopaedic Surgery | Admitting: Orthopaedic Surgery

## 2014-06-03 DIAGNOSIS — IMO0001 Reserved for inherently not codable concepts without codable children: Secondary | ICD-10-CM | POA: Diagnosis not present

## 2014-06-03 NOTE — Evaluation (Signed)
Physical Therapy Re-assessment  Patient Details  Name: Kiara Little MRN: [id number] Date of Birth: 08-13-1936  Today's Date: 06/03/2014 Time: 1520-1600 PT Time Calculation (min): 40 min     Charges: TE 1520-1550, MMT1, ROMM1         Visit#: 7 of 17  Re-eval:   Assessment Diagnosis: Bilateral hip stiffness Surgical Date: 07/24/13 Next MD Visit: not til next year Prior Therapy: Yes, follwoign surgery.   Authorization: Medicaid/Medicare/Aetna    Authorization Time Period:    Authorization Visit#: 7 of 10   Past Medical History:  Past Medical History  Diagnosis Date  . Muscle spasm   . Hypertension   . Tobacco abuse   . Noncompliance   . Chest pain   . Pain     BOTH FEET "TIGHTNESS" "NO NERVE PAIN" - TAKES GABAPENTIN  . Arthritis     OA LEFT HIP   . Osteoarthritis     a. s/p R THA   Past Surgical History:  Past Surgical History  Procedure Laterality Date  . Back surgery    . Joint replacement  2000    RIGHT TOTAL HIP ARTHROPLASTY  . Surgery for tubal pregnancy    . Total hip arthroplasty Left 07/24/2013    Procedure: LEFT TOTAL HIP ARTHROPLASTY ANTERIOR APPROACH;  Surgeon: Mcarthur Rossetti, MD;  Location: [facility];  Service: Orthopedics;  Laterality: Left;    Subjective Symptoms/Limitations Symptoms: Patient states she has been feeling great just stiff all over.  Pain Assessment Currently in Pain?: No/denies  Precautions/Restrictions  Precautions Precautions: None Precaution Comments: per patient or evaluating therapist  Cognition/Observation Observation/Other Assessments Observations: Gait: excessive toe out, pes planus, limited tibial internal rotatien (most limited on Lt), very limited hip Frontal plane and transverse plane sway, limited hip extesnion, early heel rise.   Sensation/Coordination/Flexibility/Functional Tests Flexibility Thomas: Positive Obers: Negative 90/90: Negative Functional Tests Functional Tests: [place]:  Assessment RLE AROM  (degrees) RLE Overall AROM Comments: 40 degrees hip IR/ER Right Ankle Dorsiflexion: 12 RLE Strength Right Hip Flexion: 5/5 Right Hip External Rotation : 36 Right Hip Internal Rotation : 36 Right Knee Flexion: 4/5 Right Knee Extension: 5/5 Right Ankle Dorsiflexion: 5/5 LLE AROM (degrees) Left Hip External Rotation : 35 Left Hip Internal Rotation : 25 Left Ankle Dorsiflexion: 15 LLE Strength Left Hip Flexion: 4/5 Left Hip Extension: 2+/5 Left Hip ABduction: 3+/5 Left Knee Flexion: 4/5 Left Knee Extension: 5/5 Left Ankle Dorsiflexion: 5/5  Exercise/Treatments Stretches Active Hamstring Stretch: 3 reps;30 seconds;Limitations Active Hamstring Stretch Limitations: standing 14" box bilaterally Quad Stretch: 3 reps;30 seconds;Limitations Quad Stretch Limitations: prone with rope Hip Flexor Stretch: 3 reps;30 seconds Hip Flexor Stretch Limitations: standing with 14" box Knee: Self-Stretch to increase Flexion: 3 reps;30 seconds;Limitations Knee: Self-Stretch Limitations: groin stretch ITB Stretch: 3 reps;20 seconds;Limitations ITB Stretch Limitations: on 8" step bilaterally Piriformis Stretch: 1 rep;60 seconds;Limitations Piriformis Stretch Limitations: seated bilaterally Gastroc Stretch: 3 reps;30 seconds;Limitations Gastroc Stretch Limitations: slant board Standing Other Standing Knee Exercises: 3D hip excursions  Physical Therapy Assessment and Plan PT Assessment and Plan Clinical Impression Statement: patient displays improving mobility in bilateral LE. Patient continues to have minor limitations in ROM with strenght patient's primary limiting factor resulting in abnormal gait. Patient will benefit from conitnued physical therapy with focus on strengthening exercises to increase squatability so paptient can lift 10ld from the floor. PT Plan: Continue stretches as needed,  focus to shift to abdominal, hamstring, gluteal strengthening in addition to hip flexor rectus femoris  stretching.  Goals PT Short Term Goals PT Short Term Goal 1: Patient will be able to dorsiflex feet to >15 degrees to decrease early heel off durign gait PT Short Term Goal 1 - Progress: Progressing toward goal PT Short Term Goal 2: Patient willl be able to increase hip extension to >10 degrees to increase stride length PT Short Term Goal 2 - Progress: Progressing toward goal PT Short Term Goal 3: Patient will demsontrate increased hip internal rotation to >30 degrees bilaterally to improve deceleration during gait PT Short Term Goal 3 - Progress: Partly met PT Short Term Goal 4: patient will demosntrate a negative 90/90 test, negative piriformis test, and negative ely test so patient can sit with ankel crossed over knee bilaterally PT Short Term Goal 4 - Progress: Met PT Short Term Goal 5: Patient will demosntrate a negaitive Ober's test indicating increased IT/band mobility to indicate increased Lt hip scar tissue mobility.  PT Short Term Goal 5 - Progress: Met PT Long Term Goals PT Long Term Goal 1: Patient will demsontrate hip Extension/aduction MMt 4/5 to indicate improved ability to perform sit to stand without UE assistance PT Long Term Goal 1 - Progress: Progressing toward goal PT Long Term Goal 2: Patient will be able to ambualte >61minutnes without stopping for rest PT Long Term Goal 2 - Progress: Progressing toward goal Long Term Goal 3: Patient will be able to lift 10lb from floor 10x with out pain and with good mechnics to demosntrate ability to perform normal husehold ADL's Long Term Goal 3 Progress: Progressing toward goal  Problem List Patient Active Problem List   Diagnosis Date Noted  . Stiffness of joint, not elsewhere classified, pelvic region and thigh 04/30/2014  . Muscle weakness (generalized) 04/30/2014  . Degenerative arthritis of left hip 07/17/2013  . HTN (hypertension) 02/05/2013  . Chest pain, atypical 02/05/2013  . Anxiety 02/05/2013  . Nicotine abuse  02/05/2013    PT - End of Session Activity Tolerance: Patient tolerated treatment well General Behavior During Therapy: WFL for tasks assessed/performed  GP Functional Assessment Tool Used: FOTO 45% limited was 63% limited  Functional Limitation: Mobility: Walking and moving around Mobility: Walking and Moving Around Current Status ([id number]): At least 40 percent but less than 60 percent impaired, limited or restricted Mobility: Walking and Moving Around Goal Status [phone]): At least 20 percent but less than 40 percent impaired, limited or restricted  Leia Alf 06/03/2014, 4:08 PM  Physician Documentation Your signature is required to indicate approval of the treatment plan as stated above.  Please sign and either send electronically or make a copy of this report for your files and return this physician signed original.   Please mark one 1.__approve of plan  2. ___approve of plan with the following conditions.   ______________________________                                                          _____________________ Physician Signature  Date  

## 2014-06-08 ENCOUNTER — Ambulatory Visit (HOSPITAL_COMMUNITY)
Admission: RE | Admit: 2014-06-08 | Discharge: 2014-06-08 | Disposition: A | Discharge status: Home / Self Care | Payer: Medicare HMO | Source: Ambulatory Visit | Attending: Orthopaedic Surgery | Admitting: Orthopaedic Surgery

## 2014-06-08 DIAGNOSIS — IMO0001 Reserved for inherently not codable concepts without codable children: Secondary | ICD-10-CM | POA: Diagnosis not present

## 2014-06-08 NOTE — Progress Notes (Signed)
Physical Therapy Treatment Patient Details  Name: Kiara Little MRN: [id number] Date of Birth: 23-Mar-1936  Today's Date: 06/08/2014 Time: 6213-0865 PT Time Calculation (min): 33 min Charge: TE [id number]  Visit#: 8 of 17  Re-eval: 07/01/14 Assessment Diagnosis: Bilateral hip stiffness Surgical Date: 07/24/13 Next MD Visit: not til next year Prior Therapy: Yes, follwoign surgery.   Authorization: Medicaid/Medicare/[employer]  Authorization Time Period:    Authorization Visit#: 8 of 17   Subjective: Symptoms/Limitations Symptoms: Pt 20 minutes late for apt today.  Currently pain free Pain Assessment Currently in Pain?: No/denies  Precautions/Restrictions  Precautions Precautions: None Precaution Comments: per patient or evaluating therapist  Exercise/Treatments Stretches Lobbyist: 3 reps;30 seconds;Limitations Lobbyist Limitations: prone with rope Hip Flexor Stretch: 3 reps;30 seconds Hip Flexor Stretch Limitations: standing with 14" box Knee: Self-Stretch to increase Flexion: 3 reps;30 seconds;Limitations Knee: Self-Stretch Limitations: groin stretch Piriformis Stretch: 1 rep;60 seconds;Limitations Piriformis Stretch Limitations: seated bilaterally Gastroc Stretch: 3 reps;30 seconds;Limitations Gastroc Stretch Limitations: slant board Standing Other Standing Knee Exercises: 3D hip excursions    Physical Therapy Assessment and Plan PT Assessment and Plan Clinical Impression Statement: Unable to complete full POC due to pt. running late.  Session focus on improving gltueal strengthening and stretches to improve lengthening of rectus femoris and psoas musculature.  Therapist facilition to improve stretches with noted improved posture at end of session. PT Plan: Continue stretches as needed,  focus to shift to abdominal, hamstring, gluteal strengthening in addition to hip flexor rectus femoris stretching.      Goals PT Short Term Goals PT Short Term Goal 1: Patient  will be able to dorsiflex feet to >15 degrees to decrease early heel off durign gait PT Short Term Goal 1 - Progress: Progressing toward goal PT Short Term Goal 2: Patient willl be able to increase hip extension to >10 degrees to increase stride length PT Short Term Goal 2 - Progress: Progressing toward goal PT Short Term Goal 3: Patient will demsontrate increased hip internal rotation to >30 degrees bilaterally to improve deceleration during gait PT Short Term Goal 4: patient will demosntrate a negative 90/90 test, negative piriformis test, and negative ely test so patient can sit with ankel crossed over knee bilaterally PT Short Term Goal 5: Patient will demosntrate a negaitive Ober's test indicating increased IT/band mobility to indicate increased Lt hip scar tissue mobility.  PT Long Term Goals PT Long Term Goal 1: Patient will demsontrate hip Extension/aduction MMt 4/5 to indicate improved ability to perform sit to stand without UE assistance PT Long Term Goal 1 - Progress: Progressing toward goal PT Long Term Goal 2: Patient will be able to ambualte >43minutnes without stopping for rest Long Term Goal 3: Patient will be able to lift 10lb from floor 10x with out pain and with good mechnics to demosntrate ability to perform normal husehold ADL's  Problem List Patient Active Problem List   Diagnosis Date Noted  . Stiffness of joint, not elsewhere classified, pelvic region and thigh 04/30/2014  . Muscle weakness (generalized) 04/30/2014  . Degenerative arthritis of left hip 07/17/2013  . HTN (hypertension) 02/05/2013  . Chest pain, atypical 02/05/2013  . Anxiety 02/05/2013  . Nicotine abuse 02/05/2013    PT - End of Session Activity Tolerance: Patient tolerated treatment well General Behavior During Therapy: [facility] for tasks assessed/performed  GP    Juel Burrow 06/08/2014, 4:12 PM

## 2014-06-11 ENCOUNTER — Ambulatory Visit (HOSPITAL_COMMUNITY)
Admission: RE | Admit: 2014-06-11 | Payer: Medicare HMO | Source: Ambulatory Visit | Attending: Orthopaedic Surgery | Admitting: Orthopaedic Surgery

## 2014-06-17 ENCOUNTER — Other Ambulatory Visit: Payer: Self-pay | Admitting: Internal Medicine

## 2014-06-17 ENCOUNTER — Other Ambulatory Visit: Payer: Self-pay

## 2014-06-17 DIAGNOSIS — Z1239 Encounter for other screening for malignant neoplasm of breast: Secondary | ICD-10-CM

## 2014-06-29 ENCOUNTER — Ambulatory Visit
Admission: RE | Admit: 2014-06-29 | Discharge: 2014-06-29 | Disposition: A | Discharge status: Home / Self Care | Payer: Medicare HMO | Source: Ambulatory Visit

## 2014-06-29 ENCOUNTER — Encounter (INDEPENDENT_AMBULATORY_CARE_PROVIDER_SITE_OTHER): Payer: Self-pay

## 2014-06-29 ENCOUNTER — Ambulatory Visit
Admission: RE | Admit: 2014-06-29 | Discharge: 2014-06-29 | Disposition: A | Discharge status: Home / Self Care | Payer: Medicare HMO | Source: Ambulatory Visit | Attending: Internal Medicine | Admitting: Internal Medicine

## 2014-06-29 DIAGNOSIS — Z1239 Encounter for other screening for malignant neoplasm of breast: Secondary | ICD-10-CM

## 2014-06-29 DIAGNOSIS — E2839 Other primary ovarian failure: Secondary | ICD-10-CM

## 2014-07-08 ENCOUNTER — Encounter: Payer: Self-pay | Admitting: Vascular Surgery

## 2014-07-08 ENCOUNTER — Other Ambulatory Visit: Payer: Self-pay | Admitting: *Deleted

## 2014-07-08 DIAGNOSIS — I83892 Varicose veins of left lower extremities with other complications: Secondary | ICD-10-CM

## 2014-07-16 ENCOUNTER — Encounter: Payer: Self-pay | Admitting: Vascular Surgery

## 2014-07-19 ENCOUNTER — Encounter: Payer: Self-pay | Admitting: Vascular Surgery

## 2014-07-19 ENCOUNTER — Encounter (HOSPITAL_COMMUNITY): Payer: Medicare HMO

## 2014-07-27 ENCOUNTER — Encounter (HOSPITAL_COMMUNITY): Payer: Medicare HMO

## 2014-07-27 ENCOUNTER — Encounter: Payer: Medicare HMO | Admitting: Vascular Surgery

## 2014-07-30 ENCOUNTER — Encounter: Payer: Self-pay | Admitting: Vascular Surgery

## 2014-08-02 ENCOUNTER — Ambulatory Visit (INDEPENDENT_AMBULATORY_CARE_PROVIDER_SITE_OTHER): Payer: Medicare HMO | Admitting: Vascular Surgery

## 2014-08-02 ENCOUNTER — Encounter: Payer: Self-pay | Admitting: Vascular Surgery

## 2014-08-02 ENCOUNTER — Ambulatory Visit (HOSPITAL_COMMUNITY)
Admission: RE | Admit: 2014-08-02 | Discharge: 2014-08-02 | Disposition: A | Discharge status: Home / Self Care | Payer: Medicare HMO | Source: Ambulatory Visit | Attending: Vascular Surgery | Admitting: Vascular Surgery

## 2014-08-02 VITALS — BP 149/80 | HR 55 | Resp 16 | Ht 69.0 in | Wt 201.0 lb

## 2014-08-02 DIAGNOSIS — M25569 Pain in unspecified knee: Secondary | ICD-10-CM | POA: Insufficient documentation

## 2014-08-02 DIAGNOSIS — I83892 Varicose veins of left lower extremities with other complications: Secondary | ICD-10-CM | POA: Diagnosis not present

## 2014-08-02 DIAGNOSIS — M25562 Pain in left knee: Secondary | ICD-10-CM

## 2014-08-02 NOTE — Progress Notes (Signed)
Subjective:     Patient ID: Kiara Little, female   DOB: 01/26/1936, 78 y.o.   MRN: [id number]  HPIthis 78 year old female was referred by Lorin PicketScott long PA for evaluation of left leg pain with possible varicose veins. Patient denies any history of DVT, thrombophlebitis, stasis ulcers, or bleeding. She does have mild swelling in both ankles as the day progresses. She does not wear elastic compression stockings nor elevate her legs. She has not noticed any bulging varicosities. She states that the leg discomfort which was noted on physical exam a few weeks ago has subsided significantly. It was in the lower leg on the anterior aspect on the left. She has had a left hip replacement 1 year ago.  Past Medical History  Diagnosis Date  . Muscle spasm   . Hypertension   . Tobacco abuse   . Noncompliance   . Chest pain   . Pain     BOTH FEET "TIGHTNESS" "NO NERVE PAIN" - TAKES GABAPENTIN  . Arthritis     OA LEFT HIP   . Osteoarthritis     a. s/p R THA    History  Substance Use Topics  . Smoking status: Current Every Day Smoker -- 0.50 packs/day for 40 years    Types: Cigarettes  . Smokeless tobacco: Not on file  . Alcohol Use: No    Family History  Problem Relation Age of Onset  . Heart attack Mother     died @ 8174  . Diabetes Father     died in his 7960's  . Cancer Brother     deceased    Allergies  Allergen Reactions  . Ace Inhibitors Itching  . Other     SHELLFISH / SHRIMP - SWELLING MOUTH    Current outpatient prescriptions: amLODipine (NORVASC) 5 MG tablet, Take 5 mg by mouth every morning., Disp: , Rfl: ;  aspirin EC 325 MG EC tablet, Take 1 tablet (325 mg total) by mouth 2 (two) times daily after a meal., Disp: 45 tablet, Rfl: 0;  gabapentin (NEURONTIN) 300 MG capsule, Take 300 mg by mouth 3 (three) times daily., Disp: , Rfl: ;  Multiple Vitamin (MULTIVITAMIN WITH MINERALS) TABS tablet, Take 1 tablet by mouth daily., Disp: , Rfl:  OVER THE COUNTER MEDICATION, 1 tablet daily.,  Disp: , Rfl: ;  oxyCODONE-acetaminophen (ROXICET) 5-325 MG per tablet, Take one to two tablets by mouth every 4 hours as needed for severe pain, Disp: 360 tablet, Rfl: 0  BP 149/80 mmHg  Pulse 55  Resp 16  Ht 5\' 9"  (1.753 m)  Wt 201 lb (91.173 kg)  BMI 29.67 kg/m2  Body mass index is 29.67 kg/(m^2).           Review of Systems denies chest pain, dyspnea on exertion, PND, orthopnea, hemoptysis, lateralizing weakness, aphasia, amaurosis fugax, syncope. Patient does have bilateral hip discomfort which limits her ambulation. Other systems negative and a complete review of systems     Objective:   Physical Exam BP 149/80 mmHg  Pulse 55  Resp 16  Ht 5\' 9"  (1.753 m)  Wt 201 lb (91.173 kg)  BMI 29.67 kg/m2  Gen.-alert and oriented x3 in no apparent distress HEENT normal for age Lungs no rhonchi or wheezing Cardiovascular regular rhythm no murmurs carotid pulses 3+ palpable no bruits audible Abdomen soft nontender no palpable masses Musculoskeletal free of  major deformities Skin clear -no rashes Neurologic normal Lower extremities 3+ femoral and dorsalis pedis pulses palpable bilaterally with no edema  Small prominent vein in  left pretibial region distally on the lateral aspect Patient points to area and left anterior compartment distally where pain to palpation is located. She states this is much better than it was a few weeks ago when examined by her PA.no hyperpigmentation or ulceration noted and no significant spatter or reticular veins noted.   Today I ordered a venous duplex exam of the left leg which I reviewed and interpreted. There is no DVT. There is no superficial venous reflux. There is 1 very superficial varicosity noted in the left pretibial region which is quite small. There is no significant deep vein reflux.      Assessment:     No evidence of significant superficial or deep venous reflux. One prominent vein left leg which has history of tenderness not present  currently     Plan:     No treatment indicated and no further workup indicated Patient to return to see us on when necessary basis

## 2014-09-30 ENCOUNTER — Encounter (HOSPITAL_COMMUNITY): Payer: Self-pay | Admitting: Orthopaedic Surgery

## 2015-01-19 ENCOUNTER — Other Ambulatory Visit: Payer: Self-pay | Admitting: Orthopaedic Surgery

## 2015-01-19 DIAGNOSIS — M25562 Pain in left knee: Secondary | ICD-10-CM

## 2015-01-20 ENCOUNTER — Ambulatory Visit
Admission: RE | Admit: 2015-01-20 | Discharge: 2015-01-20 | Disposition: A | Discharge status: Home / Self Care | Payer: Medicare HMO | Source: Ambulatory Visit | Attending: Orthopaedic Surgery | Admitting: Orthopaedic Surgery

## 2015-01-20 DIAGNOSIS — M25562 Pain in left knee: Secondary | ICD-10-CM

## 2015-06-15 ENCOUNTER — Other Ambulatory Visit: Payer: Self-pay | Admitting: *Deleted

## 2015-06-15 DIAGNOSIS — T8189XA Other complications of procedures, not elsewhere classified, initial encounter: Secondary | ICD-10-CM

## 2015-07-15 ENCOUNTER — Encounter: Payer: Medicare HMO | Admitting: Vascular Surgery

## 2015-07-15 ENCOUNTER — Encounter (HOSPITAL_COMMUNITY): Payer: Medicare HMO

## 2015-07-22 ENCOUNTER — Encounter: Payer: Self-pay | Admitting: Vascular Surgery

## 2015-07-26 ENCOUNTER — Ambulatory Visit (INDEPENDENT_AMBULATORY_CARE_PROVIDER_SITE_OTHER): Payer: Medicare HMO | Admitting: Vascular Surgery

## 2015-07-26 ENCOUNTER — Encounter: Payer: Self-pay | Admitting: Vascular Surgery

## 2015-07-26 ENCOUNTER — Ambulatory Visit (HOSPITAL_COMMUNITY)
Admission: RE | Admit: 2015-07-26 | Discharge: 2015-07-26 | Disposition: A | Discharge status: Home / Self Care | Payer: Medicare HMO | Source: Ambulatory Visit | Attending: Vascular Surgery | Admitting: Vascular Surgery

## 2015-07-26 VITALS — BP 131/71 | HR 55 | Temp 98.4°F | Resp 16 | Ht 69.0 in | Wt 204.0 lb

## 2015-07-26 DIAGNOSIS — T8189XA Other complications of procedures, not elsewhere classified, initial encounter: Secondary | ICD-10-CM | POA: Diagnosis not present

## 2015-07-26 DIAGNOSIS — I739 Peripheral vascular disease, unspecified: Secondary | ICD-10-CM | POA: Insufficient documentation

## 2015-07-26 DIAGNOSIS — Y839 Surgical procedure, unspecified as the cause of abnormal reaction of the patient, or of later complication, without mention of misadventure at the time of the procedure: Secondary | ICD-10-CM | POA: Diagnosis not present

## 2015-07-26 NOTE — Progress Notes (Signed)
Subjective:     Patient ID: Kiara Little, female   DOB: Feb 17, 1936, 79 y.o.   MRN: [id number]  HPI this 79 year old female was [facility] by Dr. Illene Bolus for evaluation of circulation right leg. Patient has had procedures on toes of her right foot in the past 9 months. She had a nail removed from her right second toe about 6 or 7 months ago. After that she began having intermittent drainage from this area. The drainage has ceased and she has had no symptoms in the last 4-6 weeks. She's had no similar problems with the left foot. She has  had a chronic callus on the sole of the right foot however  Past Medical History  Diagnosis Date  . Muscle spasm   . Hypertension   . Tobacco abuse   . Noncompliance   . Chest pain   . Pain     BOTH FEET "TIGHTNESS" "NO NERVE PAIN" - TAKES GABAPENTIN  . Arthritis     OA LEFT HIP   . Osteoarthritis     a. s/p R THA    Social History  Substance Use Topics  . Smoking status: Former Smoker -- 0.50 packs/day for 40 years    Types: Cigarettes    Quit date: 07/24/2013  . Smokeless tobacco: Never Used  . Alcohol Use: No    Family History  Problem Relation Age of Onset  . Heart attack Mother     died @ 50  . Diabetes Father     died in his 23's  . Cancer Brother     deceased    Allergies  Allergen Reactions  . Ace Inhibitors Itching  . Other     SHELLFISH / SHRIMP - SWELLING MOUTH     Current outpatient prescriptions:  .  amLODipine (NORVASC) 5 MG tablet, Take 5 mg by mouth every morning., Disp: , Rfl:  .  aspirin EC 325 MG EC tablet, Take 1 tablet (325 mg total) by mouth 2 (two) times daily after a meal. (Patient not taking: Reported on 07/26/2015), Disp: 45 tablet, Rfl: 0 .  gabapentin (NEURONTIN) 300 MG capsule, Take 300 mg by mouth 3 (three) times daily., Disp: , Rfl:  .  Multiple Vitamin (MULTIVITAMIN WITH MINERALS) TABS tablet, Take 1 tablet by mouth daily., Disp: , Rfl:  .  OVER THE COUNTER MEDICATION, 1  tablet daily., Disp: , Rfl:  .  oxyCODONE-acetaminophen (ROXICET) 5-325 MG per tablet, Take one to two tablets by mouth every 4 hours as needed for severe pain (Patient not taking: Reported on 07/26/2015), Disp: 360 tablet, Rfl: 0  Filed Vitals:   07/26/15 1509  BP: 131/71  Pulse: 55  Temp: 98.4 F (36.9 C)  TempSrc: Oral  Resp: 16  Height:  (1.753 m)  Weight: 204 lb (92.534 kg)  SpO2: 99%    Body mass index is 30.11 kg/(m^2).        Marland Kitchen   Review of Systems denies chest pain but does have dyspnea on exertion which limits her to ambulating about 1 block. Has no claudication symptoms. Has history of osteoarthritis. Other systems negative and complete review of systems     Objective:   Physical Exam BP 131/71 mmHg  Pulse 55  Temp(Src) 98.4 F (36.9 C) (Oral)  Resp 16  Ht  (1.753 m)  Wt 204 lb (92.534 kg)  BMI 30.11 kg/m2  SpO2 99%  Gen.-alert and oriented x3 in no apparent distress HEENT normal for  age Lungs no rhonchi or wheezing Cardiovascular regular rhythm no murmurs carotid pulses 3+ palpable no bruits audible Abdomen soft nontender no palpable masses Musculoskeletal free of  major deformities Skin clear -no rashes Neurologic normal Lower extremities 3+ femoral pulses palpable bilaterally. No popliteal or distal pulses palpable. Both feet appear adequately perfused. The nail on the right second toe is partially regrown. There is no cellulitis, gangrene, or drainage. There is a well-healed callus on the plantar aspect of the right foot measuring about 1-1/2 cm in diameter which is not infected.  Today I ordered lower extremity arterial study with ABIs. ABI on the right posterior tibials 0.73 in the left dorsalis pedis 0.83 with brisk monophasic flow bilaterally.       Assessment:     History of slowly healing right second toenail bed following removal of nail 7 months ago-now completely healed Suspect superficial femoral and tibial occlusive disease  which is moderate in severity Osteoarthritis COPD    Plan:     No indication to proceed with further evaluation at this time since patient's toe is now healed and asymptomatic. She does not have limiting claudication. If she develops any limb threatening problem such as gangrene, drainage, cellulitis of the foot, or nonhealing ulcers we will need to proceed with further evaluation including angiography. She will return to see us on a when necessary basis

## 2016-05-03 ENCOUNTER — Emergency Department (HOSPITAL_COMMUNITY)
Admission: EM | Admit: 2016-05-03 | Discharge: 2016-05-03 | Disposition: A | Discharge status: Home / Self Care | Payer: Medicare HMO | Attending: Emergency Medicine | Admitting: Emergency Medicine

## 2016-05-03 ENCOUNTER — Encounter (HOSPITAL_COMMUNITY): Payer: Self-pay

## 2016-05-03 DIAGNOSIS — Z87891 Personal history of nicotine dependence: Secondary | ICD-10-CM | POA: Insufficient documentation

## 2016-05-03 DIAGNOSIS — T63444A Toxic effect of venom of bees, undetermined, initial encounter: Secondary | ICD-10-CM | POA: Insufficient documentation

## 2016-05-03 DIAGNOSIS — Z79899 Other long term (current) drug therapy: Secondary | ICD-10-CM | POA: Insufficient documentation

## 2016-05-03 DIAGNOSIS — I10 Essential (primary) hypertension: Secondary | ICD-10-CM | POA: Insufficient documentation

## 2016-05-03 MED ORDER — PREDNISONE 20 MG PO TABS: ORAL_TABLET | ORAL | 0 refills | Status: DC

## 2016-05-03 MED ORDER — PREDNISONE 50 MG PO TABS
60.0000 mg | ORAL_TABLET | Freq: Once | ORAL | Status: AC
  Administered 2016-05-03: 20:00:00 60 mg via ORAL
  Filled 2016-05-03: qty 1

## 2016-05-03 MED ORDER — DIPHENHYDRAMINE HCL 25 MG PO CAPS: 25.0000 mg | ORAL_CAPSULE | Freq: Four times a day (QID) | ORAL | 0 refills | Status: DC | PRN

## 2016-05-03 MED ORDER — FAMOTIDINE 20 MG PO TABS
20.0000 mg | ORAL_TABLET | Freq: Once | ORAL | Status: AC
  Administered 2016-05-03: 20 mg via ORAL
  Filled 2016-05-03: qty 1

## 2016-05-03 NOTE — ED Triage Notes (Signed)
Bee sting to posterior right thigh. Swelling and redness noted.

## 2016-05-03 NOTE — ED Notes (Signed)
Pt alert & oriented x4, stable gait. Patient given discharge instructions, paperwork & prescription(s). Patient  instructed to stop at the registration desk to finish any additional paperwork. Patient verbalized understanding. Pt left department w/ no further questions. 

## 2016-05-03 NOTE — ED Notes (Signed)
Pt states she was stung around 1330 today on her right leg. Denies any swelling to tongue or throat. Pt says leg is itching & hurts. Has taken any medication. NAD noted.

## 2016-05-04 NOTE — ED Provider Notes (Signed)
[facility]-EMER[facility]ENCY DEPT Provider Note   CSN: [id number] Arrival date & time: 05/03/16  1848     History   Chief Complaint Chief Complaint  Patient presents with  . Insect Bite    HPI Kiara Little is a 80 y.o. female.  Stung by a bee a few hours ago, swelling and pain to posterior right leg. No sob, syncope or GI symptoms. No modifying factors. No other associated symptoms.       Past Medical History:  Diagnosis Date  . Arthritis    OA LEFT HIP   . Chest pain   . Hypertension   . Muscle spasm   . Noncompliance   . Osteoarthritis    a. s/p R THA  . Pain    BOTH FEET "TIGHTNESS" "NO NERVE PAIN" - TAKES GABAPENTIN  . Tobacco abuse     Patient Active Problem List   Diagnosis Date Noted  . PAD (peripheral artery disease) (HCC) 07/26/2015  . Pain in joint, lower leg 08/02/2014  . Stiffness of joint, not elsewhere classified, pelvic region and thigh 04/30/2014  . Muscle weakness (generalized) 04/30/2014  . Degenerative arthritis of left hip 07/17/2013  . HTN (hypertension) 02/05/2013  . Chest pain, atypical 02/05/2013  . Anxiety 02/05/2013  . Nicotine abuse 02/05/2013    Past Surgical History:  Procedure Laterality Date  . BACK SURGERY    . JOINT REPLACEMENT  2000   RIGHT TOTAL HIP ARTHROPLASTY  . SURGERY FOR TUBAL PREGNANCY    . TOTAL HIP ARTHROPLASTY Left 07/24/2013   Procedure: LEFT TOTAL HIP ARTHROPLASTY ANTERIOR APPROACH;  Surgeon: Kathryne Hitchhristopher Y Blackman, MD;  Location: [facility];  Service: Orthopedics;  Laterality: Left;    OB History    No data available       Home Medications    Prior to Admission medications   Medication Sig Start Date End Date Taking? Authorizing Provider  amLODipine (NORVASC) 5 MG tablet Take 5 mg by mouth every morning.   Yes Historical Provider, MD  diphenhydrAMINE (BENADRYL) 25 mg capsule Take 1 capsule (25 mg total) by mouth every 6 (six) hours as needed for itching. 05/03/16   Kiara MemosJason Zoriyah Scheidegger, MD  predniSONE (DELTASONE) 20 MG  tablet 3 tabs po daily x 3 days, then 2 tabs x 3 days, then 1.5 tabs x 3 days, then 1 tab x 3 days, then 0.5 tabs x 3 days 05/03/16   Kiara MemosJason Isidra Mings, MD    Family History Family History  Problem Relation Age of Onset  . Heart attack Mother     died @ 3274  . Diabetes Father     died in his 6260's  . Cancer Brother     deceased    Social History Social History  Substance Use Topics  . Smoking status: Former Smoker    Packs/day: 0.50    Years: 40.00    Types: Cigarettes    Quit date: 07/24/2013  . Smokeless tobacco: Never Used  . Alcohol use No     Allergies   Review of patient's allergies indicates no known allergies.   Review of Systems Review of Systems  Skin: Positive for rash.  All other systems reviewed and are negative.    Physical Exam Updated Vital Signs BP 133/82 (BP Location: Left Arm)   Pulse 89   Temp 98.1 F (36.7 C) (Oral)   Resp 18   Ht 5\' 9"  (1.753 m)   Wt 220 lb (99.8 kg)   SpO2 98%   BMI 32.49 kg/m  Physical Exam  Constitutional: She appears well-developed and well-nourished. No distress.  HENT:  Head: Normocephalic and atraumatic.  Eyes: Conjunctivae are normal.  Neck: Neck supple.  Cardiovascular: Normal rate and regular rhythm.   No murmur heard. Pulmonary/Chest: Effort normal and breath sounds normal. No respiratory distress.  Abdominal: Soft. There is no tenderness.  Musculoskeletal: She exhibits no edema.  Neurological: She is alert.  Skin: Skin is warm and dry. Rash noted. There is erythema (right posterior leg with tenderness near the center).  Psychiatric: She has a normal mood and affect.  Nursing note and vitals reviewed.    ED Treatments / Results  Labs (all labs ordered are listed, but only abnormal results are displayed) Labs Reviewed - No data to display  EKG  EKG Interpretation None       Radiology No results found.  Procedures Procedures (including critical care time)  Medications Ordered in  ED Medications  famotidine (PEPCID) tablet 20 mg (20 mg Oral Given 05/03/16 1942)  predniSONE (DELTASONE) tablet 60 mg (60 mg Oral Given 05/03/16 1941)     Initial Impression / Assessment and Plan / ED Course  I have reviewed the triage vital signs and the nursing notes.  Pertinent labs & imaging results that were available during my care of the patient were reviewed by me and considered in my medical decision making (see chart for details).  Clinical Course    Bee sting with significant localized reaction. No obvious foreign bodies or stingers present. Plan for prednisone taper and pcp follow pu.   Final Clinical Impressions(s) / ED Diagnoses   Final diagnoses:  Bee sting, undetermined intent, initial encounter    New Prescriptions Discharge Medication List as of 05/03/2016  7:37 PM    START taking these medications   Details  diphenhydrAMINE (BENADRYL) 25 mg capsule Take 1 capsule (25 mg total) by mouth every 6 (six) hours as needed for itching., Starting Thu 05/03/2016, Print    predniSONE (DELTASONE) 20 MG tablet 3 tabs po daily x 3 days, then 2 tabs x 3 days, then 1.5 tabs x 3 days, then 1 tab x 3 days, then 0.5 tabs x 3 days, Print         Kiara MemosJason Kourtnee Lahey, MD 05/04/16 [phone]

## 2016-06-20 ENCOUNTER — Encounter (HOSPITAL_COMMUNITY): Payer: Self-pay | Admitting: Emergency Medicine

## 2016-06-20 ENCOUNTER — Emergency Department (HOSPITAL_COMMUNITY)
Admission: EM | Admit: 2016-06-20 | Discharge: 2016-06-20 | Disposition: A | Discharge status: Home / Self Care | Payer: Medicare HMO | Attending: Emergency Medicine | Admitting: Emergency Medicine

## 2016-06-20 DIAGNOSIS — L03115 Cellulitis of right lower limb: Secondary | ICD-10-CM | POA: Diagnosis not present

## 2016-06-20 DIAGNOSIS — I10 Essential (primary) hypertension: Secondary | ICD-10-CM | POA: Insufficient documentation

## 2016-06-20 DIAGNOSIS — Z87891 Personal history of nicotine dependence: Secondary | ICD-10-CM | POA: Diagnosis not present

## 2016-06-20 DIAGNOSIS — Z79899 Other long term (current) drug therapy: Secondary | ICD-10-CM | POA: Insufficient documentation

## 2016-06-20 DIAGNOSIS — M79604 Pain in right leg: Secondary | ICD-10-CM | POA: Diagnosis present

## 2016-06-20 DIAGNOSIS — Z7982 Long term (current) use of aspirin: Secondary | ICD-10-CM | POA: Diagnosis not present

## 2016-06-20 MED ORDER — SULFAMETHOXAZOLE-TRIMETHOPRIM 800-160 MG PO TABS
1.0000 | ORAL_TABLET | Freq: Once | ORAL | Status: AC
  Administered 2016-06-20: 1 via ORAL
  Filled 2016-06-20: qty 1

## 2016-06-20 MED ORDER — LIDOCAINE HCL (PF) 1 % IJ SOLN
INTRAMUSCULAR | Status: AC
  Administered 2016-06-20: 5 mL
  Filled 2016-06-20: qty 5

## 2016-06-20 MED ORDER — SULFAMETHOXAZOLE-TRIMETHOPRIM 800-160 MG PO TABS: 1.0000 | ORAL_TABLET | Freq: Two times a day (BID) | ORAL | 0 refills | Status: AC

## 2016-06-20 MED ORDER — HYDROCODONE-ACETAMINOPHEN 5-325 MG PO TABS: ORAL_TABLET | ORAL | 0 refills | Status: DC

## 2016-06-20 MED ORDER — CEFTRIAXONE SODIUM 1 G IJ SOLR
1.0000 g | Freq: Once | INTRAMUSCULAR | Status: AC
  Administered 2016-06-20: 1 g via INTRAMUSCULAR
  Filled 2016-06-20: qty 10

## 2016-06-20 NOTE — Discharge Instructions (Signed)
Elevate and apply warm wet compresses or soaks on/off to your leg.  Return here on Friday for recheck

## 2016-06-20 NOTE — ED Triage Notes (Signed)
Pt has nonhealing wound to rle from closing car door on it x 1 week ago.

## 2016-06-20 NOTE — ED Provider Notes (Signed)
[facility] Provider Note   CSN: [id number] Arrival date & time: 06/20/16  1839     History   Chief Complaint Chief Complaint  Patient presents with  . Sore    HPI Haset P Talmadge is a 80 y.o. female.  HPI   Analie P Konen is a 80 y.o. female who presents to the Emergency Department complaining of non healing wound to the right lower leg.  She states that she struck her leg on the bottom edge of a car door.  She states that the wound scabbed over, but reports increasing pain and redness of her leg.  Pain worse with weight bearing.  She has tried neosporin and cleaned it with peroxide.  She also complains of associated with swelling around the wound.  She denies posterior calf pain, fever, chills, numbness of the extremity and hx of DM   Past Medical History:  Diagnosis Date  . Arthritis    OA LEFT HIP   . Chest pain   . Hypertension   . Muscle spasm   . Noncompliance   . Osteoarthritis    a. s/p R THA  . Pain    BOTH FEET "TIGHTNESS" "NO NERVE PAIN" - TAKES GABAPENTIN  . Tobacco abuse     Patient Active Problem List   Diagnosis Date Noted  . PAD (peripheral artery disease) (HCC) 07/26/2015  . Pain in joint, lower leg 08/02/2014  . Stiffness of joint, not elsewhere classified, pelvic region and thigh 04/30/2014  . Muscle weakness (generalized) 04/30/2014  . Degenerative arthritis of left hip 07/17/2013  . HTN (hypertension) 02/05/2013  . Chest pain, atypical 02/05/2013  . Anxiety 02/05/2013  . Nicotine abuse 02/05/2013    Past Surgical History:  Procedure Laterality Date  . BACK SURGERY    . JOINT REPLACEMENT  2000   RIGHT TOTAL HIP ARTHROPLASTY  . SURGERY FOR TUBAL PREGNANCY    . TOTAL HIP ARTHROPLASTY Left 07/24/2013   Procedure: LEFT TOTAL HIP ARTHROPLASTY ANTERIOR APPROACH;  Surgeon: Kathryne Hitch, MD;  Location: [facility];  Service: Orthopedics;  Laterality: Left;    OB History    No data available       Home Medications    Prior  to Admission medications   Medication Sig Start Date End Date Taking? Authorizing Provider  amLODipine (NORVASC) 5 MG tablet Take 5 mg by mouth every morning.   Yes Historical Provider, MD  aspirin 325 MG tablet Take 975 mg by mouth daily.    Yes Historical Provider, MD  diphenhydrAMINE (BENADRYL) 25 mg capsule Take 1 capsule (25 mg total) by mouth every 6 (six) hours as needed for itching. Patient not taking: Reported on 06/20/2016 05/03/16   Marily Memos, MD  predniSONE (DELTASONE) 20 MG tablet 3 tabs po daily x 3 days, then 2 tabs x 3 days, then 1.5 tabs x 3 days, then 1 tab x 3 days, then 0.5 tabs x 3 days Patient not taking: Reported on 06/20/2016 05/03/16   Marily Memos, MD    Family History Family History  Problem Relation Age of Onset  . Heart attack Mother     died @ 70  . Diabetes Father     died in his 43's  . Cancer Brother     deceased    Social History Social History  Substance Use Topics  . Smoking status: Former Smoker    Packs/day: 0.50    Years: 40.00    Types: Cigarettes    Quit date: 07/24/2013  .  Smokeless tobacco: Never Used  . Alcohol use No     Allergies   Review of patient's allergies indicates no known allergies.   Review of Systems Review of Systems  Constitutional: Negative for chills and fever.  Gastrointestinal: Negative for nausea and vomiting.  Musculoskeletal: Negative for arthralgias and joint swelling.  Skin: Positive for color change.  Neurological: Negative for weakness and numbness.  Hematological: Negative for adenopathy.  All other systems reviewed and are negative.    Physical Exam Updated Vital Signs BP 159/87 (BP Location: Left Arm)   Pulse 71   Temp 97.7 F (36.5 C) (Oral)   Resp 18   Ht 5\' 9"  (1.753 m)   Wt 95.3 kg   SpO2 98%   BMI 31.01 kg/m   Physical Exam  Constitutional: She is oriented to person, place, and time. She appears well-developed and well-nourished. No distress.  HENT:  Head: Normocephalic and  atraumatic.  Cardiovascular: Normal rate and normal heart sounds.   No murmur heard. Pulmonary/Chest: Effort normal and breath sounds normal. No respiratory distress.  Musculoskeletal: Normal range of motion. She exhibits tenderness.  Right posterior lower leg is soft, NT  Neurological: She is alert and oriented to person, place, and time. She exhibits normal muscle tone. Coordination normal.  Skin: Skin is warm and dry. There is erythema.  Confluent erythema of the distal right lower leg with central crusted open to lateral aspect.  Mild LE edema.  Distal sensation intact,  DP pulse brisk.    Nursing note and vitals reviewed.    ED Treatments / Results  Labs (all labs ordered are listed, but only abnormal results are displayed) Labs Reviewed - No data to display  EKG  EKG Interpretation None       Radiology No results found.  Procedures Procedures (including critical care time)  Medications Ordered in ED Medications  cefTRIAXone (ROCEPHIN) injection 1 g (not administered)  sulfamethoxazole-trimethoprim (BACTRIM DS,SEPTRA DS) 800-160 MG per tablet 1 tablet (not administered)     Initial Impression / Assessment and Plan / ED Course  I have reviewed the triage vital signs and the nursing notes.  Pertinent labs & imaging results that were available during my care of the patient were reviewed by me and considered in my medical decision making (see chart for details).  Clinical Course   Pt with wound to the right lower leg with surrounding erythema.  NV intact.  No abscess, no concerning sx's for DVT.  Pt also seen by Dr. Estell HarpinZammit and care plan discussed.    Pt well appearing, non-toxic.  Ambulates with a steady gait.  Appears stable for d/c.  Agrees to elevate, warm compresses and ER return in 2 days for recheck   Final Clinical Impressions(s) / ED Diagnoses   Final diagnoses:  Cellulitis of right lower extremity    New Prescriptions Discharge Medication List as  of 06/20/2016  8:47 PM    START taking these medications   Details  HYDROcodone-acetaminophen (NORCO/VICODIN) 5-325 MG tablet Take one tab po q 4-6 hrs prn pain, Print    sulfamethoxazole-trimethoprim (BACTRIM DS,SEPTRA DS) 800-160 MG tablet Take 1 tablet by mouth 2 (two) times daily., Starting Wed 06/20/2016, Until Wed 06/27/2016, Print         Robynn Marcel Bear Lakeriplett, PA-C 06/24/16 2150    Bethann BerkshireJoseph Zammit, MD 06/25/16 1434

## 2016-06-22 ENCOUNTER — Encounter (HOSPITAL_COMMUNITY): Payer: Self-pay | Admitting: Emergency Medicine

## 2016-06-22 ENCOUNTER — Emergency Department (HOSPITAL_COMMUNITY)
Admission: EM | Admit: 2016-06-22 | Discharge: 2016-06-22 | Disposition: A | Discharge status: Home / Self Care | Payer: Medicare HMO | Attending: Emergency Medicine | Admitting: Emergency Medicine

## 2016-06-22 DIAGNOSIS — I10 Essential (primary) hypertension: Secondary | ICD-10-CM | POA: Insufficient documentation

## 2016-06-22 DIAGNOSIS — Z4800 Encounter for change or removal of nonsurgical wound dressing: Secondary | ICD-10-CM | POA: Insufficient documentation

## 2016-06-22 DIAGNOSIS — Z79899 Other long term (current) drug therapy: Secondary | ICD-10-CM | POA: Diagnosis not present

## 2016-06-22 DIAGNOSIS — Z87891 Personal history of nicotine dependence: Secondary | ICD-10-CM | POA: Diagnosis not present

## 2016-06-22 DIAGNOSIS — Z7982 Long term (current) use of aspirin: Secondary | ICD-10-CM | POA: Diagnosis not present

## 2016-06-22 DIAGNOSIS — Z5189 Encounter for other specified aftercare: Secondary | ICD-10-CM

## 2016-06-22 NOTE — Discharge Instructions (Signed)
Continue the antibiotics and return for worsening redness or swelling.

## 2016-06-22 NOTE — ED Triage Notes (Signed)
PT states she was closing her car door and hit the bottom outer right leg causes a open area. PT states she was seen in ED x2 days ago and was started on antibiotics and was told to come back to ED today for a recheck. PT states area has decreased in size and denies any fevers at home.

## 2016-06-22 NOTE — ED Provider Notes (Signed)
[facility] Provider Note   CSN: [id number] Arrival date & time: 06/22/16  1129   By signing my name below, I, Majel HomerPeyton Lee, attest that this documentation has been prepared under the direction and in the presence of Benjiman CoreNathan Shenicka Sunderlin, MD . Electronically Signed: Majel HomerPeyton Lee, Scribe. 06/22/2016. 12:23 PM.  History   Chief Complaint Chief Complaint  Patient presents with  . Cellulitis   The history is provided by the patient. No language interpreter was used.   HPI Comments: Kiara Little is a 80 y.o. female who presents to the Emergency Department for a re-cehck s/p closing her car door on her right lower leg 1.5 weeks ago. Pt reports she was closing her car door and accidentally hit the bottom of her right lower leg creating an open area on her leg. She states she was seen in the ED 2 days ago for her non-healing wound and was prescribed antibiotics and told to come back to the ED for a re-check. She notes the area surrounding her wound has decreased in size and appears "better" than before. She denies drainage from her wound, fever and chills.   Past Medical History:  Diagnosis Date  . Arthritis    OA LEFT HIP   . Chest pain   . Hypertension   . Muscle spasm   . Noncompliance   . Osteoarthritis    a. s/p R THA  . Pain    BOTH FEET "TIGHTNESS" "NO NERVE PAIN" - TAKES GABAPENTIN  . Tobacco abuse     Patient Active Problem List   Diagnosis Date Noted  . PAD (peripheral artery disease) (HCC) 07/26/2015  . Pain in joint, lower leg 08/02/2014  . Stiffness of joint, not elsewhere classified, pelvic region and thigh 04/30/2014  . Muscle weakness (generalized) 04/30/2014  . Degenerative arthritis of left hip 07/17/2013  . HTN (hypertension) 02/05/2013  . Chest pain, atypical 02/05/2013  . Anxiety 02/05/2013  . Nicotine abuse 02/05/2013    Past Surgical History:  Procedure Laterality Date  . BACK SURGERY    . JOINT REPLACEMENT  2000   RIGHT TOTAL HIP ARTHROPLASTY  .  SURGERY FOR TUBAL PREGNANCY    . TOTAL HIP ARTHROPLASTY Left 07/24/2013   Procedure: LEFT TOTAL HIP ARTHROPLASTY ANTERIOR APPROACH;  Surgeon: Kathryne Hitchhristopher Y Blackman, MD;  Location: [facility];  Service: Orthopedics;  Laterality: Left;    OB History    Gravida Para Term Preterm AB Living   9         6   SAB TAB Ectopic Multiple Live Births                   Home Medications    Prior to Admission medications   Medication Sig Start Date End Date Taking? Authorizing Provider  amLODipine (NORVASC) 5 MG tablet Take 5 mg by mouth every morning.    Historical Provider, MD  aspirin 325 MG tablet Take 975 mg by mouth daily.     Historical Provider, MD  diphenhydrAMINE (BENADRYL) 25 mg capsule Take 1 capsule (25 mg total) by mouth every 6 (six) hours as needed for itching. Patient not taking: Reported on 06/20/2016 05/03/16   Marily MemosJason Mesner, MD  HYDROcodone-acetaminophen (NORCO/VICODIN) 5-325 MG tablet Take one tab po q 4-6 hrs prn pain 06/20/16   Tammy Triplett, PA-C  predniSONE (DELTASONE) 20 MG tablet 3 tabs po daily x 3 days, then 2 tabs x 3 days, then 1.5 tabs x 3 days, then 1 tab x 3 days,  then 0.5 tabs x 3 days Patient not taking: Reported on 06/20/2016 05/03/16   Marily Memos, MD  sulfamethoxazole-trimethoprim (BACTRIM DS,SEPTRA DS) 800-160 MG tablet Take 1 tablet by mouth 2 (two) times daily. 06/20/16 06/27/16  Pauline Aus, PA-C    Family History Family History  Problem Relation Age of Onset  . Heart attack Mother     died @ 81  . Diabetes Father     died in his 40's  . Cancer Brother     deceased    Social History Social History  Substance Use Topics  . Smoking status: Former Smoker    Packs/day: 0.50    Years: 40.00    Types: Cigarettes    Quit date: 07/24/2013  . Smokeless tobacco: Never Used  . Alcohol use No     Allergies   Review of patient's allergies indicates no known allergies.   Review of Systems Review of Systems  Constitutional: Negative for chills and fever.   Respiratory: Negative for shortness of breath.   Cardiovascular: Negative for chest pain.  Skin: Positive for wound.     Physical Exam Updated Vital Signs BP 125/77 (BP Location: Left Arm)   Pulse 67   Temp 98.1 F (36.7 C) (Oral)   Resp 18   Ht 5\' 9"  (1.753 m)   Wt 210 lb (95.3 kg)   SpO2 97%   BMI 31.01 kg/m   Physical Exam  Constitutional: She appears well-developed and well-nourished.  HENT:  Head: Normocephalic.  Pulmonary/Chest: Effort normal.  Abdominal: She exhibits no distension.  Musculoskeletal: Normal range of motion.  Neurological: She is alert.  Skin: There is erythema.  Central 1 cm scab surrounded by a 6 cm diameter erythematous area with mild induration, no drainage. Calf is non-tender. Good pulse in the foot. Mild edema of right  lower leg distal to the wound.   Psychiatric: She has a normal mood and affect.  Nursing note and vitals reviewed.   ED Treatments / Results  Labs (all labs ordered are listed, but only abnormal results are displayed) Labs Reviewed - No data to display  EKG  EKG Interpretation None       Radiology No results found.  Procedures Procedures (including critical care time)  Medications Ordered in ED Medications - No data to display  DIAGNOSTIC STUDIES:  Oxygen Saturation is 97% on RA, normal by my interpretation.    COORDINATION OF CARE:  12:21 PM Discussed treatment plan with pt at bedside and pt agreed to plan.  Initial Impression / Assessment and Plan / ED Course  I have reviewed the triage vital signs and the nursing notes.  Pertinent labs & imaging results that were available during my care of the patient were reviewed by me and considered in my medical decision making (see chart for details).  Clinical Course    Patient with cellulitis. Improved from previous according to patient. Is some distal edema likely related to infection. Doubt this is a DVT at this time. Will continue antibiotics. Discharge  home.  Final Clinical Impressions(s) / ED Diagnoses   Final diagnoses:  Visit for wound check    New Prescriptions New Prescriptions   No medications on file     Benjiman Core, MD 06/22/16 1258

## 2016-07-30 ENCOUNTER — Ambulatory Visit (HOSPITAL_COMMUNITY): Payer: Medicare HMO

## 2016-07-30 ENCOUNTER — Other Ambulatory Visit: Payer: Self-pay | Admitting: Family Medicine

## 2016-07-30 ENCOUNTER — Other Ambulatory Visit (HOSPITAL_COMMUNITY): Payer: Self-pay | Admitting: Family Medicine

## 2016-07-30 DIAGNOSIS — R2241 Localized swelling, mass and lump, right lower limb: Secondary | ICD-10-CM

## 2016-07-30 DIAGNOSIS — L03114 Cellulitis of left upper limb: Secondary | ICD-10-CM

## 2016-07-30 DIAGNOSIS — L03113 Cellulitis of right upper limb: Secondary | ICD-10-CM

## 2016-07-30 DIAGNOSIS — R2242 Localized swelling, mass and lump, left lower limb: Secondary | ICD-10-CM

## 2016-07-30 DIAGNOSIS — M79605 Pain in left leg: Secondary | ICD-10-CM

## 2016-07-31 ENCOUNTER — Encounter (HOSPITAL_COMMUNITY): Payer: Self-pay

## 2016-07-31 ENCOUNTER — Ambulatory Visit (HOSPITAL_COMMUNITY)
Admission: RE | Admit: 2016-07-31 | Discharge: 2016-07-31 | Disposition: A | Discharge status: Home / Self Care | Payer: Medicare HMO | Source: Ambulatory Visit | Attending: Family Medicine | Admitting: Family Medicine

## 2016-07-31 DIAGNOSIS — R2241 Localized swelling, mass and lump, right lower limb: Secondary | ICD-10-CM

## 2016-07-31 DIAGNOSIS — R2242 Localized swelling, mass and lump, left lower limb: Secondary | ICD-10-CM

## 2016-08-02 ENCOUNTER — Ambulatory Visit (HOSPITAL_COMMUNITY)
Admission: RE | Admit: 2016-08-02 | Discharge: 2016-08-02 | Disposition: A | Discharge status: Home / Self Care | Payer: Medicare HMO | Source: Ambulatory Visit | Attending: Family Medicine | Admitting: Family Medicine

## 2016-08-02 DIAGNOSIS — M7989 Other specified soft tissue disorders: Secondary | ICD-10-CM | POA: Diagnosis not present

## 2016-09-20 ENCOUNTER — Encounter (INDEPENDENT_AMBULATORY_CARE_PROVIDER_SITE_OTHER): Payer: Self-pay

## 2016-09-20 ENCOUNTER — Encounter (INDEPENDENT_AMBULATORY_CARE_PROVIDER_SITE_OTHER): Payer: Self-pay | Admitting: Physician Assistant

## 2016-09-20 ENCOUNTER — Ambulatory Visit (INDEPENDENT_AMBULATORY_CARE_PROVIDER_SITE_OTHER): Payer: Medicare HMO | Admitting: Physician Assistant

## 2016-09-20 VITALS — Ht 69.0 in | Wt 210.0 lb

## 2016-09-20 DIAGNOSIS — M7062 Trochanteric bursitis, left hip: Secondary | ICD-10-CM | POA: Diagnosis not present

## 2016-09-20 MED ORDER — METHYLPREDNISOLONE ACETATE 40 MG/ML IJ SUSP
40.0000 mg | INTRAMUSCULAR | Status: AC | PRN
  Administered 2016-09-20: 40 mg via INTRA_ARTICULAR

## 2016-09-20 MED ORDER — LIDOCAINE HCL 1 % IJ SOLN
3.0000 mL | INTRAMUSCULAR | Status: AC | PRN
  Administered 2016-09-20: 3 mL

## 2016-09-20 NOTE — Progress Notes (Signed)
   Procedure Note  Patient: Kiara Little P Furno             Date of Birth: 10/07/1935           MRN: [id number]             Visit Date: 09/20/2016 Chief complaint: Left hip pain  History of present illness: Mrs. Adan well known to Dr. Eliberto IvoryBlackman's practice comes in today with left hip the last 3 weeks no known injury. No real radicular symptoms down the leg. She shows more pain in left lateral hip region. She is having some low back pain and spasms though. History of left total hip arthroplasty 07/24/2013. Exam: Excellent range of motion left hip without pain. Tenderness over the lateral aspect of the left hip trochanteric region. Negative straight leg raise bilaterally tight hamstrings bilaterally.  Plan: IT band stretching. Follow up in 2 weeks' pain persist or becomes worse.  Procedures: Visit Diagnoses: Trochanteric bursitis, left hip - Plan: Large Joint Injection/Arthrocentesis  Large Joint Inj Date/Time: 09/20/2016 3:47 PM Performed by: Kirtland BouchardLARK, Vinson Tietze W Authorized by: Kirtland BouchardLARK, Bryse Blanchette W   Consent Given by:  Patient Indications:  Pain Location:  Hip Site:  L greater trochanter Needle Size:  22 G Needle Length:  1.5 inches Approach:  Lateral Ultrasound Guidance: No   Fluoroscopic Guidance: No   Arthrogram: No   Medications:  40 mg methylPREDNISolone acetate 40 MG/ML; 3 mL lidocaine 1 % Aspiration Attempted: No   Patient tolerance:  Patient tolerated the procedure well with no immediate complications

## 2016-10-09 ENCOUNTER — Ambulatory Visit (INDEPENDENT_AMBULATORY_CARE_PROVIDER_SITE_OTHER): Payer: Medicare HMO | Admitting: Physician Assistant

## 2016-10-09 DIAGNOSIS — M7632 Iliotibial band syndrome, left leg: Secondary | ICD-10-CM | POA: Diagnosis not present

## 2016-10-09 NOTE — Progress Notes (Signed)
Office Visit Note   Patient: Kiara Little           Date of Birth: 02-17-1936           MRN: [id number] Visit Date: 10/09/2016              Requested by: Renaye Rakers, MD [address] [place], [place] [address] PCP: Geraldo Pitter, MD   Assessment & Plan: Visit Diagnoses:  1. It band syndrome, left     Plan: Therapy for IT band stretching home exercise program modalities. I'll up in a month check progress lack of.  Follow-Up Instructions: Return in about 4 weeks (around 11/06/2016).   Orders:  No orders of the defined types were placed in this encounter.  No orders of the defined types were placed in this encounter.     Procedures: No procedures performed   Clinical Data: No additional findings.   Subjective: Chief Complaint  Patient presents with  . Left Hip - Follow-up    Patient states she doesn't think the injection worked well, she is still having pain on the side of her leg.    HPI [name] returns today stating that the injection helped some down her left leg particularly with the soreness . Still has pain lateral aspect of the hip with prolonged walking. She denies any radicular symptoms down the left leg or back pain. Review of Systems   Objective: Vital Signs: There were no vitals taken for this visit.  Physical Exam  Ortho Exam Tenderness down the IT band with palpation left leg. Overall good range of motion of the left hip with lateral femur pain with the extremes of external and internal rotation. Ambulates with a nonantalgic gait and no assistive device. Specialty Comments:  No specialty comments available.  Imaging: No results found.   PMFS History: Patient Active Problem List   Diagnosis Date Noted  . PAD (peripheral artery disease) (HCC) 07/26/2015  . Pain in joint, lower leg 08/02/2014  . Stiffness of joint, not elsewhere classified, pelvic region and thigh 04/30/2014  . Muscle weakness (generalized) 04/30/2014  .  Degenerative arthritis of left hip 07/17/2013  . HTN (hypertension) 02/05/2013  . Chest pain, atypical 02/05/2013  . Anxiety 02/05/2013  . Nicotine abuse 02/05/2013   Past Medical History:  Diagnosis Date  . Arthritis    OA LEFT HIP   . Chest pain   . Hypertension   . Muscle spasm   . Noncompliance   . Osteoarthritis    a. s/p R THA  . Pain    BOTH FEET "TIGHTNESS" "NO NERVE PAIN" - TAKES GABAPENTIN  . Tobacco abuse     Family History  Problem Relation Age of Onset  . Heart attack Mother     died @ 53  . Diabetes Father     died in his 51's  . Cancer Brother     deceased    Past Surgical History:  Procedure Laterality Date  . BACK SURGERY    . JOINT REPLACEMENT  2000   RIGHT TOTAL HIP ARTHROPLASTY  . SURGERY FOR TUBAL PREGNANCY    . TOTAL HIP ARTHROPLASTY Left 07/24/2013   Procedure: LEFT TOTAL HIP ARTHROPLASTY ANTERIOR APPROACH;  Surgeon: Kathryne Hitch, MD;  Location: [facility];  Service: Orthopedics;  Laterality: Left;   Social History   Occupational History  . Not on file.   Social History Main Topics  . Smoking status: Former Smoker    Packs/day: 0.50  Years: 40.00    Types: Cigarettes    Quit date: 07/24/2013  . Smokeless tobacco: Never Used  . Alcohol use No  . Drug use: No  . Sexual activity: Yes    Birth control/ protection: None

## 2016-10-11 ENCOUNTER — Telehealth (INDEPENDENT_AMBULATORY_CARE_PROVIDER_SITE_OTHER): Payer: Self-pay | Admitting: Physician Assistant

## 2016-10-11 DIAGNOSIS — M130 Polyarthritis, unspecified: Secondary | ICD-10-CM | POA: Diagnosis not present

## 2016-10-11 DIAGNOSIS — I10 Essential (primary) hypertension: Secondary | ICD-10-CM | POA: Diagnosis not present

## 2016-10-11 NOTE — Telephone Encounter (Signed)
Can we please re-write for her

## 2016-10-11 NOTE — Telephone Encounter (Signed)
Patient lost her PT order for [facility], can you please fax them a new one (she doesn't have fax #)  Cb#: [phone]

## 2016-11-06 ENCOUNTER — Ambulatory Visit (INDEPENDENT_AMBULATORY_CARE_PROVIDER_SITE_OTHER): Payer: Medicare HMO | Admitting: Orthopaedic Surgery

## 2016-11-15 DIAGNOSIS — E669 Obesity, unspecified: Secondary | ICD-10-CM | POA: Diagnosis not present

## 2016-11-15 DIAGNOSIS — I10 Essential (primary) hypertension: Secondary | ICD-10-CM | POA: Diagnosis not present

## 2016-11-15 DIAGNOSIS — M130 Polyarthritis, unspecified: Secondary | ICD-10-CM | POA: Diagnosis not present

## 2016-11-15 DIAGNOSIS — M545 Low back pain: Secondary | ICD-10-CM | POA: Diagnosis not present

## 2016-12-17 DIAGNOSIS — I10 Essential (primary) hypertension: Secondary | ICD-10-CM | POA: Diagnosis not present

## 2016-12-17 DIAGNOSIS — J441 Chronic obstructive pulmonary disease with (acute) exacerbation: Secondary | ICD-10-CM | POA: Diagnosis not present

## 2017-01-17 DIAGNOSIS — M130 Polyarthritis, unspecified: Secondary | ICD-10-CM | POA: Diagnosis not present

## 2017-01-17 DIAGNOSIS — I10 Essential (primary) hypertension: Secondary | ICD-10-CM | POA: Diagnosis not present

## 2017-01-17 DIAGNOSIS — J441 Chronic obstructive pulmonary disease with (acute) exacerbation: Secondary | ICD-10-CM | POA: Diagnosis not present

## 2017-02-20 DIAGNOSIS — J441 Chronic obstructive pulmonary disease with (acute) exacerbation: Secondary | ICD-10-CM | POA: Diagnosis not present

## 2017-02-20 DIAGNOSIS — M130 Polyarthritis, unspecified: Secondary | ICD-10-CM | POA: Diagnosis not present

## 2017-02-20 DIAGNOSIS — I10 Essential (primary) hypertension: Secondary | ICD-10-CM | POA: Diagnosis not present

## 2017-03-22 ENCOUNTER — Emergency Department (HOSPITAL_COMMUNITY)
Admission: EM | Admit: 2017-03-22 | Discharge: 2017-03-22 | Disposition: A | Discharge status: Home / Self Care | Payer: Medicare HMO | Attending: Emergency Medicine | Admitting: Emergency Medicine

## 2017-03-22 ENCOUNTER — Encounter (HOSPITAL_COMMUNITY): Payer: Self-pay

## 2017-03-22 ENCOUNTER — Emergency Department (HOSPITAL_COMMUNITY): Payer: Medicare HMO

## 2017-03-22 DIAGNOSIS — M546 Pain in thoracic spine: Secondary | ICD-10-CM | POA: Diagnosis not present

## 2017-03-22 DIAGNOSIS — M545 Low back pain: Secondary | ICD-10-CM | POA: Diagnosis not present

## 2017-03-22 DIAGNOSIS — Z87891 Personal history of nicotine dependence: Secondary | ICD-10-CM | POA: Diagnosis not present

## 2017-03-22 DIAGNOSIS — Y92019 Unspecified place in single-family (private) house as the place of occurrence of the external cause: Secondary | ICD-10-CM | POA: Diagnosis not present

## 2017-03-22 DIAGNOSIS — Y9301 Activity, walking, marching and hiking: Secondary | ICD-10-CM | POA: Diagnosis not present

## 2017-03-22 DIAGNOSIS — W01198A Fall on same level from slipping, tripping and stumbling with subsequent striking against other object, initial encounter: Secondary | ICD-10-CM | POA: Insufficient documentation

## 2017-03-22 DIAGNOSIS — Z79899 Other long term (current) drug therapy: Secondary | ICD-10-CM | POA: Insufficient documentation

## 2017-03-22 DIAGNOSIS — S3992XA Unspecified injury of lower back, initial encounter: Secondary | ICD-10-CM | POA: Diagnosis present

## 2017-03-22 DIAGNOSIS — I10 Essential (primary) hypertension: Secondary | ICD-10-CM | POA: Diagnosis not present

## 2017-03-22 DIAGNOSIS — M160 Bilateral primary osteoarthritis of hip: Secondary | ICD-10-CM | POA: Diagnosis not present

## 2017-03-22 DIAGNOSIS — Y999 Unspecified external cause status: Secondary | ICD-10-CM | POA: Diagnosis not present

## 2017-03-22 DIAGNOSIS — S300XXA Contusion of lower back and pelvis, initial encounter: Secondary | ICD-10-CM | POA: Diagnosis not present

## 2017-03-22 DIAGNOSIS — T148XXA Other injury of unspecified body region, initial encounter: Secondary | ICD-10-CM | POA: Diagnosis not present

## 2017-03-22 MED ORDER — IBUPROFEN 800 MG PO TABS
800.0000 mg | ORAL_TABLET | Freq: Once | ORAL | Status: AC
  Administered 2017-03-22: 800 mg via ORAL
  Filled 2017-03-22: qty 1

## 2017-03-22 MED ORDER — IBUPROFEN 800 MG PO TABS: 800.0000 mg | ORAL_TABLET | Freq: Three times a day (TID) | ORAL | 0 refills | Status: DC

## 2017-03-22 NOTE — Medical Student Note (Addendum)
[facility] Provider Student Note For educational purposes for Medical, PA and NP students only and not part of the legal medical record.   CSN: [id number] Arrival date & time: 03/22/17  1528     History   Chief Complaint Chief Complaint  Patient presents with  . Fall    HPI Kiara Little is a 81 y.o. female with past medical history significant for neuropathy of bilateral lower extremities, past back surgery x 30 years, and bilateral hip replacements. Patient reports that she was rushing to get something in her living room this afternoon when she tripped over a board laying in the floor. She fell backward directly on her coccyx and reports that she was not able to stretch her arms out backwards to catch herself. She reports that she did not hit her head or lose consciousness. She was able to get up after the fall and walk without difficulty. Patient denies any radiating pain and rates the pain in her coccyx an 8/10. Patient is not on blood thinners. She denies any shortness of breath, chest pain, palpitations, abdominal pain, nausea, vomiting, headache, numbness, or tingling. Patient has not urinated since the fall.   HPI  Past Medical History:  Diagnosis Date  . Arthritis    OA LEFT HIP   . Chest pain   . Hypertension   . Muscle spasm   . Noncompliance   . Osteoarthritis    a. s/p R THA  . Pain    BOTH FEET "TIGHTNESS" "NO NERVE PAIN" - TAKES GABAPENTIN  . Tobacco abuse     Patient Active Problem List   Diagnosis Date Noted  . PAD (peripheral artery disease) (HCC) 07/26/2015  . Pain in joint, lower leg 08/02/2014  . Stiffness of joint, not elsewhere classified, pelvic region and thigh 04/30/2014  . Muscle weakness (generalized) 04/30/2014  . Degenerative arthritis of left hip 07/17/2013  . HTN (hypertension) 02/05/2013  . Chest pain, atypical 02/05/2013  . Anxiety 02/05/2013  . Nicotine abuse 02/05/2013    Past Surgical History:  Procedure Laterality  Date  . BACK SURGERY    . JOINT REPLACEMENT  2000   RIGHT TOTAL HIP ARTHROPLASTY  . SURGERY FOR TUBAL PREGNANCY    . TOTAL HIP ARTHROPLASTY Left 07/24/2013   Procedure: LEFT TOTAL HIP ARTHROPLASTY ANTERIOR APPROACH;  Surgeon: Kathryne Hitch, MD;  Location: [facility];  Service: Orthopedics;  Laterality: Left;    OB History    Gravida Para Term Preterm AB Living   9         6   SAB TAB Ectopic Multiple Live Births                   Home Medications    Prior to Admission medications   Medication Sig Start Date End Date Taking? Authorizing Provider  amLODipine (NORVASC) 5 MG tablet Take 5 mg by mouth every morning.    [provider]    Family History Family History  Problem Relation Age of Onset  . Heart attack Mother        died @ 13  . Diabetes Father        died in his 39's  . Cancer Brother        deceased    Social History Social History  Substance Use Topics  . Smoking status: Former Smoker    Packs/day: 0.50    Years: 40.00    Types: Cigarettes    Quit date: 07/24/2013  .  Smokeless tobacco: Never Used  . Alcohol use No     Allergies   Patient has no known allergies.   Review of Systems Review of Systems  Respiratory: Negative for shortness of breath.   Cardiovascular: Negative for chest pain and palpitations.  Gastrointestinal: Negative for abdominal pain, nausea and vomiting.  Neurological: Negative for dizziness, light-headedness, numbness and headaches.     Physical Exam Updated Vital Signs BP (!) 139/99   Pulse 68   Temp 98.1 F (36.7 C) (Oral)   Resp 16   Ht 5\' 9"  (1.753 m)   Wt 109.8 kg   SpO2 97%   BMI 35.74 kg/m   Physical Exam  Constitutional: She is oriented to person, place, and time. She appears well-developed and well-nourished.  HENT:  Head: Normocephalic and atraumatic.  Eyes: Conjunctivae and EOM are normal.  Neck: Normal range of motion. Neck supple.  Cardiovascular: Normal rate, regular rhythm and  normal heart sounds.   Pulmonary/Chest: Effort normal.  Musculoskeletal: Normal range of motion. She exhibits tenderness.       Lumbar back: She exhibits bony tenderness and pain. She exhibits normal range of motion, no swelling and no laceration.  Neurological: She is alert and oriented to person, place, and time.  Skin: Skin is warm and dry.  Psychiatric: She has a normal mood and affect.     ED Treatments / Results  Labs (all labs ordered are listed, but only abnormal results are displayed) Labs Reviewed - No data to display  EKG  EKG Interpretation None       Radiology No results found.  Procedures Procedures (including critical care time)  Medications Ordered in ED Medications  ibuprofen (ADVIL,MOTRIN) tablet 800 mg (not administered)     Initial Impression / Assessment and Plan / ED Course  I have reviewed the triage vital signs and the nursing notes.  Pertinent labs & imaging results that were available during my care of the patient were reviewed by me and considered in my medical decision making (see chart for details).    Patient experiencing pain from falling directly on coccyx earlier this afternoon. X-ray of lumbar spine and sacrum/coccyx show no signs of injury. Patient instructed to take Ibuprofen PO 800 mg q4-6 hrs for pain and inflammation. Patient instructed to also use ice if needed for pain and to decrease her activity level for the next few days. Patient to come back to ED if she experiences a significant increase in pain, numbness/tingling, or urinary retention.    Final Clinical Impressions(s) / ED Diagnoses   Final diagnoses:  None    New Prescriptions New Prescriptions   No medications on file

## 2017-03-22 NOTE — ED Notes (Signed)
Pt out of bed to br ad lib without limp, stagger

## 2017-03-22 NOTE — Discharge Instructions (Signed)
X rays without fractures  Ibuprofen 3 times daily for 5 days as needed

## 2017-03-22 NOTE — ED Notes (Signed)
PA student in to assess 

## 2017-03-22 NOTE — ED Triage Notes (Signed)
Pt fell while stepping over a wooden door. Pt landed on back. Complaining of lower back and sacral pain. No loss of consciousness

## 2017-03-22 NOTE — ED Notes (Signed)
From radiology 

## 2017-03-22 NOTE — ED Provider Notes (Signed)
[facility]-EMERGENCY DEPT Provider Note   CSN: [id number] Arrival date & time: 03/22/17  1528     History   Chief Complaint Chief Complaint  Patient presents with  . Fall    HPI Kiara Little is a 81 y.o. female.  HPI  The patient is a 81 year old female, history of arthritis in her hip, prior history of back surgery many years ago. She states that she had a mechanical fall when she was walking in her house that she was trying to get over a obstruction, she twisted turned and accidentally fell backwards landing on her buttocks. She landed in a sitting position and did not hit her head, has no numbness or weakness of the 4 extremities and denies any other pathologic factors regarding her spine. She was ambulatory upon the paramedics arrival, she came by EMS but was ambulatory prior to their arrival. No medications given prehospital. The pain is gone when she is lying down, worse with changing position and palpation over the back.  Past Medical History:  Diagnosis Date  . Arthritis    OA LEFT HIP   . Chest pain   . Hypertension   . Muscle spasm   . Noncompliance   . Osteoarthritis    a. s/p R THA  . Pain    BOTH FEET "TIGHTNESS" "NO NERVE PAIN" - TAKES GABAPENTIN  . Tobacco abuse     Patient Active Problem List   Diagnosis Date Noted  . PAD (peripheral artery disease) (HCC) 07/26/2015  . Pain in joint, lower leg 08/02/2014  . Stiffness of joint, not elsewhere classified, pelvic region and thigh 04/30/2014  . Muscle weakness (generalized) 04/30/2014  . Degenerative arthritis of left hip 07/17/2013  . HTN (hypertension) 02/05/2013  . Chest pain, atypical 02/05/2013  . Anxiety 02/05/2013  . Nicotine abuse 02/05/2013    Past Surgical History:  Procedure Laterality Date  . BACK SURGERY    . JOINT REPLACEMENT  2000   RIGHT TOTAL HIP ARTHROPLASTY  . SURGERY FOR TUBAL PREGNANCY    . TOTAL HIP ARTHROPLASTY Left 07/24/2013   Procedure: LEFT TOTAL HIP ARTHROPLASTY ANTERIOR  APPROACH;  Surgeon: Kathryne Hitchhristopher Y Blackman, MD;  Location: [facility];  Service: Orthopedics;  Laterality: Left;    OB History    Gravida Para Term Preterm AB Living   9         6   SAB TAB Ectopic Multiple Live Births                   Home Medications    Prior to Admission medications   Medication Sig Start Date End Date Taking? Authorizing Provider  amLODipine (NORVASC) 5 MG tablet Take 5 mg by mouth every morning.    [provider]  ibuprofen (ADVIL,MOTRIN) 800 MG tablet Take 1 tablet (800 mg total) by mouth 3 (three) times daily. 03/22/17   Eber HongMiller, Naveen Lorusso, MD    Family History Family History  Problem Relation Age of Onset  . Heart attack Mother        died @ 4274  . Diabetes Father        died in his 3060's  . Cancer Brother        deceased    Social History Social History  Substance Use Topics  . Smoking status: Former Smoker    Packs/day: 0.50    Years: 40.00    Types: Cigarettes    Quit date: 07/24/2013  . Smokeless tobacco: Never Used  . Alcohol use  No     Allergies   Patient has no known allergies.   Review of Systems Review of Systems  Constitutional: Negative for fever.  Musculoskeletal: Positive for back pain. Negative for neck pain.  Neurological: Negative for weakness and numbness.     Physical Exam Updated Vital Signs BP (!) 145/83   Pulse (!) 104   Temp 98.1 F (36.7 C) (Oral)   Resp 16   Ht 5\' 9"  (1.753 m)   Wt 109.8 kg (242 lb)   SpO2 98%   BMI 35.74 kg/m   Physical Exam  Constitutional: She appears well-developed and well-nourished.  HENT:  Head: Normocephalic and atraumatic.  Eyes: Conjunctivae are normal. Right eye exhibits no discharge. Left eye exhibits no discharge.  Pulmonary/Chest: Effort normal. No respiratory distress.  Musculoskeletal:  Full range motion of the bilateral hips to passive range of motion. She is able to straight leg raise bilaterally though she has some muscular pain with raising the right leg. She  has tenderness over the lumbar and sacral spine. There is no tenderness over the thoracic spine.  Neurological: She is alert. Coordination normal.  The patient is able to lift bilateral lower extremities with normal strength, she has bilateral equal sensation of the lower extremities, she is able to follow commands without difficulty.  Skin: Skin is warm and dry. No rash noted. She is not diaphoretic. No erythema.  Psychiatric: She has a normal mood and affect.  Nursing note and vitals reviewed.    ED Treatments / Results  Labs (all labs ordered are listed, but only abnormal results are displayed) Labs Reviewed - No data to display   Radiology Dg Lumbar Spine Complete  Result Date: 03/22/2017 CLINICAL DATA:  Low back pain and sacral pain radiating to the right leg. EXAM: LUMBAR SPINE - COMPLETE 4+ VIEW COMPARISON:  07/20/2011 FINDINGS: Transitional lumbosacral vertebral anatomy with S1-S2 disc. Grade 1 anterolisthesis of L4 on L5 is again noted with interval progression of degenerative disc space narrowing at L4-5. Chronic moderate to marked disc space narrowing at L5-S1. No acute compression fracture or frank bone destruction is seen. There is chronic moderate to marked facet arthropathy from L1 through sacrum. Status post bilateral hip arthroplasties. IMPRESSION: 1. Progression of disc space narrowing at L4-5 with chronic grade 1 spondylolisthesis of L4 on L5. 2. Multilevel degenerative facet arthropathy of the lumbosacral spine. 3. No acute fracture nor bone destruction. Electronically Signed   By: Tollie Eth M.D.   On: 03/22/2017 17:45   Dg Sacrum/coccyx  Result Date: 03/22/2017 CLINICAL DATA:  Sacral pain radiating to right leg. EXAM: SACRUM AND COCCYX - 2+ VIEW COMPARISON:  None. FINDINGS: Sclerosis about both SI joints consistent osteoarthritis. Intact arcuate lines of sacrum. There is transitional lumbosacral vertebral anatomy with chronic grade 1 anterolisthesis L4 on L5. Laminectomy  defect suggested at L5 and S1. Multilevel degenerative facet arthropathy is seen. Partially imaged hip arthroplasty is appear intact. The pubic symphysis appears maintained. No pubic rami fractures. IMPRESSION: No acute sacral fracture or bone destruction is identified. There is osteoarthritic sclerosis about both SI joints. Electronically Signed   By: Tollie Eth M.D.   On: 03/22/2017 17:48    Procedures Procedures (including critical care time)  Medications Ordered in ED Medications  ibuprofen (ADVIL,MOTRIN) tablet 800 mg (800 mg Oral Given 03/22/17 1648)     Initial Impression / Assessment and Plan / ED Course  I have reviewed the triage vital signs and the nursing notes.  Pertinent labs &  imaging results that were available during my care of the patient were reviewed by me and considered in my medical decision making (see chart for details).     X-ray lumbar spine, sacrum, evaluate for fracture. Patient is in agreement. Ibuprofen ordered. Otherwise no signs of red flags that would require MRI at this time.  X-rays negative for fracture, patient stable for discharge on ibuprofen, informed of her results, agreeable to the plan.  Final Clinical Impressions(s) / ED Diagnoses   Final diagnoses:  Contusion of lower back, initial encounter    New Prescriptions New Prescriptions   IBUPROFEN (ADVIL,MOTRIN) 800 MG TABLET    Take 1 tablet (800 mg total) by mouth 3 (three) times daily.     Eber Hong, MD 03/22/17 [phone]

## 2017-03-22 NOTE — ED Notes (Signed)
PA student at bedside.

## 2017-04-11 ENCOUNTER — Emergency Department (HOSPITAL_COMMUNITY): Payer: Medicare HMO

## 2017-04-11 ENCOUNTER — Emergency Department (HOSPITAL_COMMUNITY)
Admission: EM | Admit: 2017-04-11 | Discharge: 2017-04-11 | Disposition: A | Discharge status: Home / Self Care | Payer: Medicare HMO | Attending: Emergency Medicine | Admitting: Emergency Medicine

## 2017-04-11 DIAGNOSIS — Y999 Unspecified external cause status: Secondary | ICD-10-CM | POA: Diagnosis not present

## 2017-04-11 DIAGNOSIS — Y9389 Activity, other specified: Secondary | ICD-10-CM | POA: Diagnosis not present

## 2017-04-11 DIAGNOSIS — S199XXA Unspecified injury of neck, initial encounter: Secondary | ICD-10-CM | POA: Diagnosis not present

## 2017-04-11 DIAGNOSIS — Z87891 Personal history of nicotine dependence: Secondary | ICD-10-CM | POA: Insufficient documentation

## 2017-04-11 DIAGNOSIS — I10 Essential (primary) hypertension: Secondary | ICD-10-CM | POA: Insufficient documentation

## 2017-04-11 DIAGNOSIS — M546 Pain in thoracic spine: Secondary | ICD-10-CM | POA: Diagnosis not present

## 2017-04-11 DIAGNOSIS — M542 Cervicalgia: Secondary | ICD-10-CM | POA: Diagnosis not present

## 2017-04-11 DIAGNOSIS — S161XXA Strain of muscle, fascia and tendon at neck level, initial encounter: Secondary | ICD-10-CM | POA: Diagnosis not present

## 2017-04-11 DIAGNOSIS — R079 Chest pain, unspecified: Secondary | ICD-10-CM | POA: Diagnosis not present

## 2017-04-11 DIAGNOSIS — T148XXA Other injury of unspecified body region, initial encounter: Secondary | ICD-10-CM | POA: Diagnosis not present

## 2017-04-11 DIAGNOSIS — Y92410 Unspecified street and highway as the place of occurrence of the external cause: Secondary | ICD-10-CM | POA: Insufficient documentation

## 2017-04-11 DIAGNOSIS — R0782 Intercostal pain: Secondary | ICD-10-CM

## 2017-04-11 DIAGNOSIS — S1980XA Other specified injuries of unspecified part of neck, initial encounter: Secondary | ICD-10-CM | POA: Diagnosis present

## 2017-04-11 DIAGNOSIS — M25561 Pain in right knee: Secondary | ICD-10-CM | POA: Diagnosis not present

## 2017-04-11 DIAGNOSIS — R061 Stridor: Secondary | ICD-10-CM | POA: Diagnosis not present

## 2017-04-11 DIAGNOSIS — S0990XA Unspecified injury of head, initial encounter: Secondary | ICD-10-CM | POA: Diagnosis not present

## 2017-04-11 LAB — BASIC METABOLIC PANEL
Anion gap: 10 (ref 5–15)
BUN: 13 mg/dL (ref 6–20)
CALCIUM: 8.8 mg/dL — AB (ref 8.9–10.3)
CO2: 26 mmol/L (ref 22–32)
CREATININE: 0.86 mg/dL (ref 0.44–1.00)
Chloride: 104 mmol/L (ref 101–111)
GFR calc non Af Amer: >60 mL/min (ref >60)
Glucose, Bld: 86 mg/dL (ref 65–99)
Potassium: 3.6 mmol/L (ref 3.5–5.1)
SODIUM: 140 mmol/L (ref 135–145)

## 2017-04-11 LAB — I-STAT TROPONIN, ED
TROPONIN I, POC: 0 ng/mL (ref 0.00–0.08)
Troponin i, poc: 0 ng/mL (ref 0.00–0.08)

## 2017-04-11 LAB — CBC
HCT: 39 % (ref 36.0–46.0)
Hemoglobin: 12.5 g/dL (ref 12.0–15.0)
MCH: 29.1 pg (ref 26.0–34.0)
MCHC: 32.1 g/dL (ref 30.0–36.0)
MCV: 90.9 fL (ref 78.0–100.0)
PLATELETS: 259 10*3/uL (ref 150–400)
RBC: 4.29 MIL/uL (ref 3.87–5.11)
RDW: 15.6 % — ABNORMAL HIGH (ref 11.5–15.5)
WBC: 7.6 10*3/uL (ref 4.0–10.5)

## 2017-04-11 MED ORDER — IBUPROFEN 400 MG PO TABS
400.0000 mg | ORAL_TABLET | Freq: Once | ORAL | Status: DC
  Filled 2017-04-11: qty 1

## 2017-04-11 MED ORDER — DIAZEPAM 5 MG PO TABS: 2.5000 mg | ORAL_TABLET | Freq: Three times a day (TID) | ORAL | 0 refills | Status: DC | PRN

## 2017-04-11 MED ORDER — DIAZEPAM 2 MG PO TABS
2.0000 mg | ORAL_TABLET | Freq: Once | ORAL | Status: AC
  Administered 2017-04-11: 2 mg via ORAL
  Filled 2017-04-11: qty 1

## 2017-04-11 MED ORDER — ACETAMINOPHEN 325 MG PO TABS
650.0000 mg | ORAL_TABLET | Freq: Once | ORAL | Status: AC
  Administered 2017-04-11: 650 mg via ORAL
  Filled 2017-04-11: qty 2
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
## 2017-04-11 NOTE — ED Notes (Signed)
Pt returned from x ray.  No complaints at present.  Family at bedside.
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
## 2017-04-11 NOTE — ED Notes (Signed)
Pt transported to CT and xray via stretcher.

## 2017-04-11 NOTE — ED Notes (Signed)
Pt verbalized understanding of dc instructions and had no further questions.  Pt removed all belongings from the room.

## 2017-04-11 NOTE — ED Notes (Signed)
Pt states she is has an allergy to Ibuprofen.  Dahlia ClientHannah, [place]PA notified and allergy listed on chart.

## 2017-04-11 NOTE — ED Notes (Signed)
Pt requesting pain medication at this time.  Provider to go see pt.
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
## 2017-04-11 NOTE — Discharge Instructions (Signed)
1. Medications: Valium as a muscle relaxer, usual home medications 2. Treatment: rest, drink plenty of fluids, tylenol or ibuprofen for pain control 3. Follow Up: Please followup with your primary doctor in 2-4 days for discussion of your diagnoses and further evaluation after today's visit; if you do not have a primary care doctor use the resource guide provided to find one; Please return to the ER for worsening chest pain, difficult breathing, weakness, numbness, loss of bowel or bladder control or other concerning symptoms

## 2017-04-16 DIAGNOSIS — M130 Polyarthritis, unspecified: Secondary | ICD-10-CM | POA: Diagnosis not present

## 2017-04-16 DIAGNOSIS — I10 Essential (primary) hypertension: Secondary | ICD-10-CM | POA: Diagnosis not present

## 2017-04-16 DIAGNOSIS — S40029A Contusion of unspecified upper arm, initial encounter: Secondary | ICD-10-CM | POA: Diagnosis not present

## 2017-04-19 DIAGNOSIS — H25813 Combined forms of age-related cataract, bilateral: Secondary | ICD-10-CM | POA: Diagnosis not present

## 2017-04-19 DIAGNOSIS — H40013 Open angle with borderline findings, low risk, bilateral: Secondary | ICD-10-CM | POA: Diagnosis not present

## 2017-04-19 DIAGNOSIS — H04123 Dry eye syndrome of bilateral lacrimal glands: Secondary | ICD-10-CM | POA: Diagnosis not present

## 2017-04-26 DIAGNOSIS — Z0100 Encounter for examination of eyes and vision without abnormal findings: Secondary | ICD-10-CM | POA: Diagnosis not present

## 2017-04-30 DIAGNOSIS — M546 Pain in thoracic spine: Secondary | ICD-10-CM | POA: Diagnosis not present

## 2017-04-30 DIAGNOSIS — M542 Cervicalgia: Secondary | ICD-10-CM | POA: Diagnosis not present

## 2017-05-09 DIAGNOSIS — M542 Cervicalgia: Secondary | ICD-10-CM | POA: Diagnosis not present

## 2017-05-09 DIAGNOSIS — M546 Pain in thoracic spine: Secondary | ICD-10-CM | POA: Diagnosis not present

## 2017-05-10 DIAGNOSIS — E785 Hyperlipidemia, unspecified: Secondary | ICD-10-CM | POA: Diagnosis not present

## 2017-05-10 DIAGNOSIS — S5010XS Contusion of unspecified forearm, sequela: Secondary | ICD-10-CM | POA: Diagnosis not present

## 2017-05-10 DIAGNOSIS — E876 Hypokalemia: Secondary | ICD-10-CM | POA: Diagnosis not present

## 2017-05-10 DIAGNOSIS — E559 Vitamin D deficiency, unspecified: Secondary | ICD-10-CM | POA: Diagnosis not present

## 2017-05-16 DIAGNOSIS — M546 Pain in thoracic spine: Secondary | ICD-10-CM | POA: Diagnosis not present

## 2017-05-16 DIAGNOSIS — M542 Cervicalgia: Secondary | ICD-10-CM | POA: Diagnosis not present

## 2017-05-23 DIAGNOSIS — M542 Cervicalgia: Secondary | ICD-10-CM | POA: Diagnosis not present

## 2017-05-23 DIAGNOSIS — M546 Pain in thoracic spine: Secondary | ICD-10-CM | POA: Diagnosis not present

## 2017-06-10 DIAGNOSIS — M542 Cervicalgia: Secondary | ICD-10-CM | POA: Diagnosis not present

## 2017-06-10 DIAGNOSIS — M546 Pain in thoracic spine: Secondary | ICD-10-CM | POA: Diagnosis not present

## 2017-06-11 DIAGNOSIS — I10 Essential (primary) hypertension: Secondary | ICD-10-CM | POA: Diagnosis not present

## 2017-06-11 DIAGNOSIS — J441 Chronic obstructive pulmonary disease with (acute) exacerbation: Secondary | ICD-10-CM | POA: Diagnosis not present

## 2017-06-11 DIAGNOSIS — M130 Polyarthritis, unspecified: Secondary | ICD-10-CM | POA: Diagnosis not present

## 2017-06-17 DIAGNOSIS — M542 Cervicalgia: Secondary | ICD-10-CM | POA: Diagnosis not present

## 2017-06-17 DIAGNOSIS — M546 Pain in thoracic spine: Secondary | ICD-10-CM | POA: Diagnosis not present

## 2017-06-21 ENCOUNTER — Emergency Department (HOSPITAL_COMMUNITY)
Admission: EM | Admit: 2017-06-21 | Discharge: 2017-06-21 | Disposition: A | Discharge status: Home / Self Care | Payer: Medicare Other | Attending: Emergency Medicine | Admitting: Emergency Medicine

## 2017-06-21 ENCOUNTER — Encounter (HOSPITAL_COMMUNITY): Payer: Self-pay | Admitting: Emergency Medicine

## 2017-06-21 ENCOUNTER — Emergency Department (HOSPITAL_COMMUNITY): Payer: Medicare Other

## 2017-06-21 DIAGNOSIS — S199XXA Unspecified injury of neck, initial encounter: Secondary | ICD-10-CM | POA: Diagnosis not present

## 2017-06-21 DIAGNOSIS — Z79899 Other long term (current) drug therapy: Secondary | ICD-10-CM | POA: Diagnosis not present

## 2017-06-21 DIAGNOSIS — G4485 Primary stabbing headache: Secondary | ICD-10-CM | POA: Diagnosis not present

## 2017-06-21 DIAGNOSIS — I10 Essential (primary) hypertension: Secondary | ICD-10-CM | POA: Diagnosis not present

## 2017-06-21 DIAGNOSIS — R51 Headache: Secondary | ICD-10-CM | POA: Diagnosis not present

## 2017-06-21 DIAGNOSIS — Z87891 Personal history of nicotine dependence: Secondary | ICD-10-CM | POA: Insufficient documentation

## 2017-06-21 DIAGNOSIS — S0990XA Unspecified injury of head, initial encounter: Secondary | ICD-10-CM | POA: Diagnosis not present

## 2017-06-21 DIAGNOSIS — M542 Cervicalgia: Secondary | ICD-10-CM | POA: Diagnosis not present

## 2017-06-21 MED ORDER — METOCLOPRAMIDE HCL 5 MG/ML IJ SOLN
5.0000 mg | Freq: Once | INTRAMUSCULAR | Status: AC
  Administered 2017-06-21: 5 mg via INTRAVENOUS
  Filled 2017-06-21: qty 2

## 2017-06-21 MED ORDER — PREDNISONE 20 MG PO TABS: ORAL_TABLET | ORAL | 0 refills | Status: DC

## 2017-06-21 MED ORDER — METHOCARBAMOL 500 MG PO TABS: ORAL_TABLET | ORAL | 0 refills | Status: DC

## 2017-06-21 MED ORDER — DEXAMETHASONE SODIUM PHOSPHATE 10 MG/ML IJ SOLN
10.0000 mg | Freq: Once | INTRAMUSCULAR | Status: AC
  Administered 2017-06-21: 10 mg via INTRAVENOUS
  Filled 2017-06-21: qty 1

## 2017-06-21 MED ORDER — DIPHENHYDRAMINE HCL 50 MG/ML IJ SOLN
12.5000 mg | Freq: Once | INTRAMUSCULAR | Status: AC
  Administered 2017-06-21: 12.5 mg via INTRAVENOUS
  Filled 2017-06-21: qty 1

## 2017-06-21 MED ORDER — SODIUM CHLORIDE 0.9 % IV BOLUS (SEPSIS)
500.0000 mL | Freq: Once | INTRAVENOUS | Status: AC
  Administered 2017-06-21: 500 mL via INTRAVENOUS
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
## 2017-06-21 NOTE — ED Triage Notes (Addendum)
Pt states she has a headache that's going on since Wednesday. Also complains of neck pain.

## 2017-06-21 NOTE — Discharge Instructions (Signed)
Continue using ice and heat on your neck for comfort. Take the medications as prescribed. Return to the ED for any problems listed on the head injury sheet. Let Dr Parke Simmers now if you aren't improving.

## 2017-06-24 DIAGNOSIS — M62838 Other muscle spasm: Secondary | ICD-10-CM | POA: Diagnosis not present

## 2017-06-25 DIAGNOSIS — M542 Cervicalgia: Secondary | ICD-10-CM | POA: Diagnosis not present

## 2017-06-25 DIAGNOSIS — M546 Pain in thoracic spine: Secondary | ICD-10-CM | POA: Diagnosis not present

## 2017-07-02 DIAGNOSIS — M542 Cervicalgia: Secondary | ICD-10-CM | POA: Diagnosis not present

## 2017-07-02 DIAGNOSIS — M546 Pain in thoracic spine: Secondary | ICD-10-CM | POA: Diagnosis not present

## 2017-07-08 DIAGNOSIS — M542 Cervicalgia: Secondary | ICD-10-CM | POA: Diagnosis not present

## 2017-07-08 DIAGNOSIS — M546 Pain in thoracic spine: Secondary | ICD-10-CM | POA: Diagnosis not present

## 2017-07-12 DIAGNOSIS — I10 Essential (primary) hypertension: Secondary | ICD-10-CM | POA: Diagnosis not present

## 2017-07-12 DIAGNOSIS — J441 Chronic obstructive pulmonary disease with (acute) exacerbation: Secondary | ICD-10-CM | POA: Diagnosis not present

## 2017-07-12 DIAGNOSIS — M62838 Other muscle spasm: Secondary | ICD-10-CM | POA: Diagnosis not present

## 2017-07-15 DIAGNOSIS — M546 Pain in thoracic spine: Secondary | ICD-10-CM | POA: Diagnosis not present

## 2017-07-15 DIAGNOSIS — M542 Cervicalgia: Secondary | ICD-10-CM | POA: Diagnosis not present

## 2017-07-26 DIAGNOSIS — M546 Pain in thoracic spine: Secondary | ICD-10-CM | POA: Diagnosis not present

## 2017-07-26 DIAGNOSIS — M542 Cervicalgia: Secondary | ICD-10-CM | POA: Diagnosis not present

## 2017-07-29 DIAGNOSIS — M546 Pain in thoracic spine: Secondary | ICD-10-CM | POA: Diagnosis not present

## 2017-07-29 DIAGNOSIS — M542 Cervicalgia: Secondary | ICD-10-CM | POA: Diagnosis not present

## 2017-08-05 DIAGNOSIS — M546 Pain in thoracic spine: Secondary | ICD-10-CM | POA: Diagnosis not present

## 2017-08-05 DIAGNOSIS — M542 Cervicalgia: Secondary | ICD-10-CM | POA: Diagnosis not present

## 2017-08-12 DIAGNOSIS — M546 Pain in thoracic spine: Secondary | ICD-10-CM | POA: Diagnosis not present

## 2017-08-12 DIAGNOSIS — M542 Cervicalgia: Secondary | ICD-10-CM | POA: Diagnosis not present

## 2017-08-20 DIAGNOSIS — J029 Acute pharyngitis, unspecified: Secondary | ICD-10-CM | POA: Diagnosis not present

## 2017-09-20 DIAGNOSIS — L03031 Cellulitis of right toe: Secondary | ICD-10-CM | POA: Diagnosis not present

## 2017-10-02 DIAGNOSIS — M25511 Pain in right shoulder: Secondary | ICD-10-CM | POA: Diagnosis not present

## 2017-10-02 DIAGNOSIS — R2689 Other abnormalities of gait and mobility: Secondary | ICD-10-CM | POA: Diagnosis not present

## 2017-10-02 DIAGNOSIS — M25512 Pain in left shoulder: Secondary | ICD-10-CM | POA: Diagnosis not present

## 2017-10-02 DIAGNOSIS — M542 Cervicalgia: Secondary | ICD-10-CM | POA: Diagnosis not present

## 2017-10-02 DIAGNOSIS — M545 Low back pain: Secondary | ICD-10-CM | POA: Diagnosis not present

## 2017-10-04 DIAGNOSIS — M7989 Other specified soft tissue disorders: Secondary | ICD-10-CM | POA: Diagnosis not present

## 2017-10-04 DIAGNOSIS — B351 Tinea unguium: Secondary | ICD-10-CM | POA: Diagnosis not present

## 2017-10-08 ENCOUNTER — Other Ambulatory Visit: Payer: Self-pay

## 2017-10-08 DIAGNOSIS — I739 Peripheral vascular disease, unspecified: Secondary | ICD-10-CM

## 2017-10-08 DIAGNOSIS — R0989 Other specified symptoms and signs involving the circulatory and respiratory systems: Secondary | ICD-10-CM

## 2017-10-15 DIAGNOSIS — M542 Cervicalgia: Secondary | ICD-10-CM | POA: Diagnosis not present

## 2017-10-15 DIAGNOSIS — R2689 Other abnormalities of gait and mobility: Secondary | ICD-10-CM | POA: Diagnosis not present

## 2017-10-15 DIAGNOSIS — M545 Low back pain: Secondary | ICD-10-CM | POA: Diagnosis not present

## 2017-10-15 DIAGNOSIS — M25512 Pain in left shoulder: Secondary | ICD-10-CM | POA: Diagnosis not present

## 2017-10-15 DIAGNOSIS — M25511 Pain in right shoulder: Secondary | ICD-10-CM | POA: Diagnosis not present

## 2017-10-17 DIAGNOSIS — M542 Cervicalgia: Secondary | ICD-10-CM | POA: Diagnosis not present

## 2017-10-17 DIAGNOSIS — M25512 Pain in left shoulder: Secondary | ICD-10-CM | POA: Diagnosis not present

## 2017-10-17 DIAGNOSIS — M25511 Pain in right shoulder: Secondary | ICD-10-CM | POA: Diagnosis not present

## 2017-10-17 DIAGNOSIS — M545 Low back pain: Secondary | ICD-10-CM | POA: Diagnosis not present

## 2017-10-17 DIAGNOSIS — R2689 Other abnormalities of gait and mobility: Secondary | ICD-10-CM | POA: Diagnosis not present

## 2017-10-22 DIAGNOSIS — M25512 Pain in left shoulder: Secondary | ICD-10-CM | POA: Diagnosis not present

## 2017-10-22 DIAGNOSIS — R2689 Other abnormalities of gait and mobility: Secondary | ICD-10-CM | POA: Diagnosis not present

## 2017-10-22 DIAGNOSIS — M545 Low back pain: Secondary | ICD-10-CM | POA: Diagnosis not present

## 2017-10-22 DIAGNOSIS — M25511 Pain in right shoulder: Secondary | ICD-10-CM | POA: Diagnosis not present

## 2017-10-22 DIAGNOSIS — M542 Cervicalgia: Secondary | ICD-10-CM | POA: Diagnosis not present

## 2017-10-24 DIAGNOSIS — M25512 Pain in left shoulder: Secondary | ICD-10-CM | POA: Diagnosis not present

## 2017-10-24 DIAGNOSIS — R2689 Other abnormalities of gait and mobility: Secondary | ICD-10-CM | POA: Diagnosis not present

## 2017-10-24 DIAGNOSIS — M25511 Pain in right shoulder: Secondary | ICD-10-CM | POA: Diagnosis not present

## 2017-10-24 DIAGNOSIS — M542 Cervicalgia: Secondary | ICD-10-CM | POA: Diagnosis not present

## 2017-10-24 DIAGNOSIS — M545 Low back pain: Secondary | ICD-10-CM | POA: Diagnosis not present

## 2017-10-29 DIAGNOSIS — M545 Low back pain: Secondary | ICD-10-CM | POA: Diagnosis not present

## 2017-10-29 DIAGNOSIS — M542 Cervicalgia: Secondary | ICD-10-CM | POA: Diagnosis not present

## 2017-10-29 DIAGNOSIS — R2689 Other abnormalities of gait and mobility: Secondary | ICD-10-CM | POA: Diagnosis not present

## 2017-10-29 DIAGNOSIS — M25512 Pain in left shoulder: Secondary | ICD-10-CM | POA: Diagnosis not present

## 2017-10-29 DIAGNOSIS — M25511 Pain in right shoulder: Secondary | ICD-10-CM | POA: Diagnosis not present

## 2017-10-31 DIAGNOSIS — M25512 Pain in left shoulder: Secondary | ICD-10-CM | POA: Diagnosis not present

## 2017-10-31 DIAGNOSIS — M542 Cervicalgia: Secondary | ICD-10-CM | POA: Diagnosis not present

## 2017-10-31 DIAGNOSIS — R2689 Other abnormalities of gait and mobility: Secondary | ICD-10-CM | POA: Diagnosis not present

## 2017-10-31 DIAGNOSIS — M545 Low back pain: Secondary | ICD-10-CM | POA: Diagnosis not present

## 2017-10-31 DIAGNOSIS — M25511 Pain in right shoulder: Secondary | ICD-10-CM | POA: Diagnosis not present

## 2017-11-01 DIAGNOSIS — M7989 Other specified soft tissue disorders: Secondary | ICD-10-CM | POA: Diagnosis not present

## 2017-11-01 DIAGNOSIS — B351 Tinea unguium: Secondary | ICD-10-CM | POA: Diagnosis not present

## 2017-11-05 DIAGNOSIS — M25511 Pain in right shoulder: Secondary | ICD-10-CM | POA: Diagnosis not present

## 2017-11-05 DIAGNOSIS — M542 Cervicalgia: Secondary | ICD-10-CM | POA: Diagnosis not present

## 2017-11-05 DIAGNOSIS — M25512 Pain in left shoulder: Secondary | ICD-10-CM | POA: Diagnosis not present

## 2017-11-05 DIAGNOSIS — M545 Low back pain: Secondary | ICD-10-CM | POA: Diagnosis not present

## 2017-11-05 DIAGNOSIS — R2689 Other abnormalities of gait and mobility: Secondary | ICD-10-CM | POA: Diagnosis not present

## 2017-11-12 DIAGNOSIS — M25511 Pain in right shoulder: Secondary | ICD-10-CM | POA: Diagnosis not present

## 2017-11-12 DIAGNOSIS — M25512 Pain in left shoulder: Secondary | ICD-10-CM | POA: Diagnosis not present

## 2017-11-12 DIAGNOSIS — M542 Cervicalgia: Secondary | ICD-10-CM | POA: Diagnosis not present

## 2017-11-12 DIAGNOSIS — R2689 Other abnormalities of gait and mobility: Secondary | ICD-10-CM | POA: Diagnosis not present

## 2017-11-12 DIAGNOSIS — M545 Low back pain: Secondary | ICD-10-CM | POA: Diagnosis not present

## 2017-11-13 ENCOUNTER — Encounter: Payer: Self-pay | Admitting: Vascular Surgery

## 2017-11-13 ENCOUNTER — Ambulatory Visit (HOSPITAL_COMMUNITY)
Admission: RE | Admit: 2017-11-13 | Discharge: 2017-11-13 | Disposition: A | Discharge status: Home / Self Care | Payer: Medicare Other | Source: Ambulatory Visit | Attending: Vascular Surgery | Admitting: Vascular Surgery

## 2017-11-13 ENCOUNTER — Ambulatory Visit (INDEPENDENT_AMBULATORY_CARE_PROVIDER_SITE_OTHER): Payer: Medicare Other | Admitting: Vascular Surgery

## 2017-11-13 DIAGNOSIS — R9439 Abnormal result of other cardiovascular function study: Secondary | ICD-10-CM | POA: Diagnosis not present

## 2017-11-13 DIAGNOSIS — I70219 Atherosclerosis of native arteries of extremities with intermittent claudication, unspecified extremity: Secondary | ICD-10-CM | POA: Insufficient documentation

## 2017-11-13 DIAGNOSIS — I739 Peripheral vascular disease, unspecified: Secondary | ICD-10-CM | POA: Insufficient documentation

## 2017-11-13 DIAGNOSIS — I70213 Atherosclerosis of native arteries of extremities with intermittent claudication, bilateral legs: Secondary | ICD-10-CM

## 2017-11-13 DIAGNOSIS — R0989 Other specified symptoms and signs involving the circulatory and respiratory systems: Secondary | ICD-10-CM

## 2017-11-13 MED ORDER — ATORVASTATIN CALCIUM 10 MG PO TABS: 10.0000 mg | ORAL_TABLET | Freq: Every day | ORAL | 2 refills | Status: DC

## 2017-11-13 NOTE — Progress Notes (Signed)
Requested by:  Renaye RakersBland, Veita, MD [address] [place] [id number]  Reason for consultation: right great toe drainage   History of Present Illness   Kiara Little is a 82 y.o. (11/09/1935) female who presents with chief complaint: drainage from right great toes. Patient had been getting toe nail procedures for months when pt developed right great toe drainage.  Reported this has completely resolved after partial removal of the nail.  Additionally patient notes "tightness" in both calves at rest and with walking.  Patient does not describe classic intermittent claudication.  The patient has no rest pain symptoms also and no further wounds or ulcers in her legs.  Atherosclerotic risk factors include: HTN, HLD, prior smoking.  Past Medical History:  Diagnosis Date  . Arthritis    OA LEFT HIP   . Chest pain   . Hypertension   . Muscle spasm   . Noncompliance   . Osteoarthritis    a. s/p R THA  . Pain    BOTH FEET "TIGHTNESS" "NO NERVE PAIN" - TAKES GABAPENTIN  . Tobacco abuse     Past Surgical History:  Procedure Laterality Date  . BACK SURGERY    . JOINT REPLACEMENT  2000   RIGHT TOTAL HIP ARTHROPLASTY  . SURGERY FOR TUBAL PREGNANCY    . TOTAL HIP ARTHROPLASTY Left 07/24/2013   Procedure: LEFT TOTAL HIP ARTHROPLASTY ANTERIOR APPROACH;  Surgeon: Kathryne Hitchhristopher Y Blackman, MD;  Location: [facility];  Service: Orthopedics;  Laterality: Left;    Social History   Socioeconomic History  . Marital status: Legally Separated    Spouse name: Not on file  . Number of children: Not on file  . Years of education: Not on file  . Highest education level: Not on file  Social Needs  . Financial resource strain: Not on file  . Food insecurity - worry: Not on file  . Food insecurity - inability: Not on file  . Transportation needs - medical: Not on file  . Transportation needs - non-medical: Not on file  Occupational History  . Not on file  Tobacco Use  . Smoking status: Former  Smoker    Packs/day: 0.50    Years: 40.00    Pack years: 20.00    Types: Cigarettes    Last attempt to quit: 07/24/2013    Years since quitting: 4.3  . Smokeless tobacco: Never Used  Substance and Sexual Activity  . Alcohol use: No  . Drug use: No  . Sexual activity: Yes    Birth control/protection: None  Other Topics Concern  . Not on file  Social History Narrative   [place].  She is retired.  She does not exercise.    Family History  Problem Relation Age of Onset  . Heart attack Mother        died @ 2974  . Diabetes Father        died in his 3260's  . Cancer Brother        deceased    Current Outpatient Medications  Medication Sig Dispense Refill  . amLODipine (NORVASC) 5 MG tablet Take 5 mg by mouth every morning.    Marland Kitchen. atorvastatin (LIPITOR) 10 MG tablet Take 1 tablet (10 mg total) by mouth daily. 30 tablet 2  . diazepam (VALIUM) 5 MG tablet Take 0.5 tablets (2.5 mg total) by mouth every 8 (eight) hours as needed for muscle spasms (spasms). (Patient not taking: Reported  on 11/13/2017) 7 tablet 0  . ibuprofen (ADVIL,MOTRIN) 800 MG tablet Take 1 tablet (800 mg total) by mouth 3 (three) times daily. (Patient not taking: Reported on 11/13/2017) 21 tablet 0  . methocarbamol (ROBAXIN) 500 MG tablet Take 1 or 2 po Q 6hrs for pain (Patient not taking: Reported on 11/13/2017) 60 tablet 0  . predniSONE (DELTASONE) 20 MG tablet Take 2 po QD x 4d then 1 po QD x 4d (Patient not taking: Reported on 11/13/2017) 12 tablet 0   No current facility-administered medications for this visit.     Allergies  Allergen Reactions  . Amphetamine Itching  . Ibuprofen Hives and Rash    Pt states "It makes me break out on my backside"    REVIEW OF SYSTEMS (negative unless checked):   Cardiac:  []  Chest pain or chest pressure? []  Shortness of breath upon activity? []  Shortness of breath when lying flat? []  Irregular heart rhythm?  Vascular:  []  Pain in calf, thigh, or  hip brought on by walking? []  Pain in feet at night that wakes you up from your sleep? []  Blood clot in your veins? []  Leg swelling?  Pulmonary:  []  Oxygen at home? []  Productive cough? []  Wheezing?  Neurologic:  []  Sudden weakness in arms or legs? []  Sudden numbness in arms or legs? []  Sudden onset of difficult speaking or slurred speech? []  Temporary loss of vision in one eye? []  Problems with dizziness?  Gastrointestinal:  []  Blood in stool? []  Vomited blood?  Genitourinary:  []  Burning when urinating? []  Blood in urine?  Psychiatric:  []  Major depression  Hematologic:  []  Bleeding problems? []  Problems with blood clotting?  Dermatologic:  []  Rashes or ulcers?  Constitutional:  []  Fever or chills?  Ear/Nose/Throat:  []  Change in hearing? []  Nose bleeds? []  Sore throat?  Musculoskeletal:  []  Back pain? []  Joint pain? []  Muscle pain?   For VQI Use Only   PRE-ADM LIVING Home  AMB STATUS Ambulatory  CAD Sx None  PRIOR CHF None  STRESS TEST No   Physical Examination     Vitals:   11/13/17 1057  BP: (!) 167/95  Pulse: (!) 55  Resp: 18  Temp: (!) 97.2 F (36.2 C)  TempSrc: Oral  SpO2: 93%  Weight: 206 lb 14.4 oz (93.8 kg)  Height: 5\' 9"  (1.753 m)   Body mass index is 30.55 kg/m.  General Alert, O x 3, WD, NAD  Head [place]/AT,    Ear/Nose/ Throat Hearing grossly intact, nares without erythema or drainage, oropharynx without Erythema or Exudate, Mallampati score: 3,   Eyes PERRLA, EOMI,    Neck Supple, mid-line trachea,    Pulmonary Sym exp, good B air movt, CTA B  Cardiac RRR, Nl S1, S2, no Murmurs, No rubs, No S3,S4  Vascular Vessel Right Left  Radial Palpable Palpable  Brachial Palpable Palpable  Carotid Palpable, No Bruit Palpable, No Bruit  Aorta Not palpable N/A  Femoral Palpable Palpable  Popliteal Not palpable Not palpable  PT Not palpable Not palpable  DP Not palpable Not palpable    Gastro- intestinal soft, non-distended,  non-tender to palpation, No guarding or rebound, no HSM, no masses, no CVAT B, No palpable prominent aortic pulse,    Musculo- skeletal M/S 5/5 throughout  , Extremities without ischemic changes  , No edema present, No visible varicosities , No Lipodermatosclerosis present. No drainage or ulcers on right great toe.  R 3rd toe looks somewhat swollen without any TTP  Neurologic Cranial nerves 2-12 intact , Pain and light touch intact in extremities , Motor exam as listed above  Psychiatric Judgement intact, Mood & affect appropriate for pt's clinical situation  Dermatologic See M/S exam for extremity exam, No rashes otherwise noted  Lymphatic  Palpable lymph nodes: None    Non-Invasive Vascular imaging   BLE ABI (11/13/2017)  ABI Findings: Right Rt Pressure (mmHg) Index Waveform Comment   Brachial 169     ATA 109 0.64 monophasic    PTA 111 0.65 monophasic    Great Toe 96 0.56     Left Lt Pressure (mmHg) Index Waveform Comment  Brachial 171     ATA 134 0.78 monophasic    PTA 150 0.88 monophasic    Great Toe 93 0.54     ABI/TBI Today's ABI Today's TBI Previous ABI Previous TBI  Right 0.65  0.56  0.73  wound   Left 0.88  0.54  0.83  0.35      Previous ABI 07/26/15.   Final Interpretation: Right: Resting right ankle-brachial index indicates moderate right lower extremity arterial disease. The right toe-brachial index is abnormal. RT great toe pressure = 96 mmHg.  Left: Resting left ankle-brachial index indicates mild left lower extremity arterial disease. The left toe-brachial index is abnormal. ABIs are unreliable.   *See table(s) above for measurements and observations.   Outside Studies/Documentation   2 pages of outside documents were reviewed including: outside Pod chart.    Medical Decision Making   Kiara Little is a 82 y.o. female who presents with: RLE moderate PAD, LLE mild-mod PAD   Based on this patient's history and physical exam, I recommend: maximal  medical therapy  I discussed with the patient the natural history of intermittent claudication: 75% of patients have stable or improved symptoms in a year an only 2% require amputation. Eventually 20% may require intervention in a year.  I discussed in depth with the patient the nature of atherosclerosis, and emphasized the importance of maximal medical management including strict control of blood pressure, blood glucose, and lipid levels, antiplatelet agent, obtaining regular exercise, and cessation of smoking.    The patient is aware that without maximal medical management the underlying atherosclerotic disease process will progress, limiting the benefit of any interventions.  I discussed in depth with the patient a walking plan and how to execute such. The patient is currently not on a statin. Patient will be started on Lipitor 10 mg PO daily, to be titrated and managed by their primary physician.  The patient is currently not on an anti-platelet. Patient will be started on ASA 81 mg PO daily  Will recheck BLE ABI in 3 months.  Thank you for allowing [place] to participate in this patient's care.   Leonides Sake, MD, FACS [facility] Office: [phone] Pager: [phone]  11/13/2017, 11:53 AM

## 2017-11-14 DIAGNOSIS — M25511 Pain in right shoulder: Secondary | ICD-10-CM | POA: Diagnosis not present

## 2017-11-14 DIAGNOSIS — M25512 Pain in left shoulder: Secondary | ICD-10-CM | POA: Diagnosis not present

## 2017-11-14 DIAGNOSIS — M542 Cervicalgia: Secondary | ICD-10-CM | POA: Diagnosis not present

## 2017-11-14 DIAGNOSIS — M545 Low back pain: Secondary | ICD-10-CM | POA: Diagnosis not present

## 2017-11-14 DIAGNOSIS — R2689 Other abnormalities of gait and mobility: Secondary | ICD-10-CM | POA: Diagnosis not present

## 2017-11-19 ENCOUNTER — Other Ambulatory Visit: Payer: Self-pay

## 2017-11-19 DIAGNOSIS — M25511 Pain in right shoulder: Secondary | ICD-10-CM | POA: Diagnosis not present

## 2017-11-19 DIAGNOSIS — R2689 Other abnormalities of gait and mobility: Secondary | ICD-10-CM | POA: Diagnosis not present

## 2017-11-19 DIAGNOSIS — M545 Low back pain: Secondary | ICD-10-CM | POA: Diagnosis not present

## 2017-11-19 DIAGNOSIS — I739 Peripheral vascular disease, unspecified: Secondary | ICD-10-CM

## 2017-11-19 DIAGNOSIS — M25512 Pain in left shoulder: Secondary | ICD-10-CM | POA: Diagnosis not present

## 2017-11-19 DIAGNOSIS — M542 Cervicalgia: Secondary | ICD-10-CM | POA: Diagnosis not present

## 2017-11-21 DIAGNOSIS — R2689 Other abnormalities of gait and mobility: Secondary | ICD-10-CM | POA: Diagnosis not present

## 2017-11-21 DIAGNOSIS — M542 Cervicalgia: Secondary | ICD-10-CM | POA: Diagnosis not present

## 2017-11-21 DIAGNOSIS — M545 Low back pain: Secondary | ICD-10-CM | POA: Diagnosis not present

## 2017-11-21 DIAGNOSIS — M25512 Pain in left shoulder: Secondary | ICD-10-CM | POA: Diagnosis not present

## 2017-11-21 DIAGNOSIS — M25511 Pain in right shoulder: Secondary | ICD-10-CM | POA: Diagnosis not present

## 2017-11-26 DIAGNOSIS — M545 Low back pain: Secondary | ICD-10-CM | POA: Diagnosis not present

## 2017-11-26 DIAGNOSIS — M25511 Pain in right shoulder: Secondary | ICD-10-CM | POA: Diagnosis not present

## 2017-11-26 DIAGNOSIS — M542 Cervicalgia: Secondary | ICD-10-CM | POA: Diagnosis not present

## 2017-11-26 DIAGNOSIS — R2689 Other abnormalities of gait and mobility: Secondary | ICD-10-CM | POA: Diagnosis not present

## 2017-11-26 DIAGNOSIS — M25512 Pain in left shoulder: Secondary | ICD-10-CM | POA: Diagnosis not present

## 2017-11-28 DIAGNOSIS — M542 Cervicalgia: Secondary | ICD-10-CM | POA: Diagnosis not present

## 2017-11-28 DIAGNOSIS — M545 Low back pain: Secondary | ICD-10-CM | POA: Diagnosis not present

## 2017-11-28 DIAGNOSIS — M25512 Pain in left shoulder: Secondary | ICD-10-CM | POA: Diagnosis not present

## 2017-11-28 DIAGNOSIS — M25511 Pain in right shoulder: Secondary | ICD-10-CM | POA: Diagnosis not present

## 2017-11-28 DIAGNOSIS — R2689 Other abnormalities of gait and mobility: Secondary | ICD-10-CM | POA: Diagnosis not present

## 2017-12-02 DIAGNOSIS — M542 Cervicalgia: Secondary | ICD-10-CM | POA: Diagnosis not present

## 2017-12-02 DIAGNOSIS — M545 Low back pain: Secondary | ICD-10-CM | POA: Diagnosis not present

## 2017-12-02 DIAGNOSIS — M25511 Pain in right shoulder: Secondary | ICD-10-CM | POA: Diagnosis not present

## 2017-12-02 DIAGNOSIS — R2689 Other abnormalities of gait and mobility: Secondary | ICD-10-CM | POA: Diagnosis not present

## 2017-12-02 DIAGNOSIS — M25512 Pain in left shoulder: Secondary | ICD-10-CM | POA: Diagnosis not present

## 2017-12-04 DIAGNOSIS — R2689 Other abnormalities of gait and mobility: Secondary | ICD-10-CM | POA: Diagnosis not present

## 2017-12-04 DIAGNOSIS — M25512 Pain in left shoulder: Secondary | ICD-10-CM | POA: Diagnosis not present

## 2017-12-04 DIAGNOSIS — M25511 Pain in right shoulder: Secondary | ICD-10-CM | POA: Diagnosis not present

## 2017-12-04 DIAGNOSIS — M545 Low back pain: Secondary | ICD-10-CM | POA: Diagnosis not present

## 2017-12-04 DIAGNOSIS — M542 Cervicalgia: Secondary | ICD-10-CM | POA: Diagnosis not present

## 2017-12-09 DIAGNOSIS — R2689 Other abnormalities of gait and mobility: Secondary | ICD-10-CM | POA: Diagnosis not present

## 2017-12-09 DIAGNOSIS — M25512 Pain in left shoulder: Secondary | ICD-10-CM | POA: Diagnosis not present

## 2017-12-09 DIAGNOSIS — M542 Cervicalgia: Secondary | ICD-10-CM | POA: Diagnosis not present

## 2017-12-09 DIAGNOSIS — M25511 Pain in right shoulder: Secondary | ICD-10-CM | POA: Diagnosis not present

## 2017-12-09 DIAGNOSIS — M545 Low back pain: Secondary | ICD-10-CM | POA: Diagnosis not present

## 2017-12-16 DIAGNOSIS — M542 Cervicalgia: Secondary | ICD-10-CM | POA: Diagnosis not present

## 2017-12-16 DIAGNOSIS — M545 Low back pain: Secondary | ICD-10-CM | POA: Diagnosis not present

## 2017-12-16 DIAGNOSIS — M25511 Pain in right shoulder: Secondary | ICD-10-CM | POA: Diagnosis not present

## 2017-12-16 DIAGNOSIS — R2689 Other abnormalities of gait and mobility: Secondary | ICD-10-CM | POA: Diagnosis not present

## 2017-12-16 DIAGNOSIS — M25512 Pain in left shoulder: Secondary | ICD-10-CM | POA: Diagnosis not present

## 2017-12-18 DIAGNOSIS — R2689 Other abnormalities of gait and mobility: Secondary | ICD-10-CM | POA: Diagnosis not present

## 2017-12-18 DIAGNOSIS — M25511 Pain in right shoulder: Secondary | ICD-10-CM | POA: Diagnosis not present

## 2017-12-18 DIAGNOSIS — M25512 Pain in left shoulder: Secondary | ICD-10-CM | POA: Diagnosis not present

## 2017-12-18 DIAGNOSIS — M545 Low back pain: Secondary | ICD-10-CM | POA: Diagnosis not present

## 2017-12-18 DIAGNOSIS — M542 Cervicalgia: Secondary | ICD-10-CM | POA: Diagnosis not present

## 2017-12-23 DIAGNOSIS — M542 Cervicalgia: Secondary | ICD-10-CM | POA: Diagnosis not present

## 2017-12-23 DIAGNOSIS — R2689 Other abnormalities of gait and mobility: Secondary | ICD-10-CM | POA: Diagnosis not present

## 2017-12-23 DIAGNOSIS — M25512 Pain in left shoulder: Secondary | ICD-10-CM | POA: Diagnosis not present

## 2017-12-23 DIAGNOSIS — M545 Low back pain: Secondary | ICD-10-CM | POA: Diagnosis not present

## 2017-12-23 DIAGNOSIS — M25511 Pain in right shoulder: Secondary | ICD-10-CM | POA: Diagnosis not present

## 2017-12-25 DIAGNOSIS — M25512 Pain in left shoulder: Secondary | ICD-10-CM | POA: Diagnosis not present

## 2017-12-25 DIAGNOSIS — R2689 Other abnormalities of gait and mobility: Secondary | ICD-10-CM | POA: Diagnosis not present

## 2017-12-25 DIAGNOSIS — M542 Cervicalgia: Secondary | ICD-10-CM | POA: Diagnosis not present

## 2017-12-25 DIAGNOSIS — M25511 Pain in right shoulder: Secondary | ICD-10-CM | POA: Diagnosis not present

## 2017-12-25 DIAGNOSIS — M545 Low back pain: Secondary | ICD-10-CM | POA: Diagnosis not present

## 2018-01-01 DIAGNOSIS — M542 Cervicalgia: Secondary | ICD-10-CM | POA: Diagnosis not present

## 2018-01-01 DIAGNOSIS — M545 Low back pain: Secondary | ICD-10-CM | POA: Diagnosis not present

## 2018-01-01 DIAGNOSIS — R2689 Other abnormalities of gait and mobility: Secondary | ICD-10-CM | POA: Diagnosis not present

## 2018-01-01 DIAGNOSIS — M25512 Pain in left shoulder: Secondary | ICD-10-CM | POA: Diagnosis not present

## 2018-01-01 DIAGNOSIS — M25511 Pain in right shoulder: Secondary | ICD-10-CM | POA: Diagnosis not present

## 2018-01-03 DIAGNOSIS — M25511 Pain in right shoulder: Secondary | ICD-10-CM | POA: Diagnosis not present

## 2018-01-03 DIAGNOSIS — R2689 Other abnormalities of gait and mobility: Secondary | ICD-10-CM | POA: Diagnosis not present

## 2018-01-03 DIAGNOSIS — M25512 Pain in left shoulder: Secondary | ICD-10-CM | POA: Diagnosis not present

## 2018-01-03 DIAGNOSIS — M542 Cervicalgia: Secondary | ICD-10-CM | POA: Diagnosis not present

## 2018-01-03 DIAGNOSIS — M545 Low back pain: Secondary | ICD-10-CM | POA: Diagnosis not present

## 2018-01-30 DIAGNOSIS — M7989 Other specified soft tissue disorders: Secondary | ICD-10-CM | POA: Diagnosis not present

## 2018-01-30 DIAGNOSIS — B351 Tinea unguium: Secondary | ICD-10-CM | POA: Diagnosis not present

## 2018-02-05 ENCOUNTER — Encounter (HOSPITAL_COMMUNITY): Payer: Medicare Other

## 2018-02-05 ENCOUNTER — Ambulatory Visit: Payer: Medicare Other | Admitting: Vascular Surgery

## 2018-02-05 NOTE — Progress Notes (Deleted)
Established Intermittent Claudication   History of Present Illness   Kiara Little is a 82 y.o. (1936-04-05) female who presents with chief complaint: ***.  The patient previously had R great toe nail drainage.  This resolved with partial nail removal.  Patient previously did not have classic intermittent claudication.  The patient's symptoms have *** progressed.  The patient's symptoms are: ***.  The patient's treatment regimen currently included: maximal medical management and ***walking plan.  The patient's PMH, PSH, SH, and FamHx were reviewed on 02/05/18 are unchanged from 11/13/17.  Current Outpatient Medications  Medication Sig Dispense Refill  . amLODipine (NORVASC) 5 MG tablet Take 5 mg by mouth every morning.    Marland Kitchen atorvastatin (LIPITOR) 10 MG tablet Take 1 tablet (10 mg total) by mouth daily. 30 tablet 2  . diazepam (VALIUM) 5 MG tablet Take 0.5 tablets (2.5 mg total) by mouth every 8 (eight) hours as needed for muscle spasms (spasms). (Patient not taking: Reported on 11/13/2017) 7 tablet 0  . ibuprofen (ADVIL,MOTRIN) 800 MG tablet Take 1 tablet (800 mg total) by mouth 3 (three) times daily. (Patient not taking: Reported on 11/13/2017) 21 tablet 0  . methocarbamol (ROBAXIN) 500 MG tablet Take 1 or 2 po Q 6hrs for pain (Patient not taking: Reported on 11/13/2017) 60 tablet 0  . predniSONE (DELTASONE) 20 MG tablet Take 2 po QD x 4d then 1 po QD x 4d (Patient not taking: Reported on 11/13/2017) 12 tablet 0   No current facility-administered medications for this visit.     On ROS today: ***, ***.   Physical Examination  ***There were no vitals filed for this visit. ***There is no height or weight on file to calculate BMI.  General {LOC:19197::"Somulent","Alert"}, {Orientation:19197::"Confused","O x 3"}, {Weight:19197::"Obese","Cachectic","WD"}, {General state of health:19197::"Ill appearing","Elderly","NAD"}  Pulmonary {Chest wall:19197::"Asx chest movement","Sym exp"}, {Air  movt:19197::"Decreased *** air movt","good B air movt"}, {BS:19197::"rales on ***","rhonchi on ***","wheezing on ***","CTA B"}  Cardiac {Rhythm:19197::"Irregularly, irregular rate and rhythm","RRR, Nl S1, S2"}, {Murmur:19197::"Murmur present: ***","no Murmurs"}, {Rubs:19197::"Rub present: ***","No rubs"}, {Gallop:19197::"Gallop present: ***","No S3,S4"}  Vascular Vessel Right Left  Radial {Palpable:19197::"Not palpable","Faintly palpable","Palpable"} {Palpable:19197::"Not palpable","Faintly palpable","Palpable"}  Brachial {Palpable:19197::"Not palpable","Faintly palpable","Palpable"} {Palpable:19197::"Not palpable","Faintly palpable","Palpable"}  Carotid Palpable, {Bruit:19197::"Bruit present","No Bruit"} Palpable, {Bruit:19197::"Bruit present","No Bruit"}  Aorta Not palpable N/A  Femoral {Palpable:19197::"Not palpable","Faintly palpable","Palpable"} {Palpable:19197::"Not palpable","Faintly palpable","Palpable"}  Popliteal {Palpable:19197::"Prominently palpable","Not palpable"} {Palpable:19197::"Prominently palpable","Not palpable"}  PT {Palpable:19197::"Not palpable","Faintly palpable","Palpable"} {Palpable:19197::"Not palpable","Faintly palpable","Palpable"}  DP {Palpable:19197::"Not palpable","Faintly palpable","Palpable"} {Palpable:19197::"Not palpable","Faintly palpable","Palpable"}    Gastro- intestinal soft, {Distension:19197::"distended","non-distended"}, {TTP:19197::"TTP in *** quadrant","appropriate tenderness to palpation","non-tender to palpation"}, {Guarding:19197::"Guarding and rebound in *** quadrant","No guarding or rebound"}, {HSM:19197::"HSM present","no HSM"}, {Masses:19197::"Mass present: ***","no masses"}, {Flank:19197::"CVAT on ***","Flank bruit present on ***","no CVAT B"}, {AAA:19197::"Palpable prominent aortic pulse","No palpable prominent aortic pulse"}, {Surgical incision:19197::"Surg incisions: ***","Surgical incisions well healed"," "}  Musculo- skeletal M/S 5/5 throughout  {MS:19197::"except ***"," "}, Extremities without ischemic changes {MS:19197::"except ***"," "}, {Edema:19197::"Pitting edema present: ***","Non-pitting edema present: ***","No edema present"}, {Varicosities:19197::"Varicosities present: ***","No visible varicosities "}, {LDS:19197::"Lipodermatosclerosis present: ***","No Lipodermatosclerosis present"}  Neurologic Pain and light touch intact in extremities{CN:19197::" except for decreased sensation in ***"," "}, Motor exam as listed above    Non-Invasive Vascular imaging   ABI (***)  R:   ABI: *** (***),   PT: {Signals:19197::"none","mono","bi","tri"}  DP: {Signals:19197::"none","mono","bi","tri"}  TBI:  ***  L:   ABI: *** (***),   PT: {Signals:19197::"none","mono","bi","tri"}  DP: {Signals:19197::"none","mono","bi","tri"}  TBI: ***  Aortoiliac Duplex (***)  Ao: *** c/s  R iliac: *** c/s  L iliac: ***  c/s   Medical Decision Making   Annalyse P Little is a 82 y.o. female who presents with:  RLE moderate PAD, LLE mild-mod PAD   Based on the patient's vascular studies and examination, I have offered the patient: ***.  I discussed in depth with the patient the nature of atherosclerosis, and emphasized the importance of maximal medical management including strict control of blood pressure, blood glucose, and lipid levels, antiplatelet agents, obtaining regular exercise, and cessation of smoking.    The patient is aware that without maximal medical management the underlying atherosclerotic disease process will progress, limiting the benefit of any interventions.  The patient is currently {Statin:19197::"not on on statin as not medically indicated.","not on a statin due to reported allergy.","not on a statin. Patient will be started on Lipitor 10 mg PO daily, to be titrated and managed by their primary physician.","on a statin: Mevacor.","on a statin: Zocor.","on a statin: Lipitor.","on a statin: Pravachol.","on a statin:  Crestor.","on a statin: ***."}   The patient is currently {Antiplatelet:19197::"not on an anti-platelet due to allergy.","not on an anti-platelet due to bleeding risks related to use of an anticoagulant.","not on an anti-platelet. Patient will be started on ASA 81 mg PO daily","on an anti-platelet: ASA.","on an anti-platelet: Plavix.","on an anti-platelet: ASA and Plavix.","on an anti-platelet: ***."}  Thank you for allowing [place] to participate in this patient's care.   Leonides Sake, MD, FACS [facility] Office: [phone] Pager: [phone]

## 2018-02-27 DIAGNOSIS — B351 Tinea unguium: Secondary | ICD-10-CM | POA: Diagnosis not present

## 2018-02-27 DIAGNOSIS — M722 Plantar fascial fibromatosis: Secondary | ICD-10-CM | POA: Diagnosis not present

## 2018-02-27 DIAGNOSIS — L03031 Cellulitis of right toe: Secondary | ICD-10-CM | POA: Diagnosis not present

## 2018-02-27 DIAGNOSIS — L03032 Cellulitis of left toe: Secondary | ICD-10-CM | POA: Diagnosis not present

## 2018-03-11 NOTE — Progress Notes (Signed)
Established Intermittent Claudication   History of Present Illness   Kiara Little is a 82 y.o. (07/11/1936) female who presents with chief complaint: bilateral leg swelling.  The patient's symptoms have not progressed.  The patient's symptoms are: mild-mod swelling in legs with extended ambulation.  She continues to have "tightness" in both calves.  The patient's treatment regimen currently included: maximal medical management .  The patient's PMH, PSH, SH, and FamHx were reviewed on 03/12/18 are unchanged from 11/13/17.  Current Outpatient Medications  Medication Sig Dispense Refill  . amLODipine (NORVASC) 5 MG tablet Take 5 mg by mouth every morning.    Marland Kitchen atorvastatin (LIPITOR) 10 MG tablet Take 1 tablet (10 mg total) by mouth daily. 30 tablet 2  . diazepam (VALIUM) 5 MG tablet Take 0.5 tablets (2.5 mg total) by mouth every 8 (eight) hours as needed for muscle spasms (spasms). (Patient not taking: Reported on 11/13/2017) 7 tablet 0  . ibuprofen (ADVIL,MOTRIN) 800 MG tablet Take 1 tablet (800 mg total) by mouth 3 (three) times daily. (Patient not taking: Reported on 11/13/2017) 21 tablet 0  . methocarbamol (ROBAXIN) 500 MG tablet Take 1 or 2 po Q 6hrs for pain (Patient not taking: Reported on 11/13/2017) 60 tablet 0  . predniSONE (DELTASONE) 20 MG tablet Take 2 po QD x 4d then 1 po QD x 4d (Patient not taking: Reported on 11/13/2017) 12 tablet 0   No current facility-administered medications for this visit.     On ROS today: no wounds, no intermittent claudication or rest pain.   Physical Examination   Vitals:   03/12/18 1147  BP: 135/81  Pulse: (!) 59  Temp: (!) 97.2 F (36.2 C)  TempSrc: Oral  SpO2: 96%  Weight: 207 lb (93.9 kg)  Height: 5\' 9"  (1.753 m)   Body mass index is 30.57 kg/m.  General Alert, O x 3, WD, NAD  Pulmonary Sym exp, good B air movt, CTA B  Cardiac RRR, Nl S1, S2, no Murmurs, No rubs, No S3,S4  Vascular Vessel Right Left  Radial Palpable Palpable    Brachial Palpable Palpable  Carotid Palpable, No Bruit Palpable, No Bruit  Aorta Not palpable N/A  Femoral Palpable Palpable  Popliteal Not palpable Not palpable  PT Not palpable Not palpable  DP Not palpable Not palpable    Gastro- intestinal soft, non-distended, non-tender to palpation, No guarding or rebound, no HSM, no masses, no CVAT B, No palpable prominent aortic pulse,    Musculo- skeletal M/S 5/5 throughout  , Extremities without ischemic changes  , Pitting edema present: 1+ B, Varicosities present: few mod-large varicosities R>L, No Lipodermatosclerosis present  Neurologic Pain and light touch intact in extremities , Motor exam as listed above    Non-Invasive Vascular imaging   ABI (03/12/18)  R:   ABI: 0.60 (0.65),   PT: none  DP: mono  TBI:  0.28  L:   ABI: 0.67 (0.88),   PT: damp. mono  DP: mono  TBI: 0.38   Medical Decision Making   Kiara Little is a 82 y.o. female who presents with:  RLE moderate PAD, LLE mild-mod PAD, likely BLE CVI   I recommended trying OTC compression stockings also.  Based on the patient's vascular studies and examination, I have offered the patient: q3 month ABI.  I discussed in depth with the patient the nature of atherosclerosis, and emphasized the importance of maximal medical management including strict control of blood pressure, blood glucose, and lipid  levels, antiplatelet agents, obtaining regular exercise, and cessation of smoking.    The patient is aware that without maximal medical management the underlying atherosclerotic disease process will progress, limiting the benefit of any interventions.  The patient is currently on a statin: Lipitor.   The patient is currently on an anti-platelet: ASA.  Thank you for allowing us to participate in this patient's care.   Leonides SakeBrian Denice Cardon, MD, FACS [facility] [place] Office: [phone] Pager: [phone]

## 2018-03-12 ENCOUNTER — Ambulatory Visit (HOSPITAL_COMMUNITY)
Admission: RE | Admit: 2018-03-12 | Discharge: 2018-03-12 | Disposition: A | Discharge status: Home / Self Care | Payer: Medicare Other | Source: Ambulatory Visit | Attending: Vascular Surgery | Admitting: Vascular Surgery

## 2018-03-12 ENCOUNTER — Ambulatory Visit (INDEPENDENT_AMBULATORY_CARE_PROVIDER_SITE_OTHER): Payer: Medicare Other | Admitting: Vascular Surgery

## 2018-03-12 ENCOUNTER — Encounter: Payer: Self-pay | Admitting: Vascular Surgery

## 2018-03-12 ENCOUNTER — Ambulatory Visit: Payer: Medicare Other | Admitting: Vascular Surgery

## 2018-03-12 VITALS — BP 135/81 | HR 59 | Temp 97.2°F | Ht 69.0 in | Wt 207.0 lb

## 2018-03-12 DIAGNOSIS — I70213 Atherosclerosis of native arteries of extremities with intermittent claudication, bilateral legs: Secondary | ICD-10-CM

## 2018-03-12 DIAGNOSIS — R9389 Abnormal findings on diagnostic imaging of other specified body structures: Secondary | ICD-10-CM | POA: Insufficient documentation

## 2018-03-12 DIAGNOSIS — R0989 Other specified symptoms and signs involving the circulatory and respiratory systems: Secondary | ICD-10-CM | POA: Insufficient documentation

## 2018-03-12 DIAGNOSIS — I10 Essential (primary) hypertension: Secondary | ICD-10-CM | POA: Diagnosis not present

## 2018-03-12 DIAGNOSIS — Z87891 Personal history of nicotine dependence: Secondary | ICD-10-CM | POA: Diagnosis not present

## 2018-03-12 DIAGNOSIS — I739 Peripheral vascular disease, unspecified: Secondary | ICD-10-CM | POA: Diagnosis not present

## 2018-03-21 ENCOUNTER — Other Ambulatory Visit: Payer: Self-pay | Admitting: Vascular Surgery

## 2018-03-21 MED ORDER — ATORVASTATIN CALCIUM 10 MG PO TABS: 10.0000 mg | ORAL_TABLET | Freq: Every day | ORAL | 3 refills | Status: DC

## 2018-04-22 DIAGNOSIS — H25813 Combined forms of age-related cataract, bilateral: Secondary | ICD-10-CM | POA: Diagnosis not present

## 2018-04-22 DIAGNOSIS — H40013 Open angle with borderline findings, low risk, bilateral: Secondary | ICD-10-CM | POA: Diagnosis not present

## 2018-04-22 DIAGNOSIS — H16223 Keratoconjunctivitis sicca, not specified as Sjogren's, bilateral: Secondary | ICD-10-CM | POA: Diagnosis not present

## 2018-04-23 DIAGNOSIS — M130 Polyarthritis, unspecified: Secondary | ICD-10-CM | POA: Diagnosis not present

## 2018-04-23 DIAGNOSIS — I10 Essential (primary) hypertension: Secondary | ICD-10-CM | POA: Diagnosis not present

## 2018-05-05 DIAGNOSIS — M7751 Other enthesopathy of right foot: Secondary | ICD-10-CM | POA: Diagnosis not present

## 2018-05-05 DIAGNOSIS — G5761 Lesion of plantar nerve, right lower limb: Secondary | ICD-10-CM | POA: Diagnosis not present

## 2018-05-23 DIAGNOSIS — M7751 Other enthesopathy of right foot: Secondary | ICD-10-CM | POA: Diagnosis not present

## 2018-05-23 DIAGNOSIS — G5761 Lesion of plantar nerve, right lower limb: Secondary | ICD-10-CM | POA: Diagnosis not present

## 2018-05-27 DIAGNOSIS — E559 Vitamin D deficiency, unspecified: Secondary | ICD-10-CM | POA: Diagnosis not present

## 2018-05-27 DIAGNOSIS — I10 Essential (primary) hypertension: Secondary | ICD-10-CM | POA: Diagnosis not present

## 2018-05-27 DIAGNOSIS — R5383 Other fatigue: Secondary | ICD-10-CM | POA: Diagnosis not present

## 2018-05-27 DIAGNOSIS — M130 Polyarthritis, unspecified: Secondary | ICD-10-CM | POA: Diagnosis not present

## 2018-06-06 DIAGNOSIS — M2041 Other hammer toe(s) (acquired), right foot: Secondary | ICD-10-CM | POA: Diagnosis not present

## 2018-06-06 DIAGNOSIS — G5761 Lesion of plantar nerve, right lower limb: Secondary | ICD-10-CM | POA: Diagnosis not present

## 2018-06-06 DIAGNOSIS — M2042 Other hammer toe(s) (acquired), left foot: Secondary | ICD-10-CM | POA: Diagnosis not present

## 2018-06-06 DIAGNOSIS — M7751 Other enthesopathy of right foot: Secondary | ICD-10-CM | POA: Diagnosis not present

## 2018-06-12 ENCOUNTER — Other Ambulatory Visit: Payer: Self-pay

## 2018-06-12 DIAGNOSIS — I739 Peripheral vascular disease, unspecified: Secondary | ICD-10-CM

## 2018-06-12 DIAGNOSIS — I70213 Atherosclerosis of native arteries of extremities with intermittent claudication, bilateral legs: Secondary | ICD-10-CM

## 2018-06-12 DIAGNOSIS — R0989 Other specified symptoms and signs involving the circulatory and respiratory systems: Secondary | ICD-10-CM

## 2018-06-17 ENCOUNTER — Other Ambulatory Visit: Payer: Self-pay | Admitting: Vascular Surgery

## 2018-06-24 ENCOUNTER — Ambulatory Visit (INDEPENDENT_AMBULATORY_CARE_PROVIDER_SITE_OTHER): Payer: Medicare Other | Admitting: Vascular Surgery

## 2018-06-24 ENCOUNTER — Other Ambulatory Visit: Payer: Self-pay

## 2018-06-24 ENCOUNTER — Encounter: Payer: Self-pay | Admitting: Vascular Surgery

## 2018-06-24 ENCOUNTER — Ambulatory Visit (HOSPITAL_COMMUNITY)
Admission: RE | Admit: 2018-06-24 | Discharge: 2018-06-24 | Disposition: A | Discharge status: Home / Self Care | Payer: Medicare Other | Source: Ambulatory Visit | Attending: Vascular Surgery | Admitting: Vascular Surgery

## 2018-06-24 VITALS — BP 147/90 | HR 57 | Temp 97.6°F | Resp 14 | Ht 69.0 in | Wt 204.0 lb

## 2018-06-24 DIAGNOSIS — I739 Peripheral vascular disease, unspecified: Secondary | ICD-10-CM

## 2018-06-24 DIAGNOSIS — R0989 Other specified symptoms and signs involving the circulatory and respiratory systems: Secondary | ICD-10-CM | POA: Insufficient documentation

## 2018-06-24 DIAGNOSIS — I70213 Atherosclerosis of native arteries of extremities with intermittent claudication, bilateral legs: Secondary | ICD-10-CM | POA: Diagnosis not present

## 2018-06-24 NOTE — Progress Notes (Signed)
Patient name: Kiara Little MRN: [id number] DOB: [birth year] Sex: female  REASON FOR VISIT: 59-month follow-up with ABIs  HPI: Kiara Little is a 82 y.o. female who was previously evaluated by Dr. Imogene Burn for bilateral leg swelling that presents for 38-month follow-up with ABIs (given hx claudication as well).  Patient states her leg swelling is essentially unchanged.  Dr. Imogene Burn previously recommended bilateral lower extremity compression hose which she has not been able to purchase at this time.  She also has moderate PAD and ABIs were scheduled at this follow-up appointment. In discussing her lower leg symptoms she complains of cramping at night.  She states the cramping "moves around from her calf to her thigh."  She denies any overt symptoms in the foot or toes itself.  During the day when she is walking around she states she has no pain in her calves.  No tissue loss.  Past Medical History:  Diagnosis Date  . Arthritis    OA LEFT HIP   . Chest pain   . Hypertension   . Muscle spasm   . Noncompliance   . Osteoarthritis    a. s/p R THA  . Pain    BOTH FEET "TIGHTNESS" "NO NERVE PAIN" - TAKES GABAPENTIN  . Tobacco abuse     Past Surgical History:  Procedure Laterality Date  . BACK SURGERY    . JOINT REPLACEMENT  2000   RIGHT TOTAL HIP ARTHROPLASTY  . SURGERY FOR TUBAL PREGNANCY    . TOTAL HIP ARTHROPLASTY Left 07/24/2013   Procedure: LEFT TOTAL HIP ARTHROPLASTY ANTERIOR APPROACH;  Surgeon: Kathryne Hitch, MD;  Location: [facility];  Service: Orthopedics;  Laterality: Left;    Family History  Problem Relation Age of Onset  . Heart attack Mother        died @ 22  . Diabetes Father        died in his 9's  . Cancer Brother        deceased    SOCIAL HISTORY: Social History   Tobacco Use  . Smoking status: Former Smoker    Packs/day: 0.50    Years: 40.00    Pack years: 20.00    Types: Cigarettes    Last attempt to quit: 07/24/2013    Years since quitting: 4.9  .  Smokeless tobacco: Never Used  Substance Use Topics  . Alcohol use: No    Allergies  Allergen Reactions  . Amphetamine Itching  . Ibuprofen Hives and Rash    Pt states "It makes me break out on my backside"    Current Outpatient Medications  Medication Sig Dispense Refill  . amLODipine (NORVASC) 5 MG tablet Take 5 mg by mouth every morning.    Marland Kitchen atorvastatin (LIPITOR) 10 MG tablet Take 1 tablet (10 mg total) by mouth daily. 30 tablet 3  . diazepam (VALIUM) 5 MG tablet Take 0.5 tablets (2.5 mg total) by mouth every 8 (eight) hours as needed for muscle spasms (spasms). 7 tablet 0  . ibuprofen (ADVIL,MOTRIN) 800 MG tablet Take 1 tablet (800 mg total) by mouth 3 (three) times daily. 21 tablet 0  . methocarbamol (ROBAXIN) 500 MG tablet Take 1 or 2 po Q 6hrs for pain 60 tablet 0  . predniSONE (DELTASONE) 20 MG tablet Take 2 po QD x 4d then 1 po QD x 4d 12 tablet 0   No current facility-administered medications for this visit.     REVIEW OF SYSTEMS:  [X]  denotes positive finding, [ ]   denotes negative finding Cardiac  Comments:  Chest pain or chest pressure:    Shortness of breath upon exertion:    Short of breath when lying flat:    Irregular heart rhythm:        Vascular    Pain in calf, thigh, or hip brought on by ambulation:    Pain in feet at night that wakes you up from your sleep:     Blood clot in your veins:    Leg swelling:  x       Pulmonary    Oxygen at home:    Productive cough:     Wheezing:         Neurologic    Sudden weakness in arms or legs:     Sudden numbness in arms or legs:     Sudden onset of difficulty speaking or slurred speech:    Temporary loss of vision in one eye:     Problems with dizziness:         Gastrointestinal    Blood in stool:     Vomited blood:         Genitourinary    Burning when urinating:     Blood in urine:        Psychiatric    Major depression:         Hematologic    Bleeding problems:    Problems with blood clotting  too easily:        Skin    Rashes or ulcers:        Constitutional    Fever or chills:      PHYSICAL EXAM: Vitals:   06/24/18 0848 06/24/18 0854  BP: (!) 150/88 (!) 147/90  Pulse: (!) 57 (!) 57  Resp: 14   Temp: 97.6 F (36.4 C)   TempSrc: Oral   SpO2: 98%   Weight: 92.5 kg   Height: 5\' 9"  (1.753 m)     GENERAL: The patient is a well-nourished female, in no acute distress. The vital signs are documented above. CARDIAC: There is a regular rate and rhythm.  VASCULAR:  2+ radial pulse palpable BUE 2+ femoral pulse palpable bilateral groins No palpable pedal pulses Monophasic DP signals BLE PULMONARY: There is good air exchange bilaterally without wheezing or rales. ABDOMEN: Soft and non-tender with normal pitched bowel sounds.  MUSCULOSKELETAL: There are no major deformities or cyanosis. NEUROLOGIC: No focal weakness or paresthesias are detected. SKIN: There are no ulcers or rashes noted. PSYCHIATRIC: The patient has a normal affect.  DATA:   I have independelty reviewed her ABIs which show an ABI of 0.57 on the right and 0.49 on the left with monophasic waveforms.  Assessment/Plan:  83 year old female who presents for 65-month interval follow-up in the setting of moderate PAD.  She has had a drop in her ABIs now 0.49 on the right and 0.57 on the left and previously in the 0.6 range.  However, her symptoms are unchanged since her last visit.  She has no tissue loss and no overt rest pain.  Even her cramping history is atypical given it does not bother her when she walks.  I see no role for aggressive intervention at this time.  Patient wants to maintain close interval follow-up.  We will see her back in 3 months with another set of ABIs.  She is hoping to get her compression socks that Dr. Imogene Burn previously prescribed.   Cephus Shelling, MD [facility] Office:  [phone] Pager: [phone]  Cephus Shelling

## 2018-06-24 NOTE — Progress Notes (Signed)
Vitals:   06/24/18 0848  BP: (!) 150/88  Pulse: (!) 57  Resp: 14  Temp: 97.6 F (36.4 C)  TempSrc: Oral  SpO2: 98%  Weight: 204 lb (92.5 kg)  Height: 5\' 9"  (1.753 m)

## 2018-06-26 DIAGNOSIS — M7751 Other enthesopathy of right foot: Secondary | ICD-10-CM | POA: Diagnosis not present

## 2018-06-26 DIAGNOSIS — Z0000 Encounter for general adult medical examination without abnormal findings: Secondary | ICD-10-CM | POA: Diagnosis not present

## 2018-06-26 DIAGNOSIS — M62838 Other muscle spasm: Secondary | ICD-10-CM | POA: Diagnosis not present

## 2018-06-26 DIAGNOSIS — M130 Polyarthritis, unspecified: Secondary | ICD-10-CM | POA: Diagnosis not present

## 2018-06-26 DIAGNOSIS — I10 Essential (primary) hypertension: Secondary | ICD-10-CM | POA: Diagnosis not present

## 2018-06-26 DIAGNOSIS — G5761 Lesion of plantar nerve, right lower limb: Secondary | ICD-10-CM | POA: Diagnosis not present

## 2018-07-08 ENCOUNTER — Other Ambulatory Visit: Payer: Self-pay

## 2018-07-08 NOTE — Patient Outreach (Signed)
[facility]) Care Management  07/08/2018  Kiara Little [birth year] [id number]   Medication Adherence call to [name] spoke with patient she still has medication on Atorvastatin 10 mg patient said she take 1 tablet on a regular basis. [name] is showing past due under [facility] Ins.   Lillia Abed CPhT Pharmacy Technician [facility] Management Direct Dial [phone]  Fax [phone] [email or web address]

## 2018-07-11 ENCOUNTER — Other Ambulatory Visit: Payer: Self-pay

## 2018-07-11 DIAGNOSIS — R0989 Other specified symptoms and signs involving the circulatory and respiratory systems: Secondary | ICD-10-CM

## 2018-07-11 DIAGNOSIS — I739 Peripheral vascular disease, unspecified: Secondary | ICD-10-CM

## 2018-07-11 DIAGNOSIS — I70213 Atherosclerosis of native arteries of extremities with intermittent claudication, bilateral legs: Secondary | ICD-10-CM

## 2018-07-29 DIAGNOSIS — I7389 Other specified peripheral vascular diseases: Secondary | ICD-10-CM | POA: Diagnosis not present

## 2018-07-29 DIAGNOSIS — M130 Polyarthritis, unspecified: Secondary | ICD-10-CM | POA: Diagnosis not present

## 2018-07-29 DIAGNOSIS — L03119 Cellulitis of unspecified part of limb: Secondary | ICD-10-CM | POA: Diagnosis not present

## 2018-07-29 DIAGNOSIS — I10 Essential (primary) hypertension: Secondary | ICD-10-CM | POA: Diagnosis not present

## 2018-10-03 DIAGNOSIS — Z1159 Encounter for screening for other viral diseases: Secondary | ICD-10-CM | POA: Diagnosis not present

## 2018-10-03 DIAGNOSIS — I10 Essential (primary) hypertension: Secondary | ICD-10-CM | POA: Diagnosis not present

## 2018-10-07 ENCOUNTER — Encounter: Payer: Self-pay | Admitting: Vascular Surgery

## 2018-10-07 ENCOUNTER — Ambulatory Visit (HOSPITAL_COMMUNITY)
Admission: RE | Admit: 2018-10-07 | Discharge: 2018-10-07 | Disposition: A | Discharge status: Home / Self Care | Payer: Medicare Other | Source: Ambulatory Visit | Attending: Vascular Surgery | Admitting: Vascular Surgery

## 2018-10-07 ENCOUNTER — Ambulatory Visit (INDEPENDENT_AMBULATORY_CARE_PROVIDER_SITE_OTHER): Payer: Medicare Other | Admitting: Vascular Surgery

## 2018-10-07 ENCOUNTER — Other Ambulatory Visit: Payer: Self-pay

## 2018-10-07 VITALS — BP 152/94 | HR 62 | Temp 97.5°F | Resp 20 | Ht 69.0 in | Wt 204.0 lb

## 2018-10-07 DIAGNOSIS — I739 Peripheral vascular disease, unspecified: Secondary | ICD-10-CM

## 2018-10-07 DIAGNOSIS — R0989 Other specified symptoms and signs involving the circulatory and respiratory systems: Secondary | ICD-10-CM | POA: Diagnosis not present

## 2018-10-07 DIAGNOSIS — I70213 Atherosclerosis of native arteries of extremities with intermittent claudication, bilateral legs: Secondary | ICD-10-CM

## 2018-10-07 MED ORDER — ATORVASTATIN CALCIUM 10 MG PO TABS: 10.0000 mg | ORAL_TABLET | Freq: Every day | ORAL | 11 refills | Status: DC

## 2018-10-07 NOTE — Progress Notes (Signed)
Patient name: Kiara Little MRN: [id number] DOB: January 11, 1936 Sex: female  REASON FOR VISIT: 73-month follow-up with ABIs for moderate PAD  HPI: Kiara Little is a 83 y.o. female who was previously evaluated by Dr. Imogene Burn for bilateral leg swelling that presents for 57-month follow-up with ABIs (given hx claudication as well).  Patient states her leg swelling is essentially unchanged.  Dr. Imogene Burn previously recommended bilateral lower extremity compression hose.  She also has moderate PAD and after last visit discussed short interval follow-up. In discussing her lower leg symptoms she complains of cramping at night in her toes that always gets better with peroxide.  No cramping when she walks.  No "pain."  No non-healing wound.  Scratched her right shin recently and healing fine.  Past Medical History:  Diagnosis Date  . Arthritis    OA LEFT HIP   . Chest pain   . Hypertension   . Muscle spasm   . Noncompliance   . Osteoarthritis    a. s/p R THA  . Pain    BOTH FEET "TIGHTNESS" "NO NERVE PAIN" - TAKES GABAPENTIN  . Tobacco abuse     Past Surgical History:  Procedure Laterality Date  . BACK SURGERY    . JOINT REPLACEMENT  2000   RIGHT TOTAL HIP ARTHROPLASTY  . SURGERY FOR TUBAL PREGNANCY    . TOTAL HIP ARTHROPLASTY Left 07/24/2013   Procedure: LEFT TOTAL HIP ARTHROPLASTY ANTERIOR APPROACH;  Surgeon: Kathryne Hitch, MD;  Location: [facility];  Service: Orthopedics;  Laterality: Left;    Family History  Problem Relation Age of Onset  . Heart attack Mother        died @ 10  . Diabetes Father        died in his 6's  . Cancer Brother        deceased    SOCIAL HISTORY: Social History   Tobacco Use  . Smoking status: Former Smoker    Packs/day: 0.50    Years: 40.00    Pack years: 20.00    Types: Cigarettes    Last attempt to quit: 07/24/2013    Years since quitting: 5.2  . Smokeless tobacco: Never Used  Substance Use Topics  . Alcohol use: No    Allergies  Allergen  Reactions  . Amphetamine Itching  . Ibuprofen Hives and Rash    Pt states "It makes me break out on my backside"    Current Outpatient Medications  Medication Sig Dispense Refill  . amLODipine (NORVASC) 5 MG tablet Take 5 mg by mouth every morning.    . predniSONE (DELTASONE) 20 MG tablet Take 2 po QD x 4d then 1 po QD x 4d 12 tablet 0  . atorvastatin (LIPITOR) 10 MG tablet Take 1 tablet (10 mg total) by mouth daily. 30 tablet 11  . diazepam (VALIUM) 5 MG tablet Take 0.5 tablets (2.5 mg total) by mouth every 8 (eight) hours as needed for muscle spasms (spasms). (Patient not taking: Reported on 10/07/2018) 7 tablet 0  . ibuprofen (ADVIL,MOTRIN) 800 MG tablet Take 1 tablet (800 mg total) by mouth 3 (three) times daily. (Patient not taking: Reported on 10/07/2018) 21 tablet 0  . methocarbamol (ROBAXIN) 500 MG tablet Take 1 or 2 po Q 6hrs for pain (Patient not taking: Reported on 10/07/2018) 60 tablet 0   No current facility-administered medications for this visit.     REVIEW OF SYSTEMS:  [X]  denotes positive finding, [ ]  denotes negative finding Cardiac  Comments:  Chest pain or chest pressure:    Shortness of breath upon exertion:    Short of breath when lying flat:    Irregular heart rhythm:        Vascular    Pain in calf, thigh, or hip brought on by ambulation:    Pain in feet at night that wakes you up from your sleep:     Blood clot in your veins:    Leg swelling:  x       Pulmonary    Oxygen at home:    Productive cough:     Wheezing:         Neurologic    Sudden weakness in arms or legs:     Sudden numbness in arms or legs:     Sudden onset of difficulty speaking or slurred speech:    Temporary loss of vision in one eye:     Problems with dizziness:         Gastrointestinal    Blood in stool:     Vomited blood:         Genitourinary    Burning when urinating:     Blood in urine:        Psychiatric    Major depression:         Hematologic    Bleeding problems:     Problems with blood clotting too easily:        Skin    Rashes or ulcers:        Constitutional    Fever or chills:      PHYSICAL EXAM: Vitals:   10/07/18 1056  BP: (!) 152/94  Pulse: 62  Resp: 20  Temp: (!) 97.5 F (36.4 C)  SpO2: 96%  Weight: 204 lb (92.5 kg)  Height: 5\' 9"  (1.753 m)    GENERAL: The patient is a well-nourished female, in no acute distress. The vital signs are documented above. CARDIAC: There is a regular rate and rhythm.  VASCULAR:  2+ radial pulse palpable bilateral upper extremities 2+ femoral pulse palpable bilateral groins Monophasic DP signals BLE No distal tissue loss ABDOMEN: Soft and non-tender. MUSCULOSKELETAL: There are no major deformities or cyanosis. NEUROLOGIC: No focal weakness or paresthesias are detected. SKIN: There are no ulcers or rashes noted. PSYCHIATRIC: The patient has a normal affect.  DATA:   ABI's 0.52 right and monophasic.  0.72 left and biphasic.  Assessment/Plan:  83 year old female who presents for 59-month interval follow-up in the setting of moderate PAD.  I do not think her symptoms warrant intervention att his time since only intermittent cramping in toes and no cramping with walking.  Symptoms not suggestive of rest pain.  She does have moderate PAD and discussed aspirin, statin, etc.  Will stretch our her next visit to 6 months with ABIs and discussed she contact us if symptoms worsen.     Cephus Shelling, MD [facility] [place] Office: [phone] Pager: [phone]  Cephus Shelling

## 2018-11-13 DIAGNOSIS — M79602 Pain in left arm: Secondary | ICD-10-CM | POA: Diagnosis not present

## 2018-11-13 DIAGNOSIS — M545 Low back pain: Secondary | ICD-10-CM | POA: Diagnosis not present

## 2018-11-13 DIAGNOSIS — M25552 Pain in left hip: Secondary | ICD-10-CM | POA: Diagnosis not present

## 2018-11-18 DIAGNOSIS — M545 Low back pain: Secondary | ICD-10-CM | POA: Diagnosis not present

## 2018-11-18 DIAGNOSIS — M25552 Pain in left hip: Secondary | ICD-10-CM | POA: Diagnosis not present

## 2018-11-18 DIAGNOSIS — M79602 Pain in left arm: Secondary | ICD-10-CM | POA: Diagnosis not present

## 2018-11-21 DIAGNOSIS — M79602 Pain in left arm: Secondary | ICD-10-CM | POA: Diagnosis not present

## 2018-11-21 DIAGNOSIS — M25552 Pain in left hip: Secondary | ICD-10-CM | POA: Diagnosis not present

## 2018-11-21 DIAGNOSIS — M545 Low back pain: Secondary | ICD-10-CM | POA: Diagnosis not present

## 2018-11-28 DIAGNOSIS — M79602 Pain in left arm: Secondary | ICD-10-CM | POA: Diagnosis not present

## 2018-11-28 DIAGNOSIS — Z0001 Encounter for general adult medical examination with abnormal findings: Secondary | ICD-10-CM | POA: Diagnosis not present

## 2018-11-28 DIAGNOSIS — M545 Low back pain: Secondary | ICD-10-CM | POA: Diagnosis not present

## 2018-11-28 DIAGNOSIS — Z008 Encounter for other general examination: Secondary | ICD-10-CM | POA: Diagnosis not present

## 2018-11-28 DIAGNOSIS — M25552 Pain in left hip: Secondary | ICD-10-CM | POA: Diagnosis not present

## 2018-12-02 DIAGNOSIS — M79602 Pain in left arm: Secondary | ICD-10-CM | POA: Diagnosis not present

## 2018-12-02 DIAGNOSIS — M25552 Pain in left hip: Secondary | ICD-10-CM | POA: Diagnosis not present

## 2018-12-02 DIAGNOSIS — M545 Low back pain: Secondary | ICD-10-CM | POA: Diagnosis not present

## 2018-12-04 DIAGNOSIS — M79602 Pain in left arm: Secondary | ICD-10-CM | POA: Diagnosis not present

## 2018-12-04 DIAGNOSIS — M25552 Pain in left hip: Secondary | ICD-10-CM | POA: Diagnosis not present

## 2018-12-04 DIAGNOSIS — M545 Low back pain: Secondary | ICD-10-CM | POA: Diagnosis not present

## 2019-01-09 DIAGNOSIS — M7752 Other enthesopathy of left foot: Secondary | ICD-10-CM | POA: Diagnosis not present

## 2019-01-09 DIAGNOSIS — L600 Ingrowing nail: Secondary | ICD-10-CM | POA: Diagnosis not present

## 2019-01-15 DIAGNOSIS — Z008 Encounter for other general examination: Secondary | ICD-10-CM | POA: Diagnosis not present

## 2019-01-15 DIAGNOSIS — I10 Essential (primary) hypertension: Secondary | ICD-10-CM | POA: Diagnosis not present

## 2019-01-15 DIAGNOSIS — M549 Dorsalgia, unspecified: Secondary | ICD-10-CM | POA: Diagnosis not present

## 2019-01-15 DIAGNOSIS — G629 Polyneuropathy, unspecified: Secondary | ICD-10-CM | POA: Diagnosis not present

## 2019-01-15 DIAGNOSIS — I739 Peripheral vascular disease, unspecified: Secondary | ICD-10-CM | POA: Diagnosis not present

## 2019-01-23 DIAGNOSIS — T8189XA Other complications of procedures, not elsewhere classified, initial encounter: Secondary | ICD-10-CM | POA: Diagnosis not present

## 2019-02-03 DIAGNOSIS — M109 Gout, unspecified: Secondary | ICD-10-CM | POA: Diagnosis not present

## 2019-02-11 DIAGNOSIS — L97521 Non-pressure chronic ulcer of other part of left foot limited to breakdown of skin: Secondary | ICD-10-CM | POA: Diagnosis not present

## 2019-02-11 DIAGNOSIS — T8189XD Other complications of procedures, not elsewhere classified, subsequent encounter: Secondary | ICD-10-CM | POA: Diagnosis not present

## 2019-02-13 DIAGNOSIS — L89620 Pressure ulcer of left heel, unstageable: Secondary | ICD-10-CM | POA: Diagnosis not present

## 2019-03-25 ENCOUNTER — Other Ambulatory Visit: Payer: Self-pay

## 2019-03-25 ENCOUNTER — Ambulatory Visit (INDEPENDENT_AMBULATORY_CARE_PROVIDER_SITE_OTHER): Payer: Medicare Other | Admitting: Podiatry

## 2019-03-25 VITALS — Temp 97.4°F

## 2019-03-25 DIAGNOSIS — L97522 Non-pressure chronic ulcer of other part of left foot with fat layer exposed: Secondary | ICD-10-CM | POA: Diagnosis not present

## 2019-03-25 DIAGNOSIS — I739 Peripheral vascular disease, unspecified: Secondary | ICD-10-CM

## 2019-03-25 DIAGNOSIS — M7752 Other enthesopathy of left foot: Secondary | ICD-10-CM

## 2019-03-25 DIAGNOSIS — H04123 Dry eye syndrome of bilateral lacrimal glands: Secondary | ICD-10-CM | POA: Insufficient documentation

## 2019-03-25 MED ORDER — HYDROCODONE-ACETAMINOPHEN 7.5-300 MG PO TABS: 1.0000 | ORAL_TABLET | Freq: Three times a day (TID) | ORAL | 0 refills | Status: DC | PRN

## 2019-03-25 MED ORDER — GENTAMICIN SULFATE 0.1 % EX CREA: 1.0000 "application " | TOPICAL_CREAM | Freq: Two times a day (BID) | CUTANEOUS | 1 refills | Status: DC

## 2019-03-26 ENCOUNTER — Telehealth: Payer: Self-pay | Admitting: *Deleted

## 2019-03-26 ENCOUNTER — Other Ambulatory Visit: Payer: Self-pay | Admitting: Podiatry

## 2019-03-26 MED ORDER — HYDROCODONE-ACETAMINOPHEN 7.5-300 MG PO TABS: 1.0000 | ORAL_TABLET | Freq: Three times a day (TID) | ORAL | 0 refills | Status: DC | PRN

## 2019-03-26 NOTE — Telephone Encounter (Signed)
Pt called requested Hydrocodone prescription of 03/25/2019 sent to [facility] because [facility] doesn't have it.

## 2019-03-26 NOTE — Telephone Encounter (Signed)
Done

## 2019-03-30 NOTE — Progress Notes (Signed)
   HPI: 83 y.o. female presenting today as a new patient with a chief complaint of continued pain from a nail avulsion of the left great toe that occurred two months ago. She reports associated drainage from the wound. She also notes a callus lesion of the right foot that has now become bothersome. Walking and touching the area increases the pain from both areas. She has not had any treatment for her symptoms. Patient is here for further evaluation and treatment.   Past Medical History:  Diagnosis Date  . Arthritis    OA LEFT HIP   . Chest pain   . Hypertension   . Muscle spasm   . Noncompliance   . Osteoarthritis    a. s/p R THA  . Pain    BOTH FEET "TIGHTNESS" "NO NERVE PAIN" - TAKES GABAPENTIN  . Tobacco abuse      Physical Exam: General: The patient is alert and oriented x3 in no acute distress.  Dermatology: Hyperkeratotic lesion present on the right foot x 1. Pain on palpation with a central nucleated core noted. Skin is warm, dry and supple bilateral lower extremities.   Wound #1 noted to the left great toe measuring 0.8 x 1.0 x 0.2 cm.   To the above-noted ulceration, there is no eschar. There is a moderate amount of slough, fibrin and necrotic tissue. Granulation tissue and wound base is red. There is no malodor. There is a minimal amount of serosanginous drainage noted. Periwound integrity is intact.   Vascular: Diminished pedal pulses bilaterally. No edema or erythema noted. Capillary refill within normal limits.  Neurological: Epicritic and protective threshold grossly intact bilaterally.   Musculoskeletal Exam: Pain with palpation noted to the left great toe. Range of motion within normal limits to all pedal and ankle joints bilateral. Muscle strength 5/5 in all groups bilateral.   Assessment: 1. Ulceration of the left great toe secondary to toenail avulsion  2. PVD 3. Capsulitis left great toe 4. Pre-ulcerative callus lesion noted to the right foot x 1   Plan of  Care:  1. Patient evaluated.  2. Medically necessary excisional debridement including subcutaneous tissue was performed using a tissue nipper and a chisel blade. Excisional debridement of all the necrotic nonviable tissue down to healthy bleeding viable tissue was performed with post-debridement measurements same as pre-. 3. The wound was cleansed and dry sterile dressing applied. 4. Prescription for Gentamicin cream provided to patient to use daily with a bandage.  5. Prescription for Vicodin 7.5/ 325 mg provided to patient.  6. Post op shoe dispensed.  7. Return to clinic in 4 weeks.       Edrick Kins, DPM [facility]  Dr. Edrick Kins, DPM    [address], [place] [address]                Office [phone]  Fax [phone]

## 2019-04-22 ENCOUNTER — Encounter: Payer: Self-pay | Admitting: Podiatry

## 2019-04-22 ENCOUNTER — Other Ambulatory Visit: Payer: Self-pay

## 2019-04-22 ENCOUNTER — Ambulatory Visit (INDEPENDENT_AMBULATORY_CARE_PROVIDER_SITE_OTHER): Payer: Medicare Other | Admitting: Podiatry

## 2019-04-22 VITALS — Temp 98.1°F

## 2019-04-22 DIAGNOSIS — G5792 Unspecified mononeuropathy of left lower limb: Secondary | ICD-10-CM

## 2019-04-22 MED ORDER — PREGABALIN 100 MG PO CAPS: 100.0000 mg | ORAL_CAPSULE | Freq: Two times a day (BID) | ORAL | 2 refills | Status: DC

## 2019-04-25 NOTE — Progress Notes (Signed)
   HPI: 83 y.o. female presenting today for follow up evaluation of an ulceration and capsulitis of the left great toe. She reports moderate pain of the toe. She reports having to take two Vicodin daily for treatment. There are no other modifying factors noted.  Patient is here for further evaluation and treatment.   Past Medical History:  Diagnosis Date  . Arthritis    OA LEFT HIP   . Chest pain   . Hypertension   . Muscle spasm   . Noncompliance   . Osteoarthritis    a. s/p R THA  . Pain    BOTH FEET "TIGHTNESS" "NO NERVE PAIN" - TAKES GABAPENTIN  . Tobacco abuse      Physical Exam: General: The patient is alert and oriented x3 in no acute distress.  Dermatology: Wound noted to the left great toe has healed. Complete re-epithelialization has occurred. No drainage noted.  Skin is warm, dry and supple bilateral lower extremities. Negative for open lesions or macerations.  Vascular: Palpable pedal pulses bilaterally. No edema or erythema noted. Capillary refill within normal limits.  Neurological: Epicritic and protective threshold grossly intact bilaterally.   Musculoskeletal Exam: Range of motion within normal limits to all pedal and ankle joints bilateral. Muscle strength 5/5 in all groups bilateral.   Assessment: 1. Ulceration of the left great toe secondary to toenail avulsion - healed  2. H/o PVD 3. Capsulitis left great toe - resolved  4. Neuritis right great toe   Plan of Care:  1. Patient evaluated.  2. Discontinue using post op shoe.  3. Prescription for Lyrica 100 mg BID provided to patient. Patient has side effects with Gabapentin.  4. Continue taking Vicodin 7.5/325 mg as directed by PCP.  5. Return to clinic in 4 weeks.      Edrick Kins, DPM [facility]  Dr. Edrick Kins, DPM    [address],  [address]                Office [phone]  Fax [phone]

## 2019-05-20 ENCOUNTER — Other Ambulatory Visit: Payer: Self-pay

## 2019-05-20 ENCOUNTER — Ambulatory Visit (INDEPENDENT_AMBULATORY_CARE_PROVIDER_SITE_OTHER): Payer: Medicare Other | Admitting: Podiatry

## 2019-05-20 DIAGNOSIS — G5792 Unspecified mononeuropathy of left lower limb: Secondary | ICD-10-CM | POA: Diagnosis not present

## 2019-05-20 DIAGNOSIS — I739 Peripheral vascular disease, unspecified: Secondary | ICD-10-CM | POA: Diagnosis not present

## 2019-05-20 DIAGNOSIS — G629 Polyneuropathy, unspecified: Secondary | ICD-10-CM | POA: Diagnosis not present

## 2019-05-20 MED ORDER — PREGABALIN 100 MG PO CAPS: 100.0000 mg | ORAL_CAPSULE | Freq: Two times a day (BID) | ORAL | 1 refills | Status: DC

## 2019-05-24 NOTE — Progress Notes (Signed)
   HPI: 83 y.o. female presenting today is here for follow up evaluation of pain to the left great toenail. She reports some continued pain of the medial side of the toe. She has been taking Vicodin for pain prescribed by her PCP. She states she never received the prescription for Lyrica. There are no worsening factors noted at this time. Patient is here for further evaluation and treatment.   Past Medical History:  Diagnosis Date  . Arthritis    OA LEFT HIP   . Chest pain   . Hypertension   . Muscle spasm   . Noncompliance   . Osteoarthritis    a. s/p R THA  . Pain    BOTH FEET "TIGHTNESS" "NO NERVE PAIN" - TAKES GABAPENTIN  . Tobacco abuse      Physical Exam: General: The patient is alert and oriented x3 in no acute distress.  Dermatology: Skin is warm, dry and supple bilateral lower extremities. Negative for open lesions or macerations.  Vascular: Palpable pedal pulses bilaterally. No edema or erythema noted. Capillary refill within normal limits.  Neurological: Epicritic and protective threshold diminished bilaterally.   Musculoskeletal Exam: Range of motion within normal limits to all pedal and ankle joints bilateral. Muscle strength 5/5 in all groups bilateral.   Assessment: 1. Peripheral polyneuropathy 2. H/o PVD BLE   Plan of Care:  1. Patient evaluated.   2. Recommended good shoe gear.  3. Prescription for Lyrica 100 mg BID provided to patient. Patient has side effects with Gabapentin. Prescription was sent to wrong pharmacy last visit.  4. Continue taking Vicodin 7.5/325 mg as directed by PCP.  5. Return to clinic as needed.       Edrick Kins, DPM [facility]  Dr. Edrick Kins, DPM    [address], [place] [address]                Office [phone]  Fax [phone]

## 2019-07-19 HISTORY — PX: OTHER SURGICAL HISTORY: SHX169
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
## 2019-08-31 ENCOUNTER — Ambulatory Visit: Payer: Medicare Other

## 2019-09-01 ENCOUNTER — Ambulatory Visit: Payer: Medicare Other | Attending: Family | Admitting: Physical Therapy

## 2019-09-01 ENCOUNTER — Other Ambulatory Visit: Payer: Self-pay

## 2019-09-01 ENCOUNTER — Encounter: Payer: Self-pay | Admitting: Physical Therapy

## 2019-09-01 DIAGNOSIS — M545 Low back pain, unspecified: Secondary | ICD-10-CM

## 2019-09-01 DIAGNOSIS — R2689 Other abnormalities of gait and mobility: Secondary | ICD-10-CM | POA: Diagnosis present

## 2019-09-01 DIAGNOSIS — M542 Cervicalgia: Secondary | ICD-10-CM

## 2019-09-01 DIAGNOSIS — G8929 Other chronic pain: Secondary | ICD-10-CM

## 2019-09-01 DIAGNOSIS — R293 Abnormal posture: Secondary | ICD-10-CM | POA: Diagnosis present
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
## 2019-09-09 ENCOUNTER — Ambulatory Visit: Payer: Medicare Other | Admitting: Physical Therapy

## 2019-09-09 ENCOUNTER — Other Ambulatory Visit: Payer: Self-pay

## 2019-09-09 ENCOUNTER — Encounter: Payer: Self-pay | Admitting: Physical Therapy

## 2019-09-09 DIAGNOSIS — R2689 Other abnormalities of gait and mobility: Secondary | ICD-10-CM

## 2019-09-09 DIAGNOSIS — G8929 Other chronic pain: Secondary | ICD-10-CM

## 2019-09-09 DIAGNOSIS — M542 Cervicalgia: Secondary | ICD-10-CM

## 2019-09-09 DIAGNOSIS — R293 Abnormal posture: Secondary | ICD-10-CM

## 2019-09-09 NOTE — Therapy (Signed)
[place], [place], [address] Phone: [phone]   Fax:  [phone]  Physical Therapy Treatment  Patient Details  Name: SAMELLA LUCCHETTI MRN: [id number] Date of Birth: 1936-08-30 Referring Provider (PT): Sonia Side., FNP    Encounter Date: 09/09/2019  PT End of Session - 09/09/19 0918    Visit Number  2    Number of Visits  13    Date for PT Re-Evaluation  10/13/19    Authorization Type  MCR: Kx mod at 15th visit, progress note at 10th visit.    PT Start Time  [phone]    PT Stop Time  1005    PT Time Calculation (min)  47 min    Activity Tolerance  Patient tolerated treatment well    Behavior During Therapy  WFL for tasks assessed/performed       Past Medical History:  Diagnosis Date  . Arthritis    OA LEFT HIP   . Chest pain   . Hypertension   . Muscle spasm   . Noncompliance   . Osteoarthritis    a. s/p R THA  . Pain    BOTH FEET "TIGHTNESS" "NO NERVE PAIN" - TAKES GABAPENTIN  . Tobacco abuse     Past Surgical History:  Procedure Laterality Date  . BACK SURGERY    . Cataract surgeries Right 07/2019  . JOINT REPLACEMENT  2000   RIGHT TOTAL HIP ARTHROPLASTY  . SURGERY FOR TUBAL PREGNANCY    . TOTAL HIP ARTHROPLASTY Left 07/24/2013   Procedure: LEFT TOTAL HIP ARTHROPLASTY ANTERIOR APPROACH;  Surgeon: Mcarthur Rossetti, MD;  Location: [facility];  Service: Orthopedics;  Laterality: Left;    There were no vitals filed for this visit.  Subjective Assessment - 09/09/19 0919    Subjective  "I was pretty sore after those last exercises, I am still having pain today at a 7/10"    Patient Stated Goals  to be able to do house work, and other activities including lifting / walking    Currently in Pain?  Yes    Pain Score  7     Pain Orientation  Right;Left    Pain Frequency  Intermittent    Aggravating Factors   looking up    Pain Relieving Factors  getting out of the position    Pain  Score  7    Pain Location  Back    Pain Orientation  Right;Left    Pain Descriptors / Indicators  Aching;Sore    Pain Type  Chronic pain    Pain Onset  More than a month ago    Pain Frequency  Intermittent    Aggravating Factors   prolonged standing/ walking    Pain Relieving Factors  sitting and resting         [facility] PT Assessment - 09/09/19 0001      Assessment   Medical Diagnosis  Low back pain, L UE numbness/ tingling,     Referring Provider (PT)  Sonia Side., FNP                    [facility] Adult PT Treatment/Exercise - 09/09/19 0925      Self-Care   Self-Care  Other Self-Care Comments    Other Self-Care Comments   how to perform MTPR to relieve muscle tension and how to perform at hom      Neck Exercises: Supine   Neck Retraction  10  reps;5 secs      Lumbar Exercises: Stretches   Lower Trunk Rotation Limitations  1 x 15 bil    Hip Flexor Stretch  2 reps;Left;Right;30 seconds    Hip Flexor Stretch Limitations  modified to recumbent position and light stretch  due to increased R hip pain      Lumbar Exercises: Aerobic   Nustep  L5 x 5 min UE/LE      Lumbar Exercises: Supine   Glut Set  10 reps;5 seconds   bil in semi-recumbent     Manual Therapy   Manual Therapy  Joint mobilization    Manual therapy comments  MTPR along bil hip flexors x 2   with pt in semi-recumbent positoin   Joint Mobilization  grade II LAD bil              PT Education - 09/09/19 0957    Education Details  reviewed previously provided HEP and how to perform MTPR techniques    Person(s) Educated  Patient    Methods  Explanation;Verbal cues;Handout    Comprehension  Verbalized understanding;Verbal cues required       PT Short Term Goals - 09/01/19 1215      PT SHORT TERM GOAL #1   Title  pt to be I with inital HEP    Time  3    Period  Weeks    Status  New    Target Date  09/22/19      PT SHORT TERM GOAL #2   Title  pt to verbalize and demo proper posture  and lifting/ gait mechanics to reduce and prevent neck/ low back pain    Time  3    Period  Weeks    Status  New    Target Date  09/22/19        PT Long Term Goals - 09/01/19 1216      PT LONG TERM GOAL #1   Title  increase bil cervical sidebending/ rotation by >/=8 degrees to promote functional cervical mobility required for ADLs with </= 2/10 pain    Time  6    Period  Weeks    Status  New    Target Date  10/13/19      PT LONG TERM GOAL #2   Title  pt to report decreased lumbar paraspinal spasm to reduce pain to </= 2/10    Time  6    Period  Weeks    Status  New    Target Date  10/13/19      PT LONG TERM GOAL #3   Title  increase bil hip strength to >/= 4+/5 to promote good posture with lifting and efficient gait mechanics maximizing safety    Time  6    Period  Weeks    Status  New    Target Date  10/13/19      PT LONG TERM GOAL #4   Title  pt to be able to stand and walk for >/= 30 min for community ambulation and ADLs and for pt's personal goals of returning to regular activity    Time  6    Period  Weeks    Status  New    Target Date  10/13/19      PT LONG TERM GOAL #5   Title  increase FOTO score to </= 53% limited to demo improvement in function    Time  6    Period  Weeks    Status  New    Target Date  10/13/19            Plan - 09/09/19 0956    Clinical Impression Statement  pt reports consistency with her HEP but did reporting increased soreness likely related from DOMS. she did report no UE referred symptoms noting the exercises have helped with that. Continued working on hip flexor stretching and STW followed with posterior chain strengthening to promote proper standing positioning with walking. pt continued to report 7/10 pain in the back.    Personal Factors and Comorbidities  Comorbidity 2;Age;Education    PT Treatment/Interventions  ADLs/Self Care Home Management;Cryotherapy;Electrical Stimulation;Iontophoresis 4mg /ml Dexamethasone;Moist  Heat;Ultrasound;Therapeutic activities;Therapeutic exercise;Balance training;Neuromuscular re-education;Manual techniques;Compression bandaging;Taping;Dry needling;Patient/family education    PT Next Visit Plan  review/ update HEP, STW along L upper trap, bil lumbar paraspinals,  hip strengthening, work on hip flexor stretching and trunk extensors to address forward lean with standing/ walking.    PT Home Exercise Plan  [id number] - upper trap stretch, chin tuck, lower trunk rotation, sidelying hip abduction, standing hip flexor stretching       Patient will benefit from skilled therapeutic intervention in order to improve the following deficits and impairments:  Pain, Obesity, Postural dysfunction, Decreased strength, Increased muscle spasms, Improper body mechanics, Abnormal gait, Decreased activity tolerance, Decreased balance, Decreased range of motion, Decreased endurance, Decreased mobility  Visit Diagnosis: Cervicalgia  Chronic bilateral low back pain without sciatica  Abnormal posture  Other abnormalities of gait and mobility     Problem List Patient Active Problem List   Diagnosis Date Noted  . Dry eyes 03/25/2019  . Atherosclerosis of native arteries of extremity with intermittent claudication (HCC) 11/13/2017  . PAD (peripheral artery disease) (HCC) 07/26/2015  . Pain in joint, lower leg 08/02/2014  . Stiffness of joint, not elsewhere classified, pelvic region and thigh 04/30/2014  . Muscle weakness (generalized) 04/30/2014  . Degenerative arthritis of left hip 07/17/2013  . HTN (hypertension) 02/05/2013  . Chest pain, atypical 02/05/2013  . Anxiety 02/05/2013  . Nicotine abuse 02/05/2013   02/07/2013 PT, DPT, LAT, ATC  09/09/19  10:11 AM      [facility] [address] [place], [place], [place] Phone: [phone]   Fax:  [phone]  Name: KANITRA PURIFOY MRN: Lynnea Ferrier Date of Birth: 1936/04/16

## 2019-09-15 ENCOUNTER — Ambulatory Visit: Payer: Medicare Other | Attending: Internal Medicine

## 2019-09-15 ENCOUNTER — Ambulatory Visit: Payer: Medicare Other | Admitting: Physical Therapy

## 2019-09-15 ENCOUNTER — Other Ambulatory Visit: Payer: Self-pay

## 2019-09-15 DIAGNOSIS — Z20822 Contact with and (suspected) exposure to covid-19: Secondary | ICD-10-CM

## 2019-09-16 LAB — NOVEL CORONAVIRUS, NAA: SARS-CoV-2, NAA: DETECTED — AB

## 2019-09-17 ENCOUNTER — Telehealth: Payer: Self-pay | Admitting: *Deleted

## 2019-09-17 ENCOUNTER — Telehealth: Payer: Self-pay | Admitting: Infectious Diseases

## 2019-09-17 ENCOUNTER — Ambulatory Visit: Payer: Medicare Other | Admitting: Physical Therapy

## 2019-09-17 NOTE — Telephone Encounter (Signed)
Called to discuss with patient about Covid symptoms and the use of bamlanivimab, a monoclonal antibody infusion for those with mild to moderate Covid symptoms and at a high risk of hospitalization.  Pt is qualified for this infusion at the [facility] infusion center due to Age > 65   Sx started 12/29 with fatigue, congestion, chills. She was put on an antibiotic by her primary care doctor which she has not yet taken. I explained that COVID is a virus and will not improve with an antibiotic only time and supportive care. She is a candidate for mAb infusion but was not clear on the purpose - She put me on the phone with her son as she was not wanting to make a decision without his thoughts. After explaining they would like to consult with their doctor more and get back to me with a decision.

## 2019-09-17 NOTE — Telephone Encounter (Signed)
See Results note regarding positive Covid 19.

## 2019-09-18 ENCOUNTER — Emergency Department (HOSPITAL_COMMUNITY): Payer: Medicare Other

## 2019-09-18 ENCOUNTER — Emergency Department (HOSPITAL_COMMUNITY)
Admission: EM | Admit: 2019-09-18 | Discharge: 2019-09-18 | Disposition: A | Discharge status: Home / Self Care | Payer: Medicare Other | Attending: Emergency Medicine | Admitting: Emergency Medicine

## 2019-09-18 ENCOUNTER — Encounter (HOSPITAL_COMMUNITY): Payer: Self-pay | Admitting: *Deleted

## 2019-09-18 ENCOUNTER — Other Ambulatory Visit: Payer: Self-pay

## 2019-09-18 DIAGNOSIS — R0602 Shortness of breath: Secondary | ICD-10-CM | POA: Diagnosis present

## 2019-09-18 DIAGNOSIS — I10 Essential (primary) hypertension: Secondary | ICD-10-CM | POA: Diagnosis not present

## 2019-09-18 DIAGNOSIS — U071 COVID-19: Secondary | ICD-10-CM | POA: Diagnosis not present

## 2019-09-18 DIAGNOSIS — M6281 Muscle weakness (generalized): Secondary | ICD-10-CM | POA: Insufficient documentation

## 2019-09-18 DIAGNOSIS — Z87891 Personal history of nicotine dependence: Secondary | ICD-10-CM | POA: Diagnosis not present

## 2019-09-18 LAB — URINALYSIS, ROUTINE W REFLEX MICROSCOPIC
Bacteria, UA: NONE SEEN
Bilirubin Urine: NEGATIVE
Glucose, UA: NEGATIVE mg/dL
Hgb urine dipstick: NEGATIVE
Ketones, ur: NEGATIVE mg/dL
Nitrite: NEGATIVE
Protein, ur: NEGATIVE mg/dL
Specific Gravity, Urine: 1.023 (ref 1.005–1.030)
pH: 5 (ref 5.0–8.0)

## 2019-09-18 LAB — CBC
HCT: 39.5 % (ref 36.0–46.0)
Hemoglobin: 12.1 g/dL (ref 12.0–15.0)
MCH: 28.5 pg (ref 26.0–34.0)
MCHC: 30.6 g/dL (ref 30.0–36.0)
MCV: 92.9 fL (ref 80.0–100.0)
Platelets: 189 10*3/uL (ref 150–400)
RBC: 4.25 MIL/uL (ref 3.87–5.11)
RDW: 15.9 % — ABNORMAL HIGH (ref 11.5–15.5)
WBC: 5.6 10*3/uL (ref 4.0–10.5)
nRBC: 0 % (ref 0.0–0.2)

## 2019-09-18 LAB — COMPREHENSIVE METABOLIC PANEL
ALT: 25 U/L (ref 0–44)
AST: 53 U/L — ABNORMAL HIGH (ref 15–41)
Albumin: 3.5 g/dL (ref 3.5–5.0)
Alkaline Phosphatase: 57 U/L (ref 38–126)
Anion gap: 10 (ref 5–15)
BUN: 21 mg/dL (ref 8–23)
CO2: 22 mmol/L (ref 22–32)
Calcium: 8.2 mg/dL — ABNORMAL LOW (ref 8.9–10.3)
Chloride: 106 mmol/L (ref 98–111)
Creatinine, Ser: 1.1 mg/dL — ABNORMAL HIGH (ref 0.44–1.00)
GFR calc Af Amer: 55 mL/min — ABNORMAL LOW (ref >60)
GFR calc non Af Amer: 47 mL/min — ABNORMAL LOW (ref >60)
Glucose, Bld: 101 mg/dL — ABNORMAL HIGH (ref 70–99)
Potassium: 3.9 mmol/L (ref 3.5–5.1)
Sodium: 138 mmol/L (ref 135–145)
Total Bilirubin: 0.4 mg/dL (ref 0.3–1.2)
Total Protein: 7.8 g/dL (ref 6.5–8.1)

## 2019-09-18 NOTE — ED Notes (Signed)
Pt ambulated by LaT, NT   Pt out of bed with initial sats dipping to 89-90 then immediate rebound to 94-95 per cent on room air

## 2019-09-18 NOTE — ED Provider Notes (Signed)
[facility]-EMERGENCY DEPT [facility] Emergency Department Provider Note MRN:  [id number]  Arrival date & time: 09/18/19     Chief Complaint   Shortness of Breath   History of Present Illness   Kiara Little is a 84 y.o. year-old female with a history of hypertension presenting to the ED with chief complaint of shortness of breath.  Diagnosed with coronavirus on 12-29, is endorsing generalized weakness and dyspnea on exertion for the past 2 days.  Advised by PCP to be evaluated here in the emergency department.  Patient denies chest pain, no fever, no cough, no abdominal pain, no vomiting, no diarrhea.  Symptoms are mild to moderate, no other exacerbating or alleviating factors.  Review of Systems  A complete 10 system review of systems was obtained and all systems are negative except as noted in the HPI and PMH.   Patient's Health History    Past Medical History:  Diagnosis Date   Arthritis    OA LEFT HIP    Chest pain    Hypertension    Muscle spasm    Noncompliance    Osteoarthritis    a. s/p R THA   Pain    BOTH FEET "TIGHTNESS" "NO NERVE PAIN" - TAKES GABAPENTIN   Tobacco abuse     Past Surgical History:  Procedure Laterality Date   BACK SURGERY     Cataract surgeries Right 07/2019   JOINT REPLACEMENT  2000   RIGHT TOTAL HIP ARTHROPLASTY   SURGERY FOR TUBAL PREGNANCY     TOTAL HIP ARTHROPLASTY Left 07/24/2013   Procedure: LEFT TOTAL HIP ARTHROPLASTY ANTERIOR APPROACH;  Surgeon: Kathryne Hitch, MD;  Location: [facility];  Service: Orthopedics;  Laterality: Left;    Family History  Problem Relation Age of Onset   Heart attack Mother        died @ 96   Diabetes Father        died in his 32's   Cancer Brother        deceased    Social History   Socioeconomic History   Marital status: Legally Separated    Spouse name: Not on file   Number of children: Not on file   Years of education: Not on file   Highest education level: Not on  file  Occupational History   Not on file  Tobacco Use   Smoking status: Former Smoker    Packs/day: 0.50    Years: 40.00    Pack years: 20.00    Types: Cigarettes    Quit date: 07/24/2013    Years since quitting: 6.1   Smokeless tobacco: Never Used  Substance and Sexual Activity   Alcohol use: No   Drug use: No   Sexual activity: Yes    Birth control/protection: None  Other Topics Concern   Not on file  Social History Narrative   Pt lives in [place] with her son.  She is retired.  She does not exercise.   Social Determinants of Health   Financial Resource Strain:    Difficulty of Paying Living Expenses: Not on file  Food Insecurity:    Worried About Programme researcher, broadcasting/film/video in the Last Year: Not on file   [employer] of Food in the Last Year: Not on file  Transportation Needs:    Lack of Transportation (Medical): Not on file   Lack of Transportation (Non-Medical): Not on file  Physical Activity:    Days of Exercise per Week: Not on file  Minutes of Exercise per Session: Not on file  Stress:    Feeling of Stress : Not on file  Social Connections:    Frequency of Communication with Friends and Family: Not on file   Frequency of Social Gatherings with Friends and Family: Not on file   Attends Religious Services: Not on file   Active Member of Clubs or Organizations: Not on file   Attends Archivist Meetings: Not on file   Marital Status: Not on file  Intimate Partner Violence:    Fear of Current or Ex-Partner: Not on file   Emotionally Abused: Not on file   Physically Abused: Not on file   Sexually Abused: Not on file     Physical Exam  Vital Signs and Nursing Notes reviewed Vitals:   09/18/19 1817 09/18/19 1830  BP:  139/88  Pulse: 69 63  Resp: 18 (!) 23  Temp:    SpO2: 95% 93%    CONSTITUTIONAL: Well-appearing, NAD NEURO:  Alert and oriented x 3, no focal deficits EYES:  eyes equal and reactive ENT/NECK:  no LAD, no  JVD CARDIO: Regular rate, well-perfused, normal S1 and S2 PULM:  CTAB no wheezing or rhonchi GI/GU:  normal bowel sounds, non-distended, non-tender MSK/SPINE:  No gross deformities, no edema SKIN:  no rash, atraumatic PSYCH:  Appropriate speech and behavior  Diagnostic and Interventional Summary    EKG Interpretation  Date/Time:    Ventricular Rate:    PR Interval:    QRS Duration:   QT Interval:    QTC Calculation:   R Axis:     Text Interpretation:        Labs Reviewed  CBC - Abnormal; Notable for the following components:      Result Value   RDW 15.9 (*)    All other components within normal limits  COMPREHENSIVE METABOLIC PANEL - Abnormal; Notable for the following components:   Glucose, Bld 101 (*)    Creatinine, Ser 1.10 (*)    Calcium 8.2 (*)    AST 53 (*)    GFR calc non Af Amer 47 (*)    GFR calc Af Amer 55 (*)    All other components within normal limits  URINALYSIS, ROUTINE W REFLEX MICROSCOPIC - Abnormal; Notable for the following components:   Leukocytes,Ua TRACE (*)    All other components within normal limits    XR Chest Single View  Final Result      Medications - No data to display   Procedures  /  Critical Care Procedures  ED Course and Medical Decision Making  I have reviewed the triage vital signs and the nursing notes.  Pertinent labs & imaging results that were available during my care of the patient were reviewed by me and considered in my medical decision making (see below for details).     Well-appearing, normal vital signs, initially triaged as 91% on room air but on my evaluation is 96%, no increased work of breathing, clear lungs, no tachypnea, symptoms likely related to COVID-19, no evidence of DVT, nothing to suggest pulmonary embolism.  Awaiting basic labs, x-ray, urinalysis, anticipating discharge.  Work-up largely reassuring, COVID-19 pattern on x-ray.  Patient ambulated and did very well, she denies having any shortness of  breath or symptoms during ambulation.  Maintain saturations well.  No indication for admission at this time.  Patient has an infusion of antibodies scheduled with her regular doctors.  Appropriate for discharge.  Kiara Little was evaluated in  Emergency Department on 09/18/2019 for the symptoms described in the history of present illness. She was evaluated in the context of the global COVID-19 pandemic, which necessitated consideration that the patient might be at risk for infection with the SARS-CoV-2 virus that causes COVID-19. Institutional protocols and algorithms that pertain to the evaluation of patients at risk for COVID-19 are in a state of rapid change based on information released by regulatory bodies including the CDC and federal and state organizations. These policies and algorithms were followed during the patient's care in the ED.   Elmer Sow. Pilar Plate, MD [facility] Emergency Medicine [facility] [email or web address]  Final Clinical Impressions(s) / ED Diagnoses     ICD-10-CM   1. COVID-19  U07.1   2. SOB (shortness of breath)  R06.02 XR Chest Single View    XR Chest Single View    ED Discharge Orders    None       Discharge Instructions Discussed with and Provided to Patient:     Discharge Instructions     You were evaluated in the Emergency Department and after careful evaluation, we did not find any emergent condition requiring admission or further testing in the hospital.  Your exam/testing today is overall reassuring.  Your symptoms seem to be due to COVID-19 infection.  Please follow-up with your regular doctors to set up the infusion.  Please return to the Emergency Department if you experience any worsening of your condition.  We encourage you to follow up with a primary care provider.  Thank you for allowing [place] to be a part of your care.       Sabas Sous, MD 09/18/19 [phone]

## 2019-09-18 NOTE — ED Triage Notes (Signed)
Diagnosed covid positive on the 29th, c/o increased shortness of breath

## 2019-09-18 NOTE — Discharge Instructions (Addendum)
You were evaluated in the Emergency Department and after careful evaluation, we did not find any emergent condition requiring admission or further testing in the hospital.  Your exam/testing today is overall reassuring.  Your symptoms seem to be due to COVID-19 infection.  Please follow-up with your regular doctors to set up the infusion.  Please return to the Emergency Department if you experience any worsening of your condition.  We encourage you to follow up with a primary care provider.  Thank you for allowing [place] to be a part of your care.

## 2019-09-20 ENCOUNTER — Telehealth: Payer: Self-pay | Admitting: Unknown Physician Specialty

## 2019-09-20 NOTE — Telephone Encounter (Signed)
  I connected by phone with Kiara Little on 09/20/2019 at 10:21 AM to discuss the potential use of an new treatment for mild to moderate COVID-19 viral infection in non-hospitalized patients.  This patient is a 84 y.o. female that meets the FDA criteria for Emergency Use Authorization of bamlanivimab or casirivimab\imdevimab.  Has a (+) direct SARS-CoV-2 viral test result  Has mild or moderate COVID-19   Is ? 84 years of age and weighs ? 40 kg  Is NOT hospitalized due to COVID-19  Is NOT requiring oxygen therapy or requiring an increase in baseline oxygen flow rate due to COVID-19  Is within 10 days of symptom onset  Has at least one of the high risk factor(s) for progression to severe COVID-19 and/or hospitalization as defined in EUA.  Specific high risk criteria : >/= 82 yo, diabetes and hypertension   I have spoken and communicated the following to the patient or parent/caregiver:  1. FDA has authorized the emergency use of bamlanivimab and casirivimab\imdevimab for the treatment of mild to moderate COVID-19 in adults and pediatric patients with positive results of direct SARS-CoV-2 viral testing who are 76 years of age and older weighing at least 40 kg, and who are at high risk for progressing to severe COVID-19 and/or hospitalization.  2. The significant known and potential risks and benefits of bamlanivimab and casirivimab\imdevimab, and the extent to which such potential risks and benefits are unknown.  3. Information on available alternative treatments and the risks and benefits of those alternatives, including clinical trials.  4. Patients treated with bamlanivimab and casirivimab\imdevimab should continue to self-isolate and use infection control measures (e.g., wear mask, isolate, social distance, avoid sharing personal items, clean and disinfect "high touch" surfaces, and frequent handwashing) according to CDC guidelines.   5. The patient or parent/caregiver has the option  to accept or refuse bamlanivimab or casirivimab\imdevimab .  After reviewing this information with the patient, pt would like me to talk to her daughter.  Daughter feels she is not a good candidate due to unable to sit for a period of time.  Infusion declined Kiara Little 09/20/2019 10:21 AM  Symptom onset 12/31

## 2019-09-22 ENCOUNTER — Ambulatory Visit: Payer: Medicare Other | Admitting: Physical Therapy

## 2019-09-24 ENCOUNTER — Ambulatory Visit: Payer: Medicare Other | Admitting: Physical Therapy

## 2019-09-29 ENCOUNTER — Ambulatory Visit: Payer: Medicare Other | Admitting: Physical Therapy

## 2019-10-01 ENCOUNTER — Ambulatory Visit: Payer: Medicare Other | Admitting: Physical Therapy

## 2019-10-19 ENCOUNTER — Telehealth: Payer: Self-pay

## 2019-10-19 ENCOUNTER — Ambulatory Visit: Payer: Medicare Other | Attending: Family | Admitting: Physical Therapy

## 2019-10-19 DIAGNOSIS — R293 Abnormal posture: Secondary | ICD-10-CM | POA: Insufficient documentation

## 2019-10-19 DIAGNOSIS — M542 Cervicalgia: Secondary | ICD-10-CM | POA: Insufficient documentation

## 2019-10-19 DIAGNOSIS — G8929 Other chronic pain: Secondary | ICD-10-CM | POA: Insufficient documentation

## 2019-10-19 DIAGNOSIS — M545 Low back pain: Secondary | ICD-10-CM | POA: Insufficient documentation

## 2019-10-19 DIAGNOSIS — R2689 Other abnormalities of gait and mobility: Secondary | ICD-10-CM | POA: Insufficient documentation

## 2019-10-19 NOTE — Telephone Encounter (Signed)
Referral notes sent from [facility] [phone]  Notes sent to scheduling

## 2019-10-22 ENCOUNTER — Other Ambulatory Visit: Payer: Self-pay

## 2019-10-22 ENCOUNTER — Ambulatory Visit: Payer: Medicare Other | Admitting: Physical Therapy

## 2019-10-22 ENCOUNTER — Encounter: Payer: Self-pay | Admitting: Physical Therapy

## 2019-10-22 DIAGNOSIS — R2689 Other abnormalities of gait and mobility: Secondary | ICD-10-CM

## 2019-10-22 DIAGNOSIS — M545 Low back pain: Secondary | ICD-10-CM | POA: Diagnosis present

## 2019-10-22 DIAGNOSIS — G8929 Other chronic pain: Secondary | ICD-10-CM

## 2019-10-22 DIAGNOSIS — M542 Cervicalgia: Secondary | ICD-10-CM

## 2019-10-22 DIAGNOSIS — R293 Abnormal posture: Secondary | ICD-10-CM | POA: Diagnosis present

## 2019-10-22 NOTE — Therapy (Signed)
[facility] [address] [place], [place], [address] Phone: [phone]   Fax:  [phone]  Physical Therapy Treatment / Re-certification  Patient Details  Name: Kiara Little MRN: [id number] Date of Birth: 1936-04-17 Referring Provider (PT): Raymon Mutton., FNP    Encounter Date: 10/22/2019  PT End of Session - 10/22/19 1246    Visit Number  3    Number of Visits  13    Date for PT Re-Evaluation  12/03/19    Authorization Type  MCR: Kx mod at 15th visit, progress note at 10th visit.    PT Start Time  1230    PT Stop Time  1315    PT Time Calculation (min)  45 min       Past Medical History:  Diagnosis Date  . Arthritis    OA LEFT HIP   . Chest pain   . Hypertension   . Muscle spasm   . Noncompliance   . Osteoarthritis    a. s/p R THA  . Pain    BOTH FEET "TIGHTNESS" "NO NERVE PAIN" - TAKES GABAPENTIN  . Tobacco abuse     Past Surgical History:  Procedure Laterality Date  . BACK SURGERY    . Cataract surgeries Right 07/2019  . JOINT REPLACEMENT  2000   RIGHT TOTAL HIP ARTHROPLASTY  . SURGERY FOR TUBAL PREGNANCY    . TOTAL HIP ARTHROPLASTY Left 07/24/2013   Procedure: LEFT TOTAL HIP ARTHROPLASTY ANTERIOR APPROACH;  Surgeon: Kathryne Hitch, MD;  Location: [facility];  Service: Orthopedics;  Laterality: Left;    There were no vitals filed for this visit.  Subjective Assessment - 10/22/19 1241    Subjective  I could not come for awhile because I had COVID 19    Patient Stated Goals  to be able to do house work, and other activities including lifting / walking    Pain Score  0-No pain   up to 5/10 with moving head   Pain Location  Neck    Pain Orientation  Lower    Pain Descriptors / Indicators  Aching;Tightness    Pain Type  Chronic pain    Pain Radiating Towards  intermittent itching in elbow left    Aggravating Factors   looking up and right    Pain Relieving Factors  resting    Pain Score  0    up to 10/10 with activity on feet   Pain Location  Back    Pain Orientation  Lower    Pain Descriptors / Indicators  Aching;Tightness    Pain Type  Chronic pain    Aggravating Factors   activity on feet    Pain Relieving Factors  sitting resting                       [facility] Adult PT Treatment/Exercise - 10/22/19 0001      Neck Exercises: Supine   Neck Retraction  10 reps;5 secs      Lumbar Exercises: Stretches   Lower Trunk Rotation Limitations  1 x 15 bil    Hip Flexor Stretch  2 reps;Left;Right;30 seconds    Hip Flexor Stretch Limitations  standing , increased LBP      Lumbar Exercises: Aerobic   Nustep  L3 x 5 min UE/LE               PT Short Term Goals - 10/22/19 1255      PT  SHORT TERM GOAL #1   Title  pt to be I with inital HEP    Baseline  Lost her HEP, reprinted today    Time  3    Period  Weeks    Status  On-going      PT SHORT TERM GOAL #2   Title  pt to verbalize and demo proper posture and lifting/ gait mechanics to reduce and prevent neck/ low back pain    Time  3    Period  Weeks    Status  On-going        PT Long Term Goals - 09/01/19 1216      PT LONG TERM GOAL #1   Title  increase bil cervical sidebending/ rotation by >/=8 degrees to promote functional cervical mobility required for ADLs with </= 2/10 pain    Time  6    Period  Weeks    Status  New    Target Date  10/13/19      PT LONG TERM GOAL #2   Title  pt to report decreased lumbar paraspinal spasm to reduce pain to </= 2/10    Time  6    Period  Weeks    Status  New    Target Date  10/13/19      PT LONG TERM GOAL #3   Title  increase bil hip strength to >/= 4+/5 to promote good posture with lifting and efficient gait mechanics maximizing safety    Time  6    Period  Weeks    Status  New    Target Date  10/13/19      PT LONG TERM GOAL #4   Title  pt to be able to stand and walk for >/= 30 min for community ambulation and ADLs and for pt's personal goals  of returning to regular activity    Time  6    Period  Weeks    Status  New    Target Date  10/13/19      PT LONG TERM GOAL #5   Title  increase FOTO score to </= 53% limited to demo improvement in function    Time  6    Period  Weeks    Status  New    Target Date  10/13/19            Plan - 10/22/19 1335    Clinical Impression Statement  Pt arrives after extended absence from PT due to COVID- positive test, plus quarantine. SHe reports no change in pain. She has high levels of pain especially with standing/walking activity and neck motions. She lost her initial HEP. She has been moving neck actively. Time spent reviewing HEP. Much difficulty with hip flexor stretch as it recreates her LBP. She would benefit from additional PT to address defits and increase function with decreased pain.    Comorbidities  mulitple body parts, hx or arthritis, HTN    Examination-Activity Limitations  Locomotion Level    PT Frequency  2x / week    PT Duration  6 weeks    PT Treatment/Interventions  ADLs/Self Care Home Management;Cryotherapy;Electrical Stimulation;Iontophoresis 4mg /ml Dexamethasone;Moist Heat;Ultrasound;Therapeutic activities;Therapeutic exercise;Balance training;Neuromuscular re-education;Manual techniques;Compression bandaging;Taping;Dry needling;Patient/family education    PT Next Visit Plan  review/ update HEP, STW along L upper trap, bil lumbar paraspinals,  hip strengthening, work on hip flexor stretching and trunk extensors to address forward lean with standing/ walking.    PT Home Exercise Plan  [id number] - upper trap stretch,  chin tuck, lower trunk rotation, sidelying hip abduction, standing hip flexor stretching       Patient will benefit from skilled therapeutic intervention in order to improve the following deficits and impairments:  Pain, Obesity, Postural dysfunction, Decreased strength, Increased muscle spasms, Improper body mechanics, Abnormal gait, Decreased activity  tolerance, Decreased balance, Decreased range of motion, Decreased endurance, Decreased mobility  Visit Diagnosis: Cervicalgia  Chronic bilateral low back pain without sciatica  Abnormal posture  Other abnormalities of gait and mobility     Problem List Patient Active Problem List   Diagnosis Date Noted  . Dry eyes 03/25/2019  . Atherosclerosis of native arteries of extremity with intermittent claudication (HCC) 11/13/2017  . PAD (peripheral artery disease) (HCC) 07/26/2015  . Pain in joint, lower leg 08/02/2014  . Stiffness of joint, not elsewhere classified, pelvic region and thigh 04/30/2014  . Muscle weakness (generalized) 04/30/2014  . Degenerative arthritis of left hip 07/17/2013  . HTN (hypertension) 02/05/2013  . Chest pain, atypical 02/05/2013  . Anxiety 02/05/2013  . Nicotine abuse 02/05/2013    Sherrie Mustache , PTA 10/22/2019, 1:42 PM  [facility] [address] [place], [place], [address] Phone: [phone]   Fax:  [phone]  Name: Kiara Little MRN: [id number] Date of Birth: Jul 24, 1936     Lulu Riding PT, DPT, LAT, ATC  10/22/19  1:49 PM

## 2019-11-02 ENCOUNTER — Ambulatory Visit: Payer: Medicare Other | Admitting: Physical Therapy

## 2019-11-04 ENCOUNTER — Encounter: Payer: Self-pay | Admitting: Physical Therapy

## 2019-11-04 ENCOUNTER — Ambulatory Visit: Payer: Medicare Other | Admitting: Orthotics

## 2019-11-04 ENCOUNTER — Ambulatory Visit: Payer: Medicare Other | Admitting: Physical Therapy

## 2019-11-04 ENCOUNTER — Other Ambulatory Visit: Payer: Self-pay

## 2019-11-04 DIAGNOSIS — M542 Cervicalgia: Secondary | ICD-10-CM

## 2019-11-04 DIAGNOSIS — G8929 Other chronic pain: Secondary | ICD-10-CM

## 2019-11-04 DIAGNOSIS — R293 Abnormal posture: Secondary | ICD-10-CM

## 2019-11-04 DIAGNOSIS — R2689 Other abnormalities of gait and mobility: Secondary | ICD-10-CM

## 2019-11-04 NOTE — Therapy (Signed)
[place], [place], [address] Phone: [phone]   Fax:  [phone]  Physical Therapy Treatment  Patient Details  Name: Kiara Little MRN: [id number] Date of Birth: 1936-02-14 Referring Provider (PT): Sonia Side., FNP    Encounter Date: 11/04/2019  PT End of Session - 11/04/19 0846    Visit Number  4    Number of Visits  13    Date for PT Re-Evaluation  12/03/19    Authorization Type  MCR: Kx mod at 15th visit, progress note at 10th visit.    PT Start Time  [phone]    PT Stop Time  0925    PT Time Calculation (min)  39 min    Activity Tolerance  Patient tolerated treatment well    Behavior During Therapy  [facility] for tasks assessed/performed       Past Medical History:  Diagnosis Date  . Arthritis    OA LEFT HIP   . Chest pain   . Hypertension   . Muscle spasm   . Noncompliance   . Osteoarthritis    a. s/p R THA  . Pain    BOTH FEET "TIGHTNESS" "NO NERVE PAIN" - TAKES GABAPENTIN  . Tobacco abuse     Past Surgical History:  Procedure Laterality Date  . BACK SURGERY    . Cataract surgeries Right 07/2019  . JOINT REPLACEMENT  2000   RIGHT TOTAL HIP ARTHROPLASTY  . SURGERY FOR TUBAL PREGNANCY    . TOTAL HIP ARTHROPLASTY Left 07/24/2013   Procedure: LEFT TOTAL HIP ARTHROPLASTY ANTERIOR APPROACH;  Surgeon: Mcarthur Rossetti, MD;  Location: [facility];  Service: Orthopedics;  Laterality: Left;    There were no vitals filed for this visit.  Subjective Assessment - 11/04/19 0848    Subjective  "some days it is worse then others, worse in the neck then back but todya the back is an 8/10"    Patient Stated Goals  to be able to do house work, and other activities including lifting / walking    Currently in Pain?  Yes    Pain Score  7     Pain Location  Neck    Pain Orientation  Mid;Right;Left    Pain Descriptors / Indicators  Aching;Tightness    Pain Type  Chronic pain    Pain Onset  More  than a month ago    Pain Frequency  Intermittent    Aggravating Factors   looking in up    Pain Relieving Factors  resting    Pain Score  8    Pain Location  Back    Pain Orientation  Lower    Pain Type  Chronic pain    Pain Onset  More than a month ago    Pain Frequency  Intermittent    Aggravating Factors   sitting    Pain Relieving Factors  resting                       [facility] Adult PT Treatment/Exercise - 11/04/19 0001      Neck Exercises: Seated   Other Seated Exercise  scapular retraction 2 x 10 with red theraband    while seated on dynadisc with anterior pelvic tilt     Neck Exercises: Supine   Neck Retraction  15 reps;5 secs   tactile cues for proper form     Lumbar Exercises: Stretches   Lower Trunk Rotation Limitations  2 x 10 with legs     Hip Flexor Stretch  2 reps;30 seconds      Lumbar Exercises: Aerobic   Nustep  L5 x 5 min UE/LE       Lumbar Exercises: Supine   Ab Set  10 reps   seated on dynadisc   Pelvic Tilt  10 reps   anterior pelvic tilt seated on dyna-disc   Bridge  5 reps   halted due to pt cramping in the hamstring     Manual Therapy   Manual therapy comments  MTPR along R upper trap/ levator scapuale     Joint Mobilization  grade II LAD bil                PT Short Term Goals - 10/22/19 1255      PT SHORT TERM GOAL #1   Title  pt to be I with inital HEP    Baseline  Lost her HEP, reprinted today    Time  3    Period  Weeks    Status  On-going      PT SHORT TERM GOAL #2   Title  pt to verbalize and demo proper posture and lifting/ gait mechanics to reduce and prevent neck/ low back pain    Time  3    Period  Weeks    Status  On-going        PT Long Term Goals - 09/01/19 1216      PT LONG TERM GOAL #1   Title  increase bil cervical sidebending/ rotation by >/=8 degrees to promote functional cervical mobility required for ADLs with </= 2/10 pain    Time  6    Period  Weeks    Status  New    Target Date   10/13/19      PT LONG TERM GOAL #2   Title  pt to report decreased lumbar paraspinal spasm to reduce pain to </= 2/10    Time  6    Period  Weeks    Status  New    Target Date  10/13/19      PT LONG TERM GOAL #3   Title  increase bil hip strength to >/= 4+/5 to promote good posture with lifting and efficient gait mechanics maximizing safety    Time  6    Period  Weeks    Status  New    Target Date  10/13/19      PT LONG TERM GOAL #4   Title  pt to be able to stand and walk for >/= 30 min for community ambulation and ADLs and for pt's personal goals of returning to regular activity    Time  6    Period  Weeks    Status  New    Target Date  10/13/19      PT LONG TERM GOAL #5   Title  increase FOTO score to </= 53% limited to demo improvement in function    Time  6    Period  Weeks    Status  New    Target Date  10/13/19            Plan - 11/04/19 0926    Clinical Impression Statement  Pt reports fluctuating neck/ back pain but does have some inconsistency with reproting neck/ back pain. contiued STW focusing on the R upper trap and sub-occipitals, utizling decompression position to reduce back pain. Utilized dyna disc to work on incorporating  core strengthening and proper posture in sitting. she noted relief in both the neck and back, and provided information on where she can find a dyna disc.    PT Next Visit Plan  review/ update HEP, STW along L upper trap, bil lumbar paraspinals,  hip strengthening, work on hip flexor stretching and trunk extensors to address forward lean with standing/ walking. posture education    PT Home Exercise Plan  [id number] - upper trap stretch, chin tuck, lower trunk rotation, sidelying hip abduction, standing hip flexor stretching       Patient will benefit from skilled therapeutic intervention in order to improve the following deficits and impairments:     Visit Diagnosis: Cervicalgia  Chronic bilateral low back pain without  sciatica  Abnormal posture  Other abnormalities of gait and mobility     Problem List Patient Active Problem List   Diagnosis Date Noted  . Dry eyes 03/25/2019  . Atherosclerosis of native arteries of extremity with intermittent claudication (HCC) 11/13/2017  . PAD (peripheral artery disease) (HCC) 07/26/2015  . Pain in joint, lower leg 08/02/2014  . Stiffness of joint, not elsewhere classified, pelvic region and thigh 04/30/2014  . Muscle weakness (generalized) 04/30/2014  . Degenerative arthritis of left hip 07/17/2013  . HTN (hypertension) 02/05/2013  . Chest pain, atypical 02/05/2013  . Anxiety 02/05/2013  . Nicotine abuse 02/05/2013    Lulu Riding PT, DPT, LAT, ATC  11/04/19  9:30 AM      [facility] [address] [place], [place], [address] Phone: [phone]   Fax:  [phone]  Name: JENESIS MARTIN MRN: [id number] Date of Birth: 06/14/1936

## 2019-11-09 ENCOUNTER — Other Ambulatory Visit: Payer: Self-pay

## 2019-11-09 ENCOUNTER — Encounter: Payer: Self-pay | Admitting: Physical Therapy

## 2019-11-09 ENCOUNTER — Ambulatory Visit: Payer: Medicare Other | Admitting: Physical Therapy

## 2019-11-09 DIAGNOSIS — R2689 Other abnormalities of gait and mobility: Secondary | ICD-10-CM

## 2019-11-09 DIAGNOSIS — G8929 Other chronic pain: Secondary | ICD-10-CM

## 2019-11-09 DIAGNOSIS — M542 Cervicalgia: Secondary | ICD-10-CM

## 2019-11-09 DIAGNOSIS — R293 Abnormal posture: Secondary | ICD-10-CM

## 2019-11-09 DIAGNOSIS — M545 Low back pain, unspecified: Secondary | ICD-10-CM

## 2019-11-09 NOTE — Patient Instructions (Signed)
Flexors, Supine Bridge   Lie supine, feet shoulder-width apart. Lift hips toward ceiling. Hold __5_ seconds. Repeat _10__ times per session. Do _2__ sessions per day.  Hip Flexor Stretch   Lying on back near edge of bed, bend one leg, foot flat. Hang other leg over edge, relaxed, thigh resting entirely on bed for __1__ minutes. Repeat ____ times. Do ___2-3_ sessions per day. Advanced Exercise: Bend knee back keeping thigh in contact with bed.

## 2019-11-09 NOTE — Therapy (Signed)
[facility] [address] [place], [place], [address] Phone: [phone]   Fax:  [phone]  Physical Therapy Treatment  Patient Details  Name: Kiara Little MRN: [id number] Date of Birth: 04-Jan-1936 Referring Provider (PT): Raymon Mutton., FNP    Encounter Date: 11/09/2019  PT End of Session - 11/09/19 1026    Visit Number  5    Number of Visits  13    Date for PT Re-Evaluation  12/03/19    Authorization Type  MCR: Kx mod at 15th visit, progress note at 10th visit.    PT Start Time  0930    PT Stop Time  1030    PT Time Calculation (min)  60 min       Past Medical History:  Diagnosis Date  . Arthritis    OA LEFT HIP   . Chest pain   . Hypertension   . Muscle spasm   . Noncompliance   . Osteoarthritis    a. s/p R THA  . Pain    BOTH FEET "TIGHTNESS" "NO NERVE PAIN" - TAKES GABAPENTIN  . Tobacco abuse     Past Surgical History:  Procedure Laterality Date  . BACK SURGERY    . Cataract surgeries Right 07/2019  . JOINT REPLACEMENT  2000   RIGHT TOTAL HIP ARTHROPLASTY  . SURGERY FOR TUBAL PREGNANCY    . TOTAL HIP ARTHROPLASTY Left 07/24/2013   Procedure: LEFT TOTAL HIP ARTHROPLASTY ANTERIOR APPROACH;  Surgeon: Kathryne Hitch, MD;  Location: [facility];  Service: Orthopedics;  Laterality: Left;    There were no vitals filed for this visit.  Subjective Assessment - 11/09/19 0945    Subjective  "Tell me a day that I am not hurting. I feel all out of wack this morning"    Currently in Pain?  Yes    Pain Score  7     Pain Location  Back   and neck   Pain Orientation  Mid    Pain Descriptors / Indicators  Aching;Tightness    Pain Type  Chronic pain    Aggravating Factors   always hurting    Pain Relieving Factors  constant                       [facility] Adult PT Treatment/Exercise - 11/09/19 0001      Neck Exercises: Standing   Other Standing Exercises  standing rows and extension 10 x 2  each cues for hold time and posture       Neck Exercises: Seated   Other Seated Exercise  seated on dynadisc abdominal draw in with feet narrow: added UE motions (Ue flex/ext,,horizontal abduction, W back 10 reps each    cues for posture and abdominals      Neck Exercises: Supine   Neck Retraction  15 reps;5 secs   tactile cues for proper form     Lumbar Exercises: Stretches   Lower Trunk Rotation Limitations  2 x 10 with legs     Hip Flexor Stretch  1 rep;30 seconds    Hip Flexor Stretch Limitations  supine , tolerated well       Lumbar Exercises: Aerobic   Nustep  L5 x 5 min UE/LE       Lumbar Exercises: Seated   Other Seated Lumbar Exercises  dynadisc:pelvic tilits      Lumbar Exercises: Supine   Bent Knee Raise  10 reps    Bent Knee  Raise Limitations  cues for abdominal draw in     Bridge  10 reps   halted due to pt cramping in the hamstring   Bridge Limitations  can only tighten , no lift , starts to cramp after several reps       Modalities   Modalities  Moist Heat      Moist Heat Therapy   Number Minutes Moist Heat  15 Minutes    Moist Heat Location  Lumbar Spine   and cervical            PT Education - 11/09/19 1026    Education Details  HEP    Person(s) Educated  Patient    Methods  Explanation;Handout    Comprehension  Verbalized understanding       PT Short Term Goals - 10/22/19 1255      PT SHORT TERM GOAL #1   Title  pt to be I with inital HEP    Baseline  Lost her HEP, reprinted today    Time  3    Period  Weeks    Status  On-going      PT SHORT TERM GOAL #2   Title  pt to verbalize and demo proper posture and lifting/ gait mechanics to reduce and prevent neck/ low back pain    Time  3    Period  Weeks    Status  On-going        PT Long Term Goals - 09/01/19 1216      PT LONG TERM GOAL #1   Title  increase bil cervical sidebending/ rotation by >/=8 degrees to promote functional cervical mobility required for ADLs with </= 2/10  pain    Time  6    Period  Weeks    Status  New    Target Date  10/13/19      PT LONG TERM GOAL #2   Title  pt to report decreased lumbar paraspinal spasm to reduce pain to </= 2/10    Time  6    Period  Weeks    Status  New    Target Date  10/13/19      PT LONG TERM GOAL #3   Title  increase bil hip strength to >/= 4+/5 to promote good posture with lifting and efficient gait mechanics maximizing safety    Time  6    Period  Weeks    Status  New    Target Date  10/13/19      PT LONG TERM GOAL #4   Title  pt to be able to stand and walk for >/= 30 min for community ambulation and ADLs and for pt's personal goals of returning to regular activity    Time  6    Period  Weeks    Status  New    Target Date  10/13/19      PT LONG TERM GOAL #5   Title  increase FOTO score to </= 53% limited to demo improvement in function    Time  6    Period  Weeks    Status  New    Target Date  10/13/19            Plan - 11/09/19 1047    Clinical Impression Statement  Pt reports continued pain and worse this morning due to the weather. Continued with Nustep. began stanidng scapular bands and she tolerated well. Continued with dynadisc to improve core stabilization, added UE motions in  narrow stance. In hooklying she was able to squeeze gluteals only, no lift from mat. added bridge .gluteal squeeze to HEP. She does have intermittent cramps in the hamstrings. She was able to tolerate Hip flexor stretch in supine , dropping LE off table without increased pain today. Updated HEP.    PT Next Visit Plan  how is she doing with supine hip flexor stretch? add standing scap bands?    PT Home Exercise Plan  [id number] - upper trap stretch, chin tuck, lower trunk rotation, sidelying hip abduction, standing hip flexor stretching; added supine bridge and hip flexor stretch       Patient will benefit from skilled therapeutic intervention in order to improve the following deficits and impairments:  Pain,  Obesity, Postural dysfunction, Decreased strength, Increased muscle spasms, Improper body mechanics, Abnormal gait, Decreased activity tolerance, Decreased balance, Decreased range of motion, Decreased endurance, Decreased mobility  Visit Diagnosis: Cervicalgia  Chronic bilateral low back pain without sciatica  Abnormal posture  Other abnormalities of gait and mobility     Problem List Patient Active Problem List   Diagnosis Date Noted  . Dry eyes 03/25/2019  . Atherosclerosis of native arteries of extremity with intermittent claudication ([place]) 11/13/2017  . PAD (peripheral artery disease) ([place]) 07/26/2015  . Pain in joint, lower leg 08/02/2014  . Stiffness of joint, not elsewhere classified, pelvic region and thigh 04/30/2014  . Muscle weakness (generalized) 04/30/2014  . Degenerative arthritis of left hip 07/17/2013  . HTN (hypertension) 02/05/2013  . Chest pain, atypical 02/05/2013  . Anxiety 02/05/2013  . Nicotine abuse 02/05/2013    Dorene Ar, PTA 11/09/2019, 10:52 AM  [facility] [address] [place], [place], [address] Phone: [phone]   Fax:  [phone]  Name: Kiara Little MRN: [id number] Date of Birth: 07-08-1936

## 2019-11-11 ENCOUNTER — Ambulatory Visit: Payer: Medicare Other | Admitting: Physical Therapy

## 2019-11-16 ENCOUNTER — Other Ambulatory Visit: Payer: Self-pay

## 2019-11-16 ENCOUNTER — Ambulatory Visit: Payer: Medicare Other | Attending: Family | Admitting: Physical Therapy

## 2019-11-16 ENCOUNTER — Encounter: Payer: Self-pay | Admitting: Physical Therapy

## 2019-11-16 DIAGNOSIS — R293 Abnormal posture: Secondary | ICD-10-CM

## 2019-11-16 DIAGNOSIS — M545 Low back pain: Secondary | ICD-10-CM | POA: Insufficient documentation

## 2019-11-16 DIAGNOSIS — R2689 Other abnormalities of gait and mobility: Secondary | ICD-10-CM

## 2019-11-16 DIAGNOSIS — G8929 Other chronic pain: Secondary | ICD-10-CM

## 2019-11-16 DIAGNOSIS — M542 Cervicalgia: Secondary | ICD-10-CM | POA: Diagnosis not present

## 2019-11-16 NOTE — Therapy (Signed)
[facility] Outpatient Rehabilitation [facility] [address] [place], [place], [address] Phone: [phone]   Fax:  [phone]  Physical Therapy Treatment  Patient Details  Name: Kiara Little MRN: [id number] Date of Birth: January 13, 1936 Referring Provider (PT): Raymon Mutton., FNP    Encounter Date: 11/16/2019  PT End of Session - 11/16/19 0934    Visit Number  6    Number of Visits  13    Date for PT Re-Evaluation  12/03/19    Authorization Type  MCR: Kx mod at 15th visit, progress note at 10th visit.    PT Start Time  [phone]    PT Stop Time  1012    PT Time Calculation (min)  39 min    Activity Tolerance  Patient tolerated treatment well    Behavior During Therapy  WFL for tasks assessed/performed       Past Medical History:  Diagnosis Date  . Arthritis    OA LEFT HIP   . Chest pain   . Hypertension   . Muscle spasm   . Noncompliance   . Osteoarthritis    a. s/p R THA  . Pain    BOTH FEET "TIGHTNESS" "NO NERVE PAIN" - TAKES GABAPENTIN  . Tobacco abuse     Past Surgical History:  Procedure Laterality Date  . BACK SURGERY    . Cataract surgeries Right 07/2019  . JOINT REPLACEMENT  2000   RIGHT TOTAL HIP ARTHROPLASTY  . SURGERY FOR TUBAL PREGNANCY    . TOTAL HIP ARTHROPLASTY Left 07/24/2013   Procedure: LEFT TOTAL HIP ARTHROPLASTY ANTERIOR APPROACH;  Surgeon: Kathryne Hitch, MD;  Location: [facility];  Service: Orthopedics;  Laterality: Left;    There were no vitals filed for this visit.  Subjective Assessment - 11/16/19 0934    Subjective  pt reports having cataract surgery on Friday and reports  having pain inthe R eye and is wearing protective eyewear. The neck and back are getting better a little pain still about a 5/10    Patient Stated Goals  to be able to do house work, and other activities including lifting / walking    Currently in Pain?  Yes    Pain Score  5     Pain Location  Back    Pain Descriptors / Indicators   Aching;Tightness    Pain Type  Chronic pain    Pain Onset  More than a month ago    Pain Frequency  Intermittent    Aggravating Factors   walking    Pain Relieving Factors  sitting    Pain Score  5   more stiffness than pain   Pain Location  Neck    Pain Orientation  Right;Left    Pain Descriptors / Indicators  Aching;Tightness    Pain Type  Chronic pain    Pain Onset  More than a month ago    Pain Frequency  Intermittent    Aggravating Factors   looking to the L/R         [facility] PT Assessment - 11/16/19 0001      Assessment   Medical Diagnosis  Low back pain, L UE numbness/ tingling,     Referring Provider (PT)  Raymon Mutton., FNP       Observation/Other Assessments   Focus on Therapeutic Outcomes (FOTO)   46% liimited                   [facility] Adult  PT Treatment/Exercise - 11/16/19 0001      Neck Exercises: Theraband   Rows  10 reps;Green      Neck Exercises: Seated   Neck Retraction  10 reps;5 secs      Lumbar Exercises: Stretches   Hip Flexor Stretch  2 reps;30 seconds      Lumbar Exercises: Aerobic   Nustep  L6 x 6 min UE/LE    working on endurance training.      Lumbar Exercises: Standing   Other Standing Lumbar Exercises  forward step up on 6 inch step with 1 HHA for stability 2 x 10 bil    Other Standing Lumbar Exercises  hip abduction 2 x 10 bil with no weight and bil HHA, hip extension with hands on table i             PT Education - 11/16/19 1004    Education Details  updated HEP for hip strengthening.    Person(s) Educated  Patient    Methods  Explanation;Handout;Verbal cues    Comprehension  Verbalized understanding;Verbal cues required       PT Short Term Goals - 10/22/19 1255      PT SHORT TERM GOAL #1   Title  pt to be I with inital HEP    Baseline  Lost her HEP, reprinted today    Time  3    Period  Weeks    Status  On-going      PT SHORT TERM GOAL #2   Title  pt to verbalize and demo proper posture and lifting/  gait mechanics to reduce and prevent neck/ low back pain    Time  3    Period  Weeks    Status  On-going        PT Long Term Goals - 09/01/19 1216      PT LONG TERM GOAL #1   Title  increase bil cervical sidebending/ rotation by >/=8 degrees to promote functional cervical mobility required for ADLs with </= 2/10 pain    Time  6    Period  Weeks    Status  New    Target Date  10/13/19      PT LONG TERM GOAL #2   Title  pt to report decreased lumbar paraspinal spasm to reduce pain to </= 2/10    Time  6    Period  Weeks    Status  New    Target Date  10/13/19      PT LONG TERM GOAL #3   Title  increase bil hip strength to >/= 4+/5 to promote good posture with lifting and efficient gait mechanics maximizing safety    Time  6    Period  Weeks    Status  New    Target Date  10/13/19      PT LONG TERM GOAL #4   Title  pt to be able to stand and walk for >/= 30 min for community ambulation and ADLs and for pt's personal goals of returning to regular activity    Time  6    Period  Weeks    Status  New    Target Date  10/13/19      PT LONG TERM GOAL #5   Title  increase FOTO score to </= 53% limited to demo improvement in function    Time  6    Period  Weeks    Status  New    Target Date  10/13/19  Plan - 11/16/19 0959    Clinical Impression Statement  Kiara Little reports she feels she is getting better in both the neck and the back. upon further assessment she notes pain in the neck is more stiffness whereas the back pain is more 5/10. continued working on hip flexor stretching and posterior chain strengthening which she did well but fatigues quickly. end of session she reported that she is feeling good.    PT Treatment/Interventions  ADLs/Self Care Home Management;Cryotherapy;Electrical Stimulation;Iontophoresis 4mg /ml Dexamethasone;Moist Heat;Ultrasound;Therapeutic activities;Therapeutic exercise;Balance training;Neuromuscular re-education;Manual  techniques;Compression bandaging;Taping;Dry needling;Patient/family education    PT Next Visit Plan  how is she doing with supine hip flexor stretch? add standing scap bands? hip and core strengthening with emphasis on endurnace training.    PT Home Exercise Plan  [id number] - upper trap stretch, chin tuck, lower trunk rotation, sidelying hip abduction, standing hip flexor stretching; added supine bridge and hip flexor stretch standing hip abduction/ extension    Consulted and Agree with Plan of Care  Patient       Patient will benefit from skilled therapeutic intervention in order to improve the following deficits and impairments:  Pain, Obesity, Postural dysfunction, Decreased strength, Increased muscle spasms, Improper body mechanics, Abnormal gait, Decreased activity tolerance, Decreased balance, Decreased range of motion, Decreased endurance, Decreased mobility  Visit Diagnosis: Cervicalgia  Chronic bilateral low back pain without sciatica  Abnormal posture  Other abnormalities of gait and mobility     Problem List Patient Active Problem List   Diagnosis Date Noted  . Dry eyes 03/25/2019  . Atherosclerosis of native arteries of extremity with intermittent claudication (HCC) 11/13/2017  . PAD (peripheral artery disease) (HCC) 07/26/2015  . Pain in joint, lower leg 08/02/2014  . Stiffness of joint, not elsewhere classified, pelvic region and thigh 04/30/2014  . Muscle weakness (generalized) 04/30/2014  . Degenerative arthritis of left hip 07/17/2013  . HTN (hypertension) 02/05/2013  . Chest pain, atypical 02/05/2013  . Anxiety 02/05/2013  . Nicotine abuse 02/05/2013   02/07/2013 PT, DPT, LAT, ATC  11/16/19  10:16 AM      [facility] Outpatient Rehabilitation [facility] [address] [place], [place], [place] Phone: [phone]   Fax:  [phone]  Name: Kiara Little MRN: Lynnea Ferrier Date of Birth: 1936/07/23

## 2019-11-18 ENCOUNTER — Other Ambulatory Visit: Payer: Self-pay

## 2019-11-18 ENCOUNTER — Encounter: Payer: Self-pay | Admitting: Physical Therapy

## 2019-11-18 ENCOUNTER — Ambulatory Visit: Payer: Medicare Other | Admitting: Physical Therapy

## 2019-11-18 DIAGNOSIS — M542 Cervicalgia: Secondary | ICD-10-CM

## 2019-11-18 DIAGNOSIS — R293 Abnormal posture: Secondary | ICD-10-CM

## 2019-11-18 DIAGNOSIS — R2689 Other abnormalities of gait and mobility: Secondary | ICD-10-CM

## 2019-11-18 DIAGNOSIS — G8929 Other chronic pain: Secondary | ICD-10-CM

## 2019-11-18 NOTE — Therapy (Signed)
[place], [place], [address] Phone: [phone]   Fax:  [phone]  Physical Therapy Treatment  Patient Details  Name: Kiara Little MRN: [id number] Date of Birth: [birth year] Referring Provider (PT): Sonia Side., FNP    Encounter Date: 11/18/2019  PT End of Session - 11/18/19 0932    Visit Number  7    Number of Visits  13    Date for PT Re-Evaluation  12/03/19    Authorization Type  MCR: Kx mod at 15th visit, progress note at 10th visit.    PT Start Time  0930    PT Stop Time  1012    PT Time Calculation (min)  42 min       Past Medical History:  Diagnosis Date  . Arthritis    OA LEFT HIP   . Chest pain   . Hypertension   . Muscle spasm   . Noncompliance   . Osteoarthritis    a. s/p R THA  . Pain    BOTH FEET "TIGHTNESS" "NO NERVE PAIN" - TAKES GABAPENTIN  . Tobacco abuse     Past Surgical History:  Procedure Laterality Date  . BACK SURGERY    . Cataract surgeries Right 07/2019  . JOINT REPLACEMENT  2000   RIGHT TOTAL HIP ARTHROPLASTY  . SURGERY FOR TUBAL PREGNANCY    . TOTAL HIP ARTHROPLASTY Left 07/24/2013   Procedure: LEFT TOTAL HIP ARTHROPLASTY ANTERIOR APPROACH;  Surgeon: Mcarthur Rossetti, MD;  Location: [facility];  Service: Orthopedics;  Laterality: Left;    There were no vitals filed for this visit.  Subjective Assessment - 11/18/19 0933    Subjective  Pt arrives with Sunglasses and reports light is still bright from cataract surgery.    Currently in Pain?  Yes    Pain Score  7     Pain Location  Back    Pain Orientation  Mid;Lower    Pain Descriptors / Indicators  Sore    Pain Type  Chronic pain                       [facility] Adult PT Treatment/Exercise - 11/18/19 0001      Neck Exercises: Standing   Other Standing Exercises  standing rows and extension 10 x 2 each cues for hold time and posture    green     Neck Exercises: Seated   Other  Seated Exercise  seated on dynadisc abdominal draw in with feet narrow: added UE motions (Ue flex/ext,,horizontal abduction, W back 10 reps each    cues for posture and abdominals      Neck Exercises: Supine   Neck Retraction  10 reps      Lumbar Exercises: Stretches   Hip Flexor Stretch  2 reps;30 seconds    Hip Flexor Stretch Limitations  supine , tolerated well       Lumbar Exercises: Aerobic   Nustep  L5 x 7 min UE/LE    working on endurance training.      Lumbar Exercises: Standing   Other Standing Lumbar Exercises  reach up cabinet with opp leg weight shift forward x 10     Other Standing Lumbar Exercises  hip abduction 2 x 10 bil with no weight and bil HHA, hip extension with hands on table i      Lumbar Exercises: Supine   Clam  20 reps    Clam Limitations  green  Bent Knee Raise  10 reps    Bent Knee Raise Limitations  cues for abdominal draw in     Bridge  20 reps    Bridge Limitations  legs over bolster                PT Short Term Goals - 11/18/19 1020      PT SHORT TERM GOAL #1   Title  pt to be I with inital HEP    Baseline  independent    Time  3    Period  Weeks    Status  Achieved      PT SHORT TERM GOAL #2   Title  pt to verbalize and demo proper posture and lifting/ gait mechanics to reduce and prevent neck/ low back pain    Time  3    Period  Weeks    Status  On-going        PT Long Term Goals - 09/01/19 1216      PT LONG TERM GOAL #1   Title  increase bil cervical sidebending/ rotation by >/=8 degrees to promote functional cervical mobility required for ADLs with </= 2/10 pain    Time  6    Period  Weeks    Status  New    Target Date  10/13/19      PT LONG TERM GOAL #2   Title  pt to report decreased lumbar paraspinal spasm to reduce pain to </= 2/10    Time  6    Period  Weeks    Status  New    Target Date  10/13/19      PT LONG TERM GOAL #3   Title  increase bil hip strength to >/= 4+/5 to promote good posture with  lifting and efficient gait mechanics maximizing safety    Time  6    Period  Weeks    Status  New    Target Date  10/13/19      PT LONG TERM GOAL #4   Title  pt to be able to stand and walk for >/= 30 min for community ambulation and ADLs and for pt's personal goals of returning to regular activity    Time  6    Period  Weeks    Status  New    Target Date  10/13/19      PT LONG TERM GOAL #5   Title  increase FOTO score to </= 53% limited to demo improvement in function    Time  6    Period  Weeks    Status  New    Target Date  10/13/19            Plan - 11/18/19 0934    Clinical Impression Statement  Mrs Flippin reports increased back soreness due to consistent HEP compliance. She rates her soreness at 7/10 today. Continued with strengthening and posterior chain focus to increased upright posture. Pt tolerated session well.    PT Next Visit Plan  how is she doing with supine hip flexor stretch? add standing scap bands? hip and core strengthening with emphasis on endurnace training.    PT Home Exercise Plan  [id number] - upper trap stretch, chin tuck, lower trunk rotation, sidelying hip abduction, standing hip flexor stretching; added supine bridge and hip flexor stretch standing hip abduction/ extension       Patient will benefit from skilled therapeutic intervention in order to improve the following deficits and impairments:  Pain,  Obesity, Postural dysfunction, Decreased strength, Increased muscle spasms, Improper body mechanics, Abnormal gait, Decreased activity tolerance, Decreased balance, Decreased range of motion, Decreased endurance, Decreased mobility  Visit Diagnosis: Cervicalgia  Chronic bilateral low back pain without sciatica  Abnormal posture  Other abnormalities of gait and mobility     Problem List Patient Active Problem List   Diagnosis Date Noted  . Dry eyes 03/25/2019  . Atherosclerosis of native arteries of extremity with intermittent claudication  (HCC) 11/13/2017  . PAD (peripheral artery disease) (HCC) 07/26/2015  . Pain in joint, lower leg 08/02/2014  . Stiffness of joint, not elsewhere classified, pelvic region and thigh 04/30/2014  . Muscle weakness (generalized) 04/30/2014  . Degenerative arthritis of left hip 07/17/2013  . HTN (hypertension) 02/05/2013  . Chest pain, atypical 02/05/2013  . Anxiety 02/05/2013  . Nicotine abuse 02/05/2013    Sherrie Mustache, PTA 11/18/2019, 10:20 AM  [facility] [address] [place], [place], [address] Phone: [phone]   Fax:  [phone]  Name: Kiara Little MRN: [id number] Date of Birth: 1936-04-14

## 2019-11-20 ENCOUNTER — Ambulatory Visit: Payer: Medicare Other | Admitting: Orthotics

## 2019-11-20 ENCOUNTER — Other Ambulatory Visit: Payer: Self-pay

## 2019-11-20 DIAGNOSIS — I739 Peripheral vascular disease, unspecified: Secondary | ICD-10-CM

## 2019-11-23 ENCOUNTER — Ambulatory Visit: Payer: Medicare Other | Admitting: Physical Therapy

## 2019-11-23 ENCOUNTER — Other Ambulatory Visit: Payer: Self-pay

## 2019-11-23 DIAGNOSIS — M542 Cervicalgia: Secondary | ICD-10-CM | POA: Diagnosis not present

## 2019-11-23 DIAGNOSIS — R293 Abnormal posture: Secondary | ICD-10-CM

## 2019-11-23 DIAGNOSIS — R2689 Other abnormalities of gait and mobility: Secondary | ICD-10-CM

## 2019-11-23 DIAGNOSIS — G8929 Other chronic pain: Secondary | ICD-10-CM

## 2019-11-23 NOTE — Therapy (Signed)
[facility] Outpatient Rehabilitation [facility] [address] [place], [place], [address] Phone: [phone]   Fax:  [phone]  Physical Therapy Treatment  Patient Details  Name: Kiara Little MRN: [id number] Date of Birth: 04-27-1936 Referring Provider (PT): Raymon Mutton., FNP    Encounter Date: 11/23/2019  PT End of Session - 11/23/19 1019    Visit Number  8    Number of Visits  13    Date for PT Re-Evaluation  12/03/19    Authorization Type  MCR: Kx mod at 15th visit, progress note at 10th visit.    PT Start Time  0930    PT Stop Time  1015    PT Time Calculation (min)  45 min       Past Medical History:  Diagnosis Date  . Arthritis    OA LEFT HIP   . Chest pain   . Hypertension   . Muscle spasm   . Noncompliance   . Osteoarthritis    a. s/p R THA  . Pain    BOTH FEET "TIGHTNESS" "NO NERVE PAIN" - TAKES GABAPENTIN  . Tobacco abuse     Past Surgical History:  Procedure Laterality Date  . BACK SURGERY    . Cataract surgeries Right 07/2019  . JOINT REPLACEMENT  2000   RIGHT TOTAL HIP ARTHROPLASTY  . SURGERY FOR TUBAL PREGNANCY    . TOTAL HIP ARTHROPLASTY Left 07/24/2013   Procedure: LEFT TOTAL HIP ARTHROPLASTY ANTERIOR APPROACH;  Surgeon: Kathryne Hitch, MD;  Location: [facility];  Service: Orthopedics;  Laterality: Left;    There were no vitals filed for this visit.  Subjective Assessment - 11/23/19 0937    Subjective  I am just sore, I bought a stability ball and brought it to show you. Back is sore, neck is not tight like it was before.    Currently in Pain?  Yes   just sore   Pain Score  0-No pain   just sore   Pain Location  Back    Pain Score  5    Pain Location  Neck    Pain Orientation  Right;Left    Pain Descriptors / Indicators  Sore    Pain Type  Chronic pain                       [facility] Adult PT Treatment/Exercise - 11/23/19 0001      Neck Exercises: Standing   Other Standing Exercises  standing  rows and extension 10 x 2 each cues for hold time and posture    green     Lumbar Exercises: Stretches   Lower Trunk Rotation Limitations  2 x 10 with legs     Hip Flexor Stretch  4 reps;30 seconds    Hip Flexor Stretch Limitations  supine and standing , tolerated well       Lumbar Exercises: Aerobic   Nustep  L4 x 8 minutes       Lumbar Exercises: Standing   Other Standing Lumbar Exercises  reach up cabinet with opp leg weight shift forward x 10     Other Standing Lumbar Exercises  hip abduction 2 x 10 bil with no weight and bil HHA, hip extension with hands on table i      Lumbar Exercises: Seated   Other Seated Lumbar Exercises  dynadisc:pelvic tilits, LAQ with opp arm raise, seated dead bug       Lumbar Exercises: Supine  Bridge  20 reps    Bridge Limitations  legs over ball                PT Short Term Goals - 11/18/19 1020      PT SHORT TERM GOAL #1   Title  pt to be I with inital HEP    Baseline  independent    Time  3    Period  Weeks    Status  Achieved      PT SHORT TERM GOAL #2   Title  pt to verbalize and demo proper posture and lifting/ gait mechanics to reduce and prevent neck/ low back pain    Time  3    Period  Weeks    Status  On-going        PT Long Term Goals - 09/01/19 1216      PT LONG TERM GOAL #1   Title  increase bil cervical sidebending/ rotation by >/=8 degrees to promote functional cervical mobility required for ADLs with </= 2/10 pain    Time  6    Period  Weeks    Status  New    Target Date  10/13/19      PT LONG TERM GOAL #2   Title  pt to report decreased lumbar paraspinal spasm to reduce pain to </= 2/10    Time  6    Period  Weeks    Status  New    Target Date  10/13/19      PT LONG TERM GOAL #3   Title  increase bil hip strength to >/= 4+/5 to promote good posture with lifting and efficient gait mechanics maximizing safety    Time  6    Period  Weeks    Status  New    Target Date  10/13/19      PT LONG TERM  GOAL #4   Title  pt to be able to stand and walk for >/= 30 min for community ambulation and ADLs and for pt's personal goals of returning to regular activity    Time  6    Period  Weeks    Status  New    Target Date  10/13/19      PT LONG TERM GOAL #5   Title  increase FOTO score to </= 53% limited to demo improvement in function    Time  6    Period  Weeks    Status  New    Target Date  10/13/19            Plan - 11/23/19 0936    Clinical Impression Statement  Pt reports having more frequent days with less pain. Continued with closed and open chain gluteal activation and hip flexor stretching. Reviewed dyna disc exercises as she purchased one for home.    PT Next Visit Plan  ERO verses discahrge    PT Home Exercise Plan  [id number] - upper trap stretch, chin tuck, lower trunk rotation, sidelying hip abduction, standing hip flexor stretching; added supine bridge and hip flexor stretch standing hip abduction/ extension       Patient will benefit from skilled therapeutic intervention in order to improve the following deficits and impairments:  Pain, Obesity, Postural dysfunction, Decreased strength, Increased muscle spasms, Improper body mechanics, Abnormal gait, Decreased activity tolerance, Decreased balance, Decreased range of motion, Decreased endurance, Decreased mobility  Visit Diagnosis: Cervicalgia  Chronic bilateral low back pain without sciatica  Other abnormalities of gait and  mobility  Abnormal posture     Problem List Patient Active Problem List   Diagnosis Date Noted  . Dry eyes 03/25/2019  . Atherosclerosis of native arteries of extremity with intermittent claudication ([place]) 11/13/2017  . PAD (peripheral artery disease) ([place]) 07/26/2015  . Pain in joint, lower leg 08/02/2014  . Stiffness of joint, not elsewhere classified, pelvic region and thigh 04/30/2014  . Muscle weakness (generalized) 04/30/2014  . Degenerative arthritis of left hip 07/17/2013  .  HTN (hypertension) 02/05/2013  . Chest pain, atypical 02/05/2013  . Anxiety 02/05/2013  . Nicotine abuse 02/05/2013    Hessie Diener [facility] 11/23/2019, 10:24 AM  [facility] [address] [place], [place], [address] Phone: [phone]   Fax:  [phone]  Name: CRISTIN PENAFLOR MRN: [id number] Date of Birth: 1936-03-06

## 2019-11-25 ENCOUNTER — Other Ambulatory Visit: Payer: Self-pay

## 2019-11-25 ENCOUNTER — Ambulatory Visit: Payer: Medicare Other | Admitting: Physical Therapy

## 2019-11-25 ENCOUNTER — Encounter: Payer: Self-pay | Admitting: Physical Therapy

## 2019-11-25 DIAGNOSIS — R293 Abnormal posture: Secondary | ICD-10-CM

## 2019-11-25 DIAGNOSIS — M542 Cervicalgia: Secondary | ICD-10-CM

## 2019-11-25 DIAGNOSIS — G8929 Other chronic pain: Secondary | ICD-10-CM

## 2019-11-25 DIAGNOSIS — M545 Low back pain, unspecified: Secondary | ICD-10-CM

## 2019-11-25 DIAGNOSIS — R2689 Other abnormalities of gait and mobility: Secondary | ICD-10-CM

## 2019-11-25 NOTE — Therapy (Signed)
[place], [place], [address] Phone: [phone]   Fax:  [phone]  Physical Therapy Treatment Progress Note Reporting Period 08/31/2020 to 11/25/2019   See note below for Objective Data and Assessment of Progress/Goals.       Patient Details  Name: Kiara Little MRN: [id number] Date of Birth: [birth year] Referring Provider (PT): Sonia Side., FNP    Encounter Date: 11/25/2019  PT End of Session - 11/25/19 0929    Visit Number  9    Number of Visits  13    Date for PT Re-Evaluation  12/03/19    Authorization Type  MCR: Kx mod at 15th visit,    Progress Note Due on Visit  19    PT Start Time  0930    PT Stop Time  1008    PT Time Calculation (min)  38 min    Activity Tolerance  Patient tolerated treatment well    Behavior During Therapy  [facility] for tasks assessed/performed       Past Medical History:  Diagnosis Date  . Arthritis    OA LEFT HIP   . Chest pain   . Hypertension   . Muscle spasm   . Noncompliance   . Osteoarthritis    a. s/p R THA  . Pain    BOTH FEET "TIGHTNESS" "NO NERVE PAIN" - TAKES GABAPENTIN  . Tobacco abuse     Past Surgical History:  Procedure Laterality Date  . BACK SURGERY    . Cataract surgeries Right 07/2019  . JOINT REPLACEMENT  2000   RIGHT TOTAL HIP ARTHROPLASTY  . SURGERY FOR TUBAL PREGNANCY    . TOTAL HIP ARTHROPLASTY Left 07/24/2013   Procedure: LEFT TOTAL HIP ARTHROPLASTY ANTERIOR APPROACH;  Surgeon: Mcarthur Rossetti, MD;  Location: [facility];  Service: Orthopedics;  Laterality: Left;    There were no vitals filed for this visit.  Subjective Assessment - 11/25/19 0932    Subjective  " my hps and core are sore. the neck and back are getting better, but I still feel like I am about a5/10"    Patient Stated Goals  to be able to do house work, and other activities including lifting / walking    Currently in Pain?  Yes    Pain Score  5     Pain  Location  Back    Pain Orientation  Lower    Aggravating Factors   prolonged walking    Pain Score  5    Pain Location  Back    Pain Orientation  Right;Left    Pain Descriptors / Indicators  Aching    Pain Type  Chronic pain    Pain Onset  More than a month ago    Pain Frequency  Intermittent    Aggravating Factors   sitting and reading         [facility] PT Assessment - 11/25/19 0001      Assessment   Medical Diagnosis  Low back pain, L UE numbness/ tingling,     Referring Provider (PT)  Sonia Side., FNP       AROM   Cervical Flexion  30    Cervical Extension  32    Cervical - Right Side Bend  12    Cervical - Left Side Bend  20    Cervical - Right Rotation  44    Cervical - Left Rotation  49    Lumbar  Flexion  66    Lumbar Extension  15    Lumbar - Right Side Bend  10    Lumbar - Left Side Bend  10      Strength   Right Hip Flexion  4+/5    Right Hip Extension  4/5    Right Hip ABduction  4+/5    Left Hip Flexion  4+/5    Left Hip Extension  4/5    Left Hip ABduction  4/5    Left Hip ADduction  4+/5                   [facility] Adult PT Treatment/Exercise - 11/25/19 0001      Neck Exercises: Seated   Neck Retraction  10 reps;5 secs      Lumbar Exercises: Aerobic   Tread Mill  1.0 MHP x 3 min    pt got very fatigued and out of breath.     Lumbar Exercises: Seated   Other Seated Lumbar Exercises  dynadisc:pelvic tilits, LAQ with opp arm raise, seated dead bug              PT Education - 11/25/19 1003    Education Details  reviewed HEP and disucssed POC for the next visit, and setting up a visit with her cardiologist based on her response to walking on the treadmill.    Person(s) Educated  Patient    Methods  Explanation;Verbal cues    Comprehension  Verbalized understanding;Verbal cues required       PT Short Term Goals - 11/25/19 0936      PT SHORT TERM GOAL #1   Title  pt to be I with inital HEP    Period  Weeks    Status  Achieved       PT SHORT TERM GOAL #2   Title  pt to verbalize and demo proper posture and lifting/ gait mechanics to reduce and prevent neck/ low back pain    Period  Weeks    Status  Achieved        PT Long Term Goals - 11/25/19 7425      PT LONG TERM GOAL #1   Title  increase bil cervical sidebending/ rotation by >/=8 degrees to promote functional cervical mobility required for ADLs with </= 2/10 pain    Period  Weeks    Status  Partially Met      PT LONG TERM GOAL #2   Title  pt to report decreased lumbar paraspinal spasm to reduce pain to </= 2/10    Period  Weeks    Status  On-going      PT LONG TERM GOAL #3   Title  increase bil hip strength to >/= 4+/5 to promote good posture with lifting and efficient gait mechanics maximizing safety    Baseline  hip abduction 4/5    Period  Weeks    Status  Partially Met      PT LONG TERM GOAL #4   Title  pt to be able to stand and walk for >/= 30 min for community ambulation and ADLs and for pt's personal goals of returning to regular activity    Period  Weeks    Status  Achieved      PT LONG TERM GOAL #5   Title  increase FOTO score to </= 53% limited to demo improvement in function    Period  Weeks    Status  Achieved  Plan - 11/25/19 0959    Clinical Impression Statement  pt reports she is doingbetter in the back and the neck and is making good progress toward her goals. pt demonstrated increased fatigue and SOB with walking on the treadmill and required a seated rest break. based on her responde to walking on the treadmill she would benefit from seeing a cardiologist. plan to see pt back for 1 more visit to finalize HEP.    PT Treatment/Interventions  ADLs/Self Care Home Management;Cryotherapy;Electrical Stimulation;Iontophoresis 58m/ml Dexamethasone;Moist Heat;Ultrasound;Therapeutic activities;Therapeutic exercise;Balance training;Neuromuscular re-education;Manual techniques;Compression bandaging;Taping;Dry  needling;Patient/family education    PT Next Visit Plan  D/C next visit.    PT Home Exercise Plan  [id number] - upper trap stretch, chin tuck, lower trunk rotation, sidelying hip abduction, standing hip flexor stretching; added supine bridge and hip flexor stretch standing hip abduction/ extension       Patient will benefit from skilled therapeutic intervention in order to improve the following deficits and impairments:  Pain, Obesity, Postural dysfunction, Decreased strength, Increased muscle spasms, Improper body mechanics, Abnormal gait, Decreased activity tolerance, Decreased balance, Decreased range of motion, Decreased endurance, Decreased mobility  Visit Diagnosis: Cervicalgia  Chronic bilateral low back pain without sciatica  Other abnormalities of gait and mobility  Abnormal posture     Problem List Patient Active Problem List   Diagnosis Date Noted  . Dry eyes 03/25/2019  . Atherosclerosis of native arteries of extremity with intermittent claudication ([place] 11/13/2017  . PAD (peripheral artery disease) ([place] 07/26/2015  . Pain in joint, lower leg 08/02/2014  . Stiffness of joint, not elsewhere classified, pelvic region and thigh 04/30/2014  . Muscle weakness (generalized) 04/30/2014  . Degenerative arthritis of left hip 07/17/2013  . HTN (hypertension) 02/05/2013  . Chest pain, atypical 02/05/2013  . Anxiety 02/05/2013  . Nicotine abuse 02/05/2013   KStarr LakePT, DPT, LAT, ATC  11/25/19  10:09 AM      [facility] [address]Phone: [phone]  Fax:  [phone] Name: Kiara AGREDANOMRN: [id number] of Birth: [birth year]

## 2019-11-28 ENCOUNTER — Other Ambulatory Visit: Payer: Self-pay | Admitting: Vascular Surgery

## 2019-11-30 ENCOUNTER — Ambulatory Visit: Payer: Medicare Other | Admitting: Physical Therapy

## 2019-11-30 ENCOUNTER — Other Ambulatory Visit: Payer: Self-pay

## 2019-11-30 ENCOUNTER — Encounter: Payer: Self-pay | Admitting: Physical Therapy

## 2019-11-30 DIAGNOSIS — R2689 Other abnormalities of gait and mobility: Secondary | ICD-10-CM

## 2019-11-30 DIAGNOSIS — M542 Cervicalgia: Secondary | ICD-10-CM | POA: Diagnosis not present

## 2019-11-30 DIAGNOSIS — G8929 Other chronic pain: Secondary | ICD-10-CM

## 2019-11-30 DIAGNOSIS — R293 Abnormal posture: Secondary | ICD-10-CM

## 2019-11-30 NOTE — Therapy (Signed)
[place], [place], [address] Phone: [phone]   Fax:  [phone]  Physical Therapy Treatment / Discharge  Patient Details  Name: Kiara Little MRN: [id number] Date of Birth: 1936/03/14 Referring Provider (PT): Sonia Side., FNP    Encounter Date: 11/30/2019  PT End of Session - 11/30/19 0844    Visit Number  10    Number of Visits  13    Date for PT Re-Evaluation  12/03/19    Authorization Type  MCR: Kx mod at 15th visit,    PT Start Time  0844    PT Stop Time  0914    PT Time Calculation (min)  30 min    Activity Tolerance  Patient tolerated treatment well    Behavior During Therapy  [facility] for tasks assessed/performed       Past Medical History:  Diagnosis Date  . Arthritis    OA LEFT HIP   . Chest pain   . Hypertension   . Muscle spasm   . Noncompliance   . Osteoarthritis    a. s/p R THA  . Pain    BOTH FEET "TIGHTNESS" "NO NERVE PAIN" - TAKES GABAPENTIN  . Tobacco abuse     Past Surgical History:  Procedure Laterality Date  . BACK SURGERY    . Cataract surgeries Right 07/2019  . JOINT REPLACEMENT  2000   RIGHT TOTAL HIP ARTHROPLASTY  . SURGERY FOR TUBAL PREGNANCY    . TOTAL HIP ARTHROPLASTY Left 07/24/2013   Procedure: LEFT TOTAL HIP ARTHROPLASTY ANTERIOR APPROACH;  Surgeon: Mcarthur Rossetti, MD;  Location: [facility];  Service: Orthopedics;  Laterality: Left;    There were no vitals filed for this visit.  Subjective Assessment - 11/30/19 0847    Subjective  "I am doing pretty good, I do my exercise at home when I get time from doing my house work"    Currently in Pain?  No/denies    Pain Onset  More than a month ago    Pain Frequency  Occasional    Aggravating Factors   prolonged walking    Pain Relieving Factors  sitting         [facility] PT Assessment - 11/30/19 0001      Assessment   Medical Diagnosis  Low back pain, L UE numbness/ tingling,     Referring  Provider (PT)  Sonia Side., FNP       Observation/Other Assessments   Focus on Therapeutic Outcomes (FOTO)   54% limited      6 Minute Walk- Baseline   6 Minute Walk- Baseline  yes    BP (mmHg)  138/83    HR (bpm)  54    02 Sat (%RA)  97 %    Modified Borg Scale for Dyspnea  1- Very mild shortness of breath    Perceived Rate of Exertion (Borg)  9- very light      6 Minute walk- Post Test   6 Minute Walk Post Test  yes    BP (mmHg)  158/85    HR (bpm)  92    02 Sat (%RA)  92 %    Modified Borg Scale for Dyspnea  3- Moderate shortness of breath or breathing difficulty    Perceived Rate of Exertion (Borg)  13- Somewhat hard      6 minute walk test results    Aerobic Endurance Distance Walked  185    Endurance  additional comments  performed 2 min walk vs                            PT Education - 11/30/19 0914    Education Details  reviewed previously provided HEP and discussed importance onf consistency with her HEP, and discussed importance of setting up an appointment with her PCP or cardiologist.    Person(s) Educated  Patient    Methods  Explanation;Verbal cues    Comprehension  Verbalized understanding;Verbal cues required       PT Short Term Goals - 11/25/19 0936      PT SHORT TERM GOAL #1   Title  pt to be I with inital HEP    Period  Weeks    Status  Achieved      PT SHORT TERM GOAL #2   Title  pt to verbalize and demo proper posture and lifting/ gait mechanics to reduce and prevent neck/ low back pain    Period  Weeks    Status  Achieved        PT Long Term Goals - 11/25/19 3734      PT LONG TERM GOAL #1   Title  increase bil cervical sidebending/ rotation by >/=8 degrees to promote functional cervical mobility required for ADLs with </= 2/10 pain    Period  Weeks    Status  Partially Met      PT LONG TERM GOAL #2   Title  pt to report decreased lumbar paraspinal spasm to reduce pain to </= 2/10    Period  Weeks    Status   On-going      PT LONG TERM GOAL #3   Title  increase bil hip strength to >/= 4+/5 to promote good posture with lifting and efficient gait mechanics maximizing safety    Baseline  hip abduction 4/5    Period  Weeks    Status  Partially Met      PT LONG TERM GOAL #4   Title  pt to be able to stand and walk for >/= 30 min for community ambulation and ADLs and for pt's personal goals of returning to regular activity    Period  Weeks    Status  Achieved      PT LONG TERM GOAL #5   Title  increase FOTO score to </= 53% limited to demo improvement in function    Period  Weeks    Status  Achieved            Plan - 11/30/19 0916    Clinical Impression Statement  Mrs Kristensen has doing well with physical therapy regarding her neck and low back with rpeorted decreased pain. At this point her issue seems to be more potentially related to her cardiorespiratory system, which during 2 min walk test she was able to amb 185 ft was quite fatigued and her O2 dropped to 92-93% and HR went into the 80's with RPE at 13/20 and borg scale at 3/10. Based on assessment she would benefit from following up with her PCP or cardiologist.  She met or partially met all goals and is able to maintain and progress her current level of function and will be discharged from PT today.    PT Treatment/Interventions  ADLs/Self Care Home Management;Cryotherapy;Electrical Stimulation;Iontophoresis 18m/ml Dexamethasone;Moist Heat;Ultrasound;Therapeutic activities;Therapeutic exercise;Balance training;Neuromuscular re-education;Manual techniques;Compression bandaging;Taping;Dry needling;Patient/family education    PT Next Visit Plan  D/C    PT Home  Exercise Plan  [id number] - upper trap stretch, chin tuck, lower trunk rotation, sidelying hip abduction, standing hip flexor stretching; added supine bridge and hip flexor stretch standing hip abduction/ extension    Consulted and Agree with Plan of Care  Patient       Patient will  benefit from skilled therapeutic intervention in order to improve the following deficits and impairments:  Pain, Obesity, Postural dysfunction, Decreased strength, Increased muscle spasms, Improper body mechanics, Abnormal gait, Decreased activity tolerance, Decreased balance, Decreased range of motion, Decreased endurance, Decreased mobility  Visit Diagnosis: Cervicalgia  Chronic bilateral low back pain without sciatica  Other abnormalities of gait and mobility  Abnormal posture     Problem List Patient Active Problem List   Diagnosis Date Noted  . Dry eyes 03/25/2019  . Atherosclerosis of native arteries of extremity with intermittent claudication ([place]) 11/13/2017  . PAD (peripheral artery disease) ([place]) 07/26/2015  . Pain in joint, lower leg 08/02/2014  . Stiffness of joint, not elsewhere classified, pelvic region and thigh 04/30/2014  . Muscle weakness (generalized) 04/30/2014  . Degenerative arthritis of left hip 07/17/2013  . HTN (hypertension) 02/05/2013  . Chest pain, atypical 02/05/2013  . Anxiety 02/05/2013  . Nicotine abuse 02/05/2013    Starr Lake 11/30/2019, 9:22 AM  [facility] [address] [place], [place], [address] Phone: [phone]   Fax:  [phone]  Name: JUVIA AERTS MRN: [id number] Date of Birth: [birth year]      PHYSICAL THERAPY DISCHARGE SUMMARY  Visits from Start of Care: 10  Current functional level related to goals / functional outcomes: See goals, FOTO score 54% limited   Remaining deficits: Increased fatigue with walking/ standing potentially related more to cardiorespiratory system. Intermittent soreness/ tightness in the neck/ low back that is controlled with exercise.    Education / Equipment: HEP, theraband, posture, lifting/ walking mechanics.   Plan: Patient agrees to discharge.  Patient goals were partially met. Patient is being discharged due to being pleased  with the current functional level.  ?????        Ulanda Tackett PT, DPT, LAT, ATC  11/30/19  9:23 AM

## 2019-12-01 ENCOUNTER — Telehealth: Payer: Self-pay

## 2019-12-01 NOTE — Telephone Encounter (Signed)
NOTES ON FILE FROM [facility] [phone], REFERRAL SENT TO SCHEDULING

## 2019-12-03 ENCOUNTER — Ambulatory Visit: Payer: Medicare Other | Admitting: Physical Therapy

## 2019-12-14 ENCOUNTER — Ambulatory Visit: Payer: Medicare Other | Admitting: Physical Therapy

## 2019-12-17 ENCOUNTER — Other Ambulatory Visit (HOSPITAL_COMMUNITY): Payer: Medicare Other

## 2019-12-17 ENCOUNTER — Other Ambulatory Visit: Payer: Self-pay

## 2019-12-17 ENCOUNTER — Encounter: Payer: Self-pay | Admitting: Cardiology

## 2019-12-17 ENCOUNTER — Ambulatory Visit (INDEPENDENT_AMBULATORY_CARE_PROVIDER_SITE_OTHER): Payer: Medicare Other | Admitting: Cardiology

## 2019-12-17 VITALS — BP 144/80 | HR 77 | Temp 96.5°F | Ht 69.0 in | Wt 207.0 lb

## 2019-12-17 DIAGNOSIS — E782 Mixed hyperlipidemia: Secondary | ICD-10-CM | POA: Diagnosis not present

## 2019-12-17 DIAGNOSIS — R079 Chest pain, unspecified: Secondary | ICD-10-CM | POA: Diagnosis not present

## 2019-12-17 DIAGNOSIS — R0600 Dyspnea, unspecified: Secondary | ICD-10-CM

## 2019-12-17 DIAGNOSIS — R011 Cardiac murmur, unspecified: Secondary | ICD-10-CM

## 2019-12-17 DIAGNOSIS — I10 Essential (primary) hypertension: Secondary | ICD-10-CM | POA: Diagnosis not present

## 2019-12-17 DIAGNOSIS — R0609 Other forms of dyspnea: Secondary | ICD-10-CM

## 2019-12-17 NOTE — Progress Notes (Signed)
Cardiology Office Note  Date: 12/17/2019   ID: ASLYN Little, DOB 26-Jan-1936, MRN [id number]  PCP:  Sonia Side., FNP  Consulting Cardiologist:  Kiara Lesches, Little Electrophysiologist:  None   Chief Complaint  Patient presents with  . Dyspnea on exertion    History of Present Illness: Kiara Little is an 84 y.o. female referred for cardiology consultation by Mr. Kiara Julian NP for the evaluation of chest pain.  I reviewed the available records.  She denies actually having any chest discomfort.  She states that the reason she is here is because she was noticed to be short of breath while undergoing outpatient physical therapy.  She does describe dyspnea on exertion with ADLs and house chores.  No palpitations, dizziness, or syncope.  Record review finds previous evaluation by Dr. Lattie Little and last follow-up with [name] back in 2014.  Lexiscan Myoview at that time was negative for ischemia.  She has history of PAD based on noninvasive imaging, hypertension, and hyperlipidemia.  I reviewed her current medications as outlined below.  I personally reviewed her ECG today which shows sinus rhythm with left bundle branch block, nonconducted PACs.  Past Medical History:  Diagnosis Date  . Chronic back pain   . COPD (chronic obstructive pulmonary disease) ([place])   . COVID-06 October 2019  . Hyperlipidemia   . Hypertension   . Muscle spasm   . Noncompliance   . Osteoarthritis    a. s/p R THA  . PAD (peripheral artery disease) ([place])     Past Surgical History:  Procedure Laterality Date  . BACK SURGERY    . Cataract surgeries Right 07/2019  . JOINT REPLACEMENT  2000   RIGHT TOTAL HIP ARTHROPLASTY  . SURGERY FOR TUBAL PREGNANCY    . TOTAL HIP ARTHROPLASTY Left 07/24/2013   Procedure: LEFT TOTAL HIP ARTHROPLASTY ANTERIOR APPROACH;  Surgeon: Mcarthur Rossetti, Little;  Location: [facility];  Service: Orthopedics;  Laterality: Left;    Current Outpatient Medications    Medication Sig Dispense Refill  . amLODipine (NORVASC) 5 MG tablet Take 5 mg by mouth every morning.    Marland Kitchen atorvastatin (LIPITOR) 10 MG tablet TAKE 1 TABLET BY MOUTH EVERY DAY 90 tablet 3  . D3-50 1.25 MG (50000 UT) capsule ONE PILL EVERY WEEK    . HYDROcodone-Acetaminophen 7.5-300 MG TABS Take 1 tablet by mouth every 8 (eight) hours as needed. 30 tablet 0  . ofloxacin (OCUFLOX) 0.3 % ophthalmic solution INSTILL 1 DROP INTO RIGHT EYE FOUR TIMES A DAY STARTING ONE DAY BEFORE SURGERY     No current facility-administered medications for this visit.   Allergies:  Amphetamine and Ibuprofen   Social History: The patient  reports that she quit smoking about 6 years ago. Her smoking use included cigarettes. She has a 20.00 pack-year smoking history. She has never used smokeless tobacco. She reports that she does not drink alcohol or use drugs.   Family History: The patient's family history includes Cancer in her brother; Diabetes in her father; Heart attack in her mother.   ROS:   Chronic leg pain.   Physical Exam: VS:  BP (!) 144/80   Pulse 77   Temp (!) 96.5 F (35.8 C)   Ht 5\' 9"  (1.753 m)   Wt 207 lb (93.9 kg)   SpO2 95%   BMI 30.57 kg/m , BMI Body mass index is 30.57 kg/m.  Wt Readings from Last 3 Encounters:  12/17/19 207  lb (93.9 kg)  10/07/18 204 lb (92.5 kg)  06/24/18 204 lb (92.5 kg)    General: Elderly woman, appears comfortable at rest. HEENT: Conjunctiva and lids normal, wearing a mask. Neck: Supple, no elevated JVP or carotid bruits, no thyromegaly. Lungs: Clear to auscultation, nonlabored breathing at rest. Cardiac: Regular rate and rhythm, no S3, 2-3/6 systolic murmur, no pericardial rub. Abdomen: Soft, nontender, bowel sounds present. Extremities: No pitting edema, distal pulses 1-2+. Skin: Warm and dry. Musculoskeletal: No kyphosis. Neuropsychiatric: Alert and oriented x3, affect grossly appropriate.  ECG:  An ECG dated 04/11/2017 was personally reviewed today  and demonstrated:  Sinus rhythm with left bundle branch block, nonconducted PAC, PVC.  Recent Labwork: 09/18/2019: ALT 25; AST 53; BUN 21; Creatinine, Ser 1.10; Hemoglobin 12.1; Platelets 189; Potassium 3.9; Sodium 138  January 2021: Magnesium 2.0, BUN 16, creatinine 0.8, potassium 4.3, AST 30, ALT 18, hemoglobin 12.1, platelets 381  Other Studies Reviewed Today:  Lexiscan Myoview 02/11/2013: IMPRESSION: Negative pharmacologic stress nuclear myocardial study revealing no significant stress-induced EKG abnormalities, normal left ventricular size, normal left ventricular systolic function and normal myocardial perfusion except for the presence of breast and diaphragmatic attenuation.  No convincing evidence for ischemia or infarction.  Other findings as noted.  Lower extremity ABIs 10/07/2018: Summary:  Right: Resting right ankle-brachial index indicates moderate right lower  extremity arterial disease. The right toe-brachial index is abnormal. RT  great toe pressure = 74 mmHg. PPG tracings appear dampened.   Left: Resting left ankle-brachial index indicates moderate left lower  extremity arterial disease. The left toe-brachial index is abnormal. LT  Great toe pressure = 93 mmHg. PPG tracings appear dampened.   Assessment and Plan:  1.  Dyspnea on exertion in an 84 year old woman with history of hypertension, hyperlipidemia, and PAD.  ECG reviewed and overall nonspecific.  Cardiac murmur noted in aortic position.  She underwent previous ischemic testing in 2014.  Plan is to proceed with a Lexiscan Myoview for follow-up ischemic assessment and also an echocardiogram to reevaluate cardiac structure and function.  2.  Essential hypertension, currently on Norvasc.  Systolic is in the 140s today.  Keep follow-up with PCP for further titration as needed.  3.  Mixed hyperlipidemia, on low-dose Lipitor.  Would aim for LDL 70 or less in light of history of PAD.  Keep follow-up with  PCP.  Medication Adjustments/Labs and Tests Ordered: Current medicines are reviewed at length with the patient today.  Concerns regarding medicines are outlined above.   Tests Ordered: Orders Placed This Encounter  Procedures  . NM Myocar Multi W/Spect W/Wall Motion / EF  . EKG 12-Lead  . ECHOCARDIOGRAM COMPLETE    Medication Changes: No orders of the defined types were placed in this encounter.   Disposition:  Follow up test results.  Signed, Jonelle Sidle, Little, [facility] 12/17/2019 9:48 AM    [facility] at [facility] [address] [place], [place] [address] Phone: [phone]; Fax: [phone]

## 2019-12-17 NOTE — Patient Instructions (Signed)
Medication Instructions:  Your physician recommends that you continue on your current medications as directed. Please refer to the Current Medication list given to you today.  *If you need a refill on your cardiac medications before your next appointment, please call your pharmacy*   Lab Work: None today If you have labs (blood work) drawn today and your tests are completely normal, you will receive your results only by: . MyChart Message (if you have MyChart) OR . A paper copy in the mail If you have any lab test that is abnormal or we need to change your treatment, we will call you to review the results.   Testing/Procedures: Your physician has requested that you have an echocardiogram. Echocardiography is a painless test that uses sound waves to create images of your heart. It provides your doctor with information about the size and shape of your heart and how well your heart's chambers and valves are working. This procedure takes approximately one hour. There are no restrictions for this procedure.  Your physician has requested that you have a lexiscan myoview. For further information please visit www.[email or web address]. Please follow instruction sheet, as given.     Follow-Up: At [facility], you and your health needs are our priority.  As part of our continuing mission to provide you with exceptional heart care, we have created designated Provider Care Teams.  These Care Teams include your primary Cardiologist (physician) and Advanced Practice Providers (APPs -  Physician Assistants and Nurse Practitioners) who all work together to provide you with the care you need, when you need it.  We recommend signing up for the patient portal called "MyChart".  Sign up information is provided on this After Visit Summary.  MyChart is used to connect with patients for Virtual Visits (Telemedicine).  Patients are able to view lab/test results, encounter notes, upcoming appointments, etc.  Non-urgent  messages can be sent to your provider as well.   To learn more about what you can do with MyChart, go to [email or web address].    Your next appointment:  We will call you with results.     Thank you for choosing [facility] !        

## 2019-12-21 ENCOUNTER — Other Ambulatory Visit: Payer: Self-pay

## 2019-12-21 ENCOUNTER — Ambulatory Visit: Payer: Medicare Other | Admitting: Orthotics

## 2019-12-21 DIAGNOSIS — I739 Peripheral vascular disease, unspecified: Secondary | ICD-10-CM

## 2019-12-21 DIAGNOSIS — G5792 Unspecified mononeuropathy of left lower limb: Secondary | ICD-10-CM

## 2019-12-21 NOTE — Progress Notes (Signed)

## 2019-12-22 ENCOUNTER — Encounter (HOSPITAL_COMMUNITY): Payer: Medicare Other

## 2019-12-23 ENCOUNTER — Encounter: Payer: Medicare Other | Admitting: Physical Therapy

## 2019-12-29 ENCOUNTER — Other Ambulatory Visit: Payer: Self-pay

## 2019-12-29 ENCOUNTER — Ambulatory Visit (HOSPITAL_COMMUNITY)
Admission: RE | Admit: 2019-12-29 | Discharge: 2019-12-29 | Disposition: A | Discharge status: Home / Self Care | Payer: Medicare Other | Source: Ambulatory Visit | Attending: Cardiology | Admitting: Cardiology

## 2019-12-29 ENCOUNTER — Encounter (HOSPITAL_COMMUNITY): Payer: Self-pay

## 2019-12-29 ENCOUNTER — Encounter (HOSPITAL_COMMUNITY)
Admission: RE | Admit: 2019-12-29 | Discharge: 2019-12-29 | Disposition: A | Discharge status: Home / Self Care | Payer: Medicare Other | Source: Ambulatory Visit | Attending: Cardiology | Admitting: Cardiology

## 2019-12-29 ENCOUNTER — Ambulatory Visit (HOSPITAL_BASED_OUTPATIENT_CLINIC_OR_DEPARTMENT_OTHER)
Admission: RE | Admit: 2019-12-29 | Discharge: 2019-12-29 | Disposition: A | Discharge status: Home / Self Care | Payer: Medicare Other | Source: Ambulatory Visit | Attending: Cardiology | Admitting: Cardiology

## 2019-12-29 DIAGNOSIS — R0609 Other forms of dyspnea: Secondary | ICD-10-CM

## 2019-12-29 DIAGNOSIS — R011 Cardiac murmur, unspecified: Secondary | ICD-10-CM | POA: Diagnosis not present

## 2019-12-29 DIAGNOSIS — R0600 Dyspnea, unspecified: Secondary | ICD-10-CM | POA: Insufficient documentation

## 2019-12-29 LAB — NM MYOCAR MULTI W/SPECT W/WALL MOTION / EF
LV dias vol: 118 mL (ref 46–106)
LV sys vol: 57 mL
Peak HR: 72 {beats}/min
RATE: 0.34
Rest HR: 50 {beats}/min
SDS: 2
SRS: 4
SSS: 6
TID: 1.02

## 2019-12-29 MED ORDER — SODIUM CHLORIDE FLUSH 0.9 % IV SOLN
INTRAVENOUS | Status: AC
  Administered 2019-12-29: 10 mL via INTRAVENOUS
  Filled 2019-12-29: qty 10

## 2019-12-29 MED ORDER — TECHNETIUM TC 99M TETROFOSMIN IV KIT
30.0000 | PACK | Freq: Once | INTRAVENOUS | Status: AC | PRN
  Administered 2019-12-29: 30 via INTRAVENOUS

## 2019-12-29 MED ORDER — TECHNETIUM TC 99M TETROFOSMIN IV KIT
10.0000 | PACK | Freq: Once | INTRAVENOUS | Status: AC | PRN
  Administered 2019-12-29: 11 via INTRAVENOUS

## 2019-12-29 MED ORDER — REGADENOSON 0.4 MG/5ML IV SOLN
INTRAVENOUS | Status: AC
  Administered 2019-12-29: 0.4 mg via INTRAVENOUS
  Filled 2019-12-29: qty 5

## 2019-12-29 NOTE — Progress Notes (Signed)
*  PRELIMINARY RESULTS* Echocardiogram 2D Echocardiogram has been performed.  [name] 12/29/2019, 1:17 PM

## 2020-03-11 DIAGNOSIS — M205X9 Other deformities of toe(s) (acquired), unspecified foot: Secondary | ICD-10-CM | POA: Insufficient documentation

## 2020-06-22 ENCOUNTER — Other Ambulatory Visit (HOSPITAL_COMMUNITY): Payer: Self-pay | Admitting: Family

## 2020-06-22 DIAGNOSIS — M549 Dorsalgia, unspecified: Secondary | ICD-10-CM

## 2020-07-15 ENCOUNTER — Ambulatory Visit (HOSPITAL_COMMUNITY)
Admission: RE | Admit: 2020-07-15 | Discharge: 2020-07-15 | Disposition: A | Discharge status: Home / Self Care | Payer: Medicare Other | Source: Ambulatory Visit | Attending: Family | Admitting: Family

## 2020-07-15 ENCOUNTER — Other Ambulatory Visit: Payer: Self-pay

## 2020-07-15 DIAGNOSIS — M549 Dorsalgia, unspecified: Secondary | ICD-10-CM | POA: Insufficient documentation

## 2020-08-03 ENCOUNTER — Ambulatory Visit: Payer: Self-pay

## 2020-08-03 ENCOUNTER — Encounter: Payer: Self-pay | Admitting: Orthopaedic Surgery

## 2020-08-03 ENCOUNTER — Ambulatory Visit (INDEPENDENT_AMBULATORY_CARE_PROVIDER_SITE_OTHER): Payer: Medicare Other | Admitting: Orthopaedic Surgery

## 2020-08-03 ENCOUNTER — Ambulatory Visit: Payer: Medicare Other

## 2020-08-03 DIAGNOSIS — M545 Low back pain, unspecified: Secondary | ICD-10-CM | POA: Diagnosis not present

## 2020-08-03 DIAGNOSIS — M25571 Pain in right ankle and joints of right foot: Secondary | ICD-10-CM

## 2020-08-03 MED ORDER — PREDNISONE 5 MG (21) PO TBPK: ORAL_TABLET | ORAL | 0 refills | Status: DC

## 2020-08-03 NOTE — Progress Notes (Signed)
Office Visit Note   Patient: Kiara Little           Date of Birth: 27-Jul-1936           MRN: [id number] Visit Date: 08/03/2020              Requested by: Raymon Mutton., FNP [address] [place],  [place] [address] PCP: Raymon Mutton., FNP   Assessment & Plan: Visit Diagnoses:  1. Pain in right ankle and joints of right foot   2. Low back pain, unspecified back pain laterality, unspecified chronicity, unspecified whether sciatica present     Plan: Impression is right foot callus over the third metatarsal head causing lateral column overload. #2 chronic left lower back pain. In regards to the right foot, we have provided her with a doughnut to wear for comfort. We have also referred her to Dr. Lajoyce Corners for further evaluation treatment recommendation. In regards to her lower back, have called in a Sterapred taper to use for now. We'll also refer her to Dr. Conchita Paris for further evaluation and treatment recommendation. Follow-up with [place] as needed.  Follow-Up Instructions: Return if symptoms worsen or fail to improve.   Orders:  Orders Placed This Encounter  Procedures  . XR Foot Complete Right  . XR Lumbar Spine 2-3 Views  . Ambulatory referral to Neurosurgery   Meds ordered this encounter  Medications  . predniSONE (STERAPRED UNI-PAK 21 TAB) 5 MG (21) TBPK tablet    Sig: Take as directed    Dispense:  21 tablet    Refill:  0      Procedures: No procedures performed   Clinical Data: No additional findings.   Subjective: Chief Complaint  Patient presents with  . Right Foot - Pain  . Lower Back - Pain    HPI patient is a very pleasant 84 year old female who comes in today with right foot and left lower back pain. In regards to her right foot, she has had pain here for the past year. It appears that she is having pain primarily to the plantar surface of the foot right at the third metatarsal head which is formed a callus. This is causing her to supinate her foot  while walking. She has been seen at [facility] where she was recommended to wear certain type of shoe which she has been wearing. She does not notice much improvement wearing the shoes. In regards to her back, believe she has been walking with an altered gait due to her foot which is aggravated her left lower back. The pain does not radiate down either leg. She has a history of peripheral neuropathy but denies any new numbness, tingling or burning. No bowel or bladder dysfunction. She has a history of what sounds like lumbar fusion by Dr. Newell Coral several years ago which did seem to help. She has tried several courses of formal physical therapy in the past which helps until her sessions run out. Her pain then returns. She has been working on a home exercise program which does not seem to help.  Review of Systems as detailed in HPI. All others reviewed and are negative.   Objective: Vital Signs: There were no vitals taken for this visit.  Physical Exam well-developed well-nourished female in no acute distress. Alert and oriented x3.  Ortho Exam right foot exam shows a callus to the plantar surface along the third metatarsal head. This is tender. No drainage. No signs of infection or  cellulitis. She also has moderate tenderness along the fifth metatarsal shaft. Lumbar spine shows mild lower lumbar spinous and paraspinous tenderness. Positive straight leg raise. No focal weakness. Increased pain with lumbar extension. She is neurovascular intact distally.  Specialty Comments:  No specialty comments available.  Imaging: XR Foot Complete Right  Result Date: 08/03/2020 No acute abnormalities.  Generalized osteopenia.   XR Lumbar Spine 2-3 Views  Result Date: 08/03/2020 Straightening of lumbar spine with widespread spondylosis.  No acute abnormalities.  Multilevel degenerative disc disease.    PMFS History: Patient Active Problem List   Diagnosis Date Noted  . Dry eyes 03/25/2019    . Atherosclerosis of native arteries of extremity with intermittent claudication (HCC) 11/13/2017  . PAD (peripheral artery disease) (HCC) 07/26/2015  . Pain in joint, lower leg 08/02/2014  . Stiffness of joint, not elsewhere classified, pelvic region and thigh 04/30/2014  . Muscle weakness (generalized) 04/30/2014  . Degenerative arthritis of left hip 07/17/2013  . HTN (hypertension) 02/05/2013  . Chest pain, atypical 02/05/2013  . Anxiety 02/05/2013  . Nicotine abuse 02/05/2013   Past Medical History:  Diagnosis Date  . Chronic back pain   . COPD (chronic obstructive pulmonary disease) (HCC)   . COVID-06 October 2019  . Hyperlipidemia   . Hypertension   . Muscle spasm   . Noncompliance   . Osteoarthritis    a. s/p R THA  . PAD (peripheral artery disease) (HCC)     Family History  Problem Relation Age of Onset  . Heart attack Mother   . Diabetes Father   . Cancer Brother     Past Surgical History:  Procedure Laterality Date  . BACK SURGERY    . Cataract surgeries Right 07/2019  . JOINT REPLACEMENT  2000   RIGHT TOTAL HIP ARTHROPLASTY  . SURGERY FOR TUBAL PREGNANCY    . TOTAL HIP ARTHROPLASTY Left 07/24/2013   Procedure: LEFT TOTAL HIP ARTHROPLASTY ANTERIOR APPROACH;  Surgeon: Kathryne Hitch, MD;  Location: [facility];  Service: Orthopedics;  Laterality: Left;   Social History   Occupational History  . Not on file  Tobacco Use  . Smoking status: Former Smoker    Packs/day: 0.50    Years: 40.00    Pack years: 20.00    Types: Cigarettes    Quit date: 07/24/2013    Years since quitting: 7.0  . Smokeless tobacco: Never Used  Vaping Use  . Vaping Use: Never used  Substance and Sexual Activity  . Alcohol use: No  . Drug use: No  . Sexual activity: Not on file

## 2020-08-04 ENCOUNTER — Encounter: Payer: Self-pay | Admitting: Orthopedic Surgery

## 2020-08-04 ENCOUNTER — Ambulatory Visit (INDEPENDENT_AMBULATORY_CARE_PROVIDER_SITE_OTHER): Payer: Medicare Other | Admitting: Orthopedic Surgery

## 2020-08-04 DIAGNOSIS — M7741 Metatarsalgia, right foot: Secondary | ICD-10-CM

## 2020-08-04 DIAGNOSIS — M6701 Short Achilles tendon (acquired), right ankle: Secondary | ICD-10-CM | POA: Diagnosis not present

## 2020-08-04 NOTE — Progress Notes (Signed)
Office Visit Note   Patient: Kiara Little           Date of Birth: 30-Jul-1936           MRN: [id number] Visit Date: 08/04/2020              Requested by: Raymon Mutton., FNP [address] [place],  [place] [address] PCP: Raymon Mutton., FNP  Chief Complaint  Patient presents with  . Right Foot - Pain      HPI: Patient is an 84 year old woman who was seen for evaluation for a callus and bony prominence beneath the third metatarsal head.  Patient has had a felt relieving donut placed in her orthotics.  Assessment & Plan: Visit Diagnoses:  1. Achilles tendon contracture, right   2. Metatarsalgia, right foot     Plan: Recommend Achilles stretching to unload the forefoot recommended a [place] sneaker and this was written down for her recommended a multiple B vitamin.  Follow-up as needed to debride the callus if it reoccurs.  Follow-Up Instructions: Return if symptoms worsen or fail to improve.   Ortho Exam  Patient is alert, oriented, no adenopathy, well-dressed, normal affect, normal respiratory effort. Examination patient has posterior tibial tendon insufficiency with pronation and valgus of the right foot she has a palpable pulse.  She has prominent metatarsal heads with distal migration of the fat pad and status post claw toe surgery to the lesser toes.  She has a prominent third metatarsal head with a callus.  After informed consent a 10 blade knife was used to pare the callus there is no deep abscess no signs of infection.  Patient also has a kyphosis with standing.  Patient complains of tightness in her forefoot at the end of the day this is most likely secondary to the increased pressure from the prominent metatarsal heads Achilles contracture and nonsupportive shoe.  Imaging: No results found. No images are attached to the encounter.  Labs: No results found for: HGBA1C, ESRSEDRATE, CRP, LABURIC, REPTSTATUS, GRAMSTAIN, CULT, LABORGA   Lab Results  Component Value  Date   ALBUMIN 3.5 09/18/2019   ALBUMIN 3.8 02/05/2013    No results found for: MG No results found for: VD25OH  No results found for: PREALBUMIN CBC EXTENDED Latest Ref Rng & Units 09/18/2019 04/11/2017 07/27/2013  WBC 4.0 - 10.5 K/uL 5.6 7.6 11.0(H)  RBC 3.87 - 5.11 MIL/uL 4.25 4.29 3.30(L)  HGB 12.0 - 15.0 g/dL 51.0 25.8 5.2(D)  HCT 36 - 46 % 39.5 39.0 30.4(L)  PLT 150 - 400 K/uL 189 259 221     There is no height or weight on file to calculate BMI.  Orders:  No orders of the defined types were placed in this encounter.  No orders of the defined types were placed in this encounter.    Procedures: No procedures performed  Clinical Data: No additional findings.  ROS:  All other systems negative, except as noted in the HPI. Review of Systems  Objective: Vital Signs: There were no vitals taken for this visit.  Specialty Comments:  No specialty comments available.  PMFS History: Patient Active Problem List   Diagnosis Date Noted  . Dry eyes 03/25/2019  . Atherosclerosis of native arteries of extremity with intermittent claudication (HCC) 11/13/2017  . PAD (peripheral artery disease) (HCC) 07/26/2015  . Pain in joint, lower leg 08/02/2014  . Stiffness of joint, not elsewhere classified, pelvic region and thigh 04/30/2014  . Muscle weakness (generalized)  04/30/2014  . Degenerative arthritis of left hip 07/17/2013  . HTN (hypertension) 02/05/2013  . Chest pain, atypical 02/05/2013  . Anxiety 02/05/2013  . Nicotine abuse 02/05/2013   Past Medical History:  Diagnosis Date  . Chronic back pain   . COPD (chronic obstructive pulmonary disease) (HCC)   . COVID-06 October 2019  . Hyperlipidemia   . Hypertension   . Muscle spasm   . Noncompliance   . Osteoarthritis    a. s/p R THA  . PAD (peripheral artery disease) (HCC)     Family History  Problem Relation Age of Onset  . Heart attack Mother   . Diabetes Father   . Cancer Brother     Past Surgical  History:  Procedure Laterality Date  . BACK SURGERY    . Cataract surgeries Right 07/2019  . JOINT REPLACEMENT  2000   RIGHT TOTAL HIP ARTHROPLASTY  . SURGERY FOR TUBAL PREGNANCY    . TOTAL HIP ARTHROPLASTY Left 07/24/2013   Procedure: LEFT TOTAL HIP ARTHROPLASTY ANTERIOR APPROACH;  Surgeon: Kathryne Hitch, MD;  Location: [facility];  Service: Orthopedics;  Laterality: Left;   Social History   Occupational History  . Not on file  Tobacco Use  . Smoking status: Former Smoker    Packs/day: 0.50    Years: 40.00    Pack years: 20.00    Types: Cigarettes    Quit date: 07/24/2013    Years since quitting: 7.0  . Smokeless tobacco: Never Used  Vaping Use  . Vaping Use: Never used  Substance and Sexual Activity  . Alcohol use: No  . Drug use: No  . Sexual activity: Not on file

## 2020-08-08 DIAGNOSIS — M545 Low back pain, unspecified: Secondary | ICD-10-CM | POA: Insufficient documentation

## 2020-08-08 DIAGNOSIS — Z6831 Body mass index (BMI) 31.0-31.9, adult: Secondary | ICD-10-CM | POA: Insufficient documentation

## 2020-08-08 DIAGNOSIS — G8929 Other chronic pain: Secondary | ICD-10-CM | POA: Insufficient documentation

## 2020-08-10 ENCOUNTER — Other Ambulatory Visit (HOSPITAL_COMMUNITY): Payer: Self-pay | Admitting: Physician Assistant

## 2020-08-10 DIAGNOSIS — M545 Low back pain, unspecified: Secondary | ICD-10-CM

## 2020-08-24 ENCOUNTER — Ambulatory Visit (HOSPITAL_COMMUNITY)
Admission: RE | Admit: 2020-08-24 | Discharge: 2020-08-24 | Disposition: A | Discharge status: Home / Self Care | Payer: Medicare Other | Source: Ambulatory Visit | Attending: Physician Assistant | Admitting: Physician Assistant

## 2020-08-24 ENCOUNTER — Other Ambulatory Visit: Payer: Self-pay

## 2020-08-24 DIAGNOSIS — M545 Low back pain, unspecified: Secondary | ICD-10-CM | POA: Insufficient documentation

## 2020-08-24 DIAGNOSIS — G8929 Other chronic pain: Secondary | ICD-10-CM | POA: Diagnosis present

## 2020-10-26 DIAGNOSIS — M47816 Spondylosis without myelopathy or radiculopathy, lumbar region: Secondary | ICD-10-CM | POA: Insufficient documentation

## 2020-12-02 ENCOUNTER — Other Ambulatory Visit: Payer: Self-pay | Admitting: Vascular Surgery

## 2020-12-22 ENCOUNTER — Other Ambulatory Visit: Payer: Self-pay

## 2020-12-22 DIAGNOSIS — I70213 Atherosclerosis of native arteries of extremities with intermittent claudication, bilateral legs: Secondary | ICD-10-CM

## 2020-12-27 DIAGNOSIS — M79671 Pain in right foot: Secondary | ICD-10-CM | POA: Insufficient documentation

## 2020-12-27 DIAGNOSIS — L603 Nail dystrophy: Secondary | ICD-10-CM | POA: Insufficient documentation

## 2021-01-09 ENCOUNTER — Other Ambulatory Visit: Payer: Self-pay | Admitting: Family

## 2021-01-09 DIAGNOSIS — R748 Abnormal levels of other serum enzymes: Secondary | ICD-10-CM

## 2021-01-10 ENCOUNTER — Encounter: Payer: Self-pay | Admitting: Vascular Surgery

## 2021-01-10 ENCOUNTER — Ambulatory Visit (HOSPITAL_COMMUNITY)
Admission: RE | Admit: 2021-01-10 | Discharge: 2021-01-10 | Disposition: A | Discharge status: Home / Self Care | Payer: Medicare Other | Source: Ambulatory Visit | Attending: Vascular Surgery | Admitting: Vascular Surgery

## 2021-01-10 ENCOUNTER — Ambulatory Visit (INDEPENDENT_AMBULATORY_CARE_PROVIDER_SITE_OTHER): Payer: Medicare Other | Admitting: Vascular Surgery

## 2021-01-10 ENCOUNTER — Other Ambulatory Visit: Payer: Self-pay

## 2021-01-10 VITALS — BP 132/74 | HR 63 | Temp 97.2°F | Resp 16 | Ht 69.0 in | Wt 211.0 lb

## 2021-01-10 DIAGNOSIS — I70213 Atherosclerosis of native arteries of extremities with intermittent claudication, bilateral legs: Secondary | ICD-10-CM | POA: Diagnosis not present

## 2021-01-10 DIAGNOSIS — I739 Peripheral vascular disease, unspecified: Secondary | ICD-10-CM

## 2021-01-10 NOTE — Progress Notes (Signed)
Patient name: Kiara Little MRN: [id number] DOB: Jul 08, 1936 Sex: female  REASON FOR VISIT: Follow-up for PAD  HPI: Kiara Little is a 85 y.o. female with history of hypertension and hyperlipidemia who presents for follow-up of her PAD.  Unfortunately she has developed a wound on her right great toe after she had a toenail removed for fungus approximately 3 weeks ago.  She states it has been greater than 3 weeks and this has been nonhealing.  She has previously been seen for bilateral lower extremity leg swelling and also claudication symptoms with moderate PAD.  She has had no prior vascular surgery intervention.  She denies tobacco abuse.  Past Medical History:  Diagnosis Date  . Chronic back pain   . COPD (chronic obstructive pulmonary disease) (HCC)   . COVID-06 October 2019  . Hyperlipidemia   . Hypertension   . Muscle spasm   . Noncompliance   . Osteoarthritis    a. s/p R THA  . PAD (peripheral artery disease) (HCC)     Past Surgical History:  Procedure Laterality Date  . BACK SURGERY    . Cataract surgeries Right 07/2019  . JOINT REPLACEMENT  2000   RIGHT TOTAL HIP ARTHROPLASTY  . SURGERY FOR TUBAL PREGNANCY    . TOTAL HIP ARTHROPLASTY Left 07/24/2013   Procedure: LEFT TOTAL HIP ARTHROPLASTY ANTERIOR APPROACH;  Surgeon: Kathryne Hitch, MD;  Location: [facility];  Service: Orthopedics;  Laterality: Left;    Family History  Problem Relation Age of Onset  . Heart attack Mother   . Diabetes Father   . Cancer Brother     SOCIAL HISTORY: Social History   Tobacco Use  . Smoking status: Former Smoker    Packs/day: 0.50    Years: 40.00    Pack years: 20.00    Types: Cigarettes    Quit date: 07/24/2013    Years since quitting: 7.4  . Smokeless tobacco: Never Used  Substance Use Topics  . Alcohol use: No    Allergies  Allergen Reactions  . Amphetamine Itching  . Ibuprofen Hives and Rash    Pt states "It makes me break out on my backside"    Current  Outpatient Medications  Medication Sig Dispense Refill  . amLODipine (NORVASC) 5 MG tablet Take 5 mg by mouth every morning.    . D3-50 1.25 MG (50000 UT) capsule ONE PILL EVERY WEEK    . HYDROcodone-Acetaminophen 7.5-300 MG TABS Take 1 tablet by mouth every 8 (eight) hours as needed. 30 tablet 0  . ofloxacin (OCUFLOX) 0.3 % ophthalmic solution INSTILL 1 DROP INTO RIGHT EYE FOUR TIMES A DAY STARTING ONE DAY BEFORE SURGERY    . predniSONE (STERAPRED UNI-PAK 21 TAB) 5 MG (21) TBPK tablet Take as directed 21 tablet 0  . atorvastatin (LIPITOR) 10 MG tablet TAKE 1 TABLET BY MOUTH EVERY DAY (Patient not taking: Reported on 01/10/2021) 90 tablet 3   No current facility-administered medications for this visit.    REVIEW OF SYSTEMS:  [X]  denotes positive finding, [ ]  denotes negative finding Cardiac  Comments:  Chest pain or chest pressure:    Shortness of breath upon exertion:    Short of breath when lying flat:    Irregular heart rhythm:        Vascular    Pain in calf, thigh, or hip brought on by ambulation:    Pain in feet at night that wakes you up from your sleep:  Blood clot in your veins:    Leg swelling:         Pulmonary    Oxygen at home:    Productive cough:     Wheezing:         Neurologic    Sudden weakness in arms or legs:     Sudden numbness in arms or legs:     Sudden onset of difficulty speaking or slurred speech:    Temporary loss of vision in one eye:     Problems with dizziness:         Gastrointestinal    Blood in stool:     Vomited blood:         Genitourinary    Burning when urinating:     Blood in urine:        Psychiatric    Major depression:         Hematologic    Bleeding problems:    Problems with blood clotting too easily:        Skin    Rashes or ulcers:        Constitutional    Fever or chills:      PHYSICAL EXAM: Vitals:   01/10/21 1346  BP: 132/74  Pulse: 63  Resp: 16  Temp: (!) 97.2 F (36.2 C)  TempSrc: Temporal  SpO2:  93%  Weight: 211 lb (95.7 kg)  Height: 5\' 9"  (1.753 m)    GENERAL: The patient is a well-nourished female, in no acute distress. The vital signs are documented above. CARDIAC: There is a regular rate and rhythm.  VASCULAR:  2+ femoral pulse palpable bilateral groins No palpable pedal pulses Right great toe open wound since toenail removal as pictured below ABDOMEN: Soft and non-tender. MUSCULOSKELETAL: There are no major deformities or cyanosis. NEUROLOGIC: No focal weakness or paresthesias are detected. SKIN: There are no ulcers or rashes noted. PSYCHIATRIC: The patient has a normal affect.      DATA:   ABI's 0.57 right and monophasic while 0.72 left and monophasic.  Assessment/Plan:  85 year old female who presents for follow-up of her PAD.  Unfortunately she developed nonhealing wound on the right great toe where she had a toenail removed about 3 weeks ago.  Given her severely depressed ABI of 0.5 monophasic at the ankle, I doubt she has enough inflow to heal this at this time.  I have recommended aortogram with lower extremity arteriogram and possible intervention.  We discussed possibly needing bypass at a later date.  We will get her scheduled for the Cath Lab and risks benefits discussed   97, MD [facility] Office: [phone]  [phone]   Cephus Shelling

## 2021-01-11 ENCOUNTER — Other Ambulatory Visit (HOSPITAL_COMMUNITY)
Admission: RE | Admit: 2021-01-11 | Discharge: 2021-01-11 | Disposition: A | Discharge status: Home / Self Care | Payer: Medicare Other | Source: Ambulatory Visit | Attending: Vascular Surgery | Admitting: Vascular Surgery

## 2021-01-11 DIAGNOSIS — Z20822 Contact with and (suspected) exposure to covid-19: Secondary | ICD-10-CM | POA: Diagnosis not present

## 2021-01-11 DIAGNOSIS — Z01812 Encounter for preprocedural laboratory examination: Secondary | ICD-10-CM | POA: Diagnosis present

## 2021-01-11 LAB — SARS CORONAVIRUS 2 (TAT 6-24 HRS): SARS Coronavirus 2: NEGATIVE

## 2021-01-12 ENCOUNTER — Other Ambulatory Visit: Payer: Self-pay

## 2021-01-12 ENCOUNTER — Ambulatory Visit (HOSPITAL_COMMUNITY)
Admission: RE | Admit: 2021-01-12 | Discharge: 2021-01-12 | Disposition: A | Discharge status: Home / Self Care | Payer: Medicare Other | Attending: Vascular Surgery | Admitting: Vascular Surgery

## 2021-01-12 ENCOUNTER — Encounter (HOSPITAL_COMMUNITY)
Admission: RE | Disposition: A | Discharge status: Home / Self Care | Payer: Self-pay | Source: Home / Self Care | Attending: Vascular Surgery

## 2021-01-12 DIAGNOSIS — Z886 Allergy status to analgesic agent status: Secondary | ICD-10-CM | POA: Diagnosis not present

## 2021-01-12 DIAGNOSIS — Z87891 Personal history of nicotine dependence: Secondary | ICD-10-CM | POA: Insufficient documentation

## 2021-01-12 DIAGNOSIS — I998 Other disorder of circulatory system: Secondary | ICD-10-CM

## 2021-01-12 DIAGNOSIS — E785 Hyperlipidemia, unspecified: Secondary | ICD-10-CM | POA: Insufficient documentation

## 2021-01-12 DIAGNOSIS — Z79899 Other long term (current) drug therapy: Secondary | ICD-10-CM | POA: Insufficient documentation

## 2021-01-12 DIAGNOSIS — L97519 Non-pressure chronic ulcer of other part of right foot with unspecified severity: Secondary | ICD-10-CM | POA: Insufficient documentation

## 2021-01-12 DIAGNOSIS — I70235 Atherosclerosis of native arteries of right leg with ulceration of other part of foot: Secondary | ICD-10-CM | POA: Insufficient documentation

## 2021-01-12 DIAGNOSIS — I10 Essential (primary) hypertension: Secondary | ICD-10-CM | POA: Diagnosis not present

## 2021-01-12 HISTORY — PX: PERIPHERAL VASCULAR BALLOON ANGIOPLASTY: CATH118281

## 2021-01-12 HISTORY — PX: ABDOMINAL AORTOGRAM W/LOWER EXTREMITY: CATH118223

## 2021-01-12 LAB — POCT I-STAT, CHEM 8
BUN: 13 mg/dL (ref 8–23)
Calcium, Ion: 1.22 mmol/L (ref 1.15–1.40)
Chloride: 103 mmol/L (ref 98–111)
Creatinine, Ser: 0.8 mg/dL (ref 0.44–1.00)
Glucose, Bld: 99 mg/dL (ref 70–99)
HCT: 38 % (ref 36.0–46.0)
Hemoglobin: 12.9 g/dL (ref 12.0–15.0)
Potassium: 3.5 mmol/L (ref 3.5–5.1)
Sodium: 145 mmol/L (ref 135–145)
TCO2: 28 mmol/L (ref 22–32)

## 2021-01-12 SURGERY — ABDOMINAL AORTOGRAM W/LOWER EXTREMITY
Anesthesia: LOCAL | Laterality: Right

## 2021-01-12 MED ORDER — CLOPIDOGREL BISULFATE 75 MG PO TABS: 300.0000 mg | ORAL_TABLET | Freq: Once | ORAL | Status: DC

## 2021-01-12 MED ORDER — HEPARIN SODIUM (PORCINE) 1000 UNIT/ML IJ SOLN
INTRAMUSCULAR | Status: DC | PRN
  Administered 2021-01-12: 10000 [IU] via INTRAVENOUS

## 2021-01-12 MED ORDER — IODIXANOL 320 MG/ML IV SOLN
INTRAVENOUS | Status: DC | PRN
  Administered 2021-01-12: 170 mL

## 2021-01-12 MED ORDER — ASPIRIN EC 81 MG PO TBEC
81.0000 mg | DELAYED_RELEASE_TABLET | Freq: Every day | ORAL | Status: DC
  Filled 2021-01-12: qty 1

## 2021-01-12 MED ORDER — SODIUM CHLORIDE 0.9 % IV SOLN: INTRAVENOUS | Status: DC

## 2021-01-12 MED ORDER — LIDOCAINE HCL (PF) 1 % IJ SOLN
INTRAMUSCULAR | Status: AC
  Filled 2021-01-12: qty 30

## 2021-01-12 MED ORDER — HEPARIN (PORCINE) IN NACL 1000-0.9 UT/500ML-% IV SOLN
INTRAVENOUS | Status: AC
  Filled 2021-01-12: qty 1000

## 2021-01-12 MED ORDER — SODIUM CHLORIDE 0.9% FLUSH: 3.0000 mL | INTRAVENOUS | Status: DC | PRN

## 2021-01-12 MED ORDER — SODIUM CHLORIDE 0.9% FLUSH: 3.0000 mL | Freq: Two times a day (BID) | INTRAVENOUS | Status: DC

## 2021-01-12 MED ORDER — MIDAZOLAM HCL 2 MG/2ML IJ SOLN
INTRAMUSCULAR | Status: DC | PRN
  Administered 2021-01-12 (×2): 1 mg via INTRAVENOUS

## 2021-01-12 MED ORDER — LIDOCAINE HCL (PF) 1 % IJ SOLN
INTRAMUSCULAR | Status: DC | PRN
  Administered 2021-01-12: 18 mg via INTRADERMAL
  Administered 2021-01-12: 3 mL via INTRADERMAL

## 2021-01-12 MED ORDER — SODIUM CHLORIDE 0.9 % IV SOLN: 250.0000 mL | INTRAVENOUS | Status: DC | PRN

## 2021-01-12 MED ORDER — ASPIRIN EC 81 MG PO TBEC: 81.0000 mg | DELAYED_RELEASE_TABLET | Freq: Every day | ORAL | 2 refills | Status: AC

## 2021-01-12 MED ORDER — FENTANYL CITRATE (PF) 100 MCG/2ML IJ SOLN
INTRAMUSCULAR | Status: DC | PRN
  Administered 2021-01-12 (×2): 25 ug via INTRAVENOUS
  Administered 2021-01-12: 50 ug via INTRAVENOUS

## 2021-01-12 MED ORDER — HEPARIN SODIUM (PORCINE) 1000 UNIT/ML IJ SOLN
INTRAMUSCULAR | Status: AC
  Filled 2021-01-12: qty 1

## 2021-01-12 MED ORDER — HYDRALAZINE HCL 20 MG/ML IJ SOLN: 5.0000 mg | INTRAMUSCULAR | Status: DC | PRN

## 2021-01-12 MED ORDER — ONDANSETRON HCL 4 MG/2ML IJ SOLN: 4.0000 mg | Freq: Four times a day (QID) | INTRAMUSCULAR | Status: DC | PRN

## 2021-01-12 MED ORDER — CLOPIDOGREL BISULFATE 75 MG PO TABS: 75.0000 mg | ORAL_TABLET | Freq: Every day | ORAL | Status: DC

## 2021-01-12 MED ORDER — ACETAMINOPHEN 325 MG PO TABS: 650.0000 mg | ORAL_TABLET | ORAL | Status: DC | PRN

## 2021-01-12 MED ORDER — CLOPIDOGREL BISULFATE 75 MG PO TABS: 75.0000 mg | ORAL_TABLET | Freq: Every day | ORAL | 11 refills | Status: DC

## 2021-01-12 MED ORDER — LABETALOL HCL 5 MG/ML IV SOLN: 10.0000 mg | INTRAVENOUS | Status: DC | PRN

## 2021-01-12 MED ORDER — HEPARIN (PORCINE) IN NACL 1000-0.9 UT/500ML-% IV SOLN
INTRAVENOUS | Status: DC | PRN
  Administered 2021-01-12 (×2): 500 mL

## 2021-01-12 MED ORDER — CLOPIDOGREL BISULFATE 300 MG PO TABS
ORAL_TABLET | ORAL | Status: AC
  Filled 2021-01-12: qty 1

## 2021-01-12 MED ORDER — CLOPIDOGREL BISULFATE 300 MG PO TABS
ORAL_TABLET | ORAL | Status: DC | PRN
  Administered 2021-01-12: 300 mg via ORAL

## 2021-01-12 MED ORDER — FENTANYL CITRATE (PF) 100 MCG/2ML IJ SOLN
INTRAMUSCULAR | Status: AC
  Filled 2021-01-12: qty 2

## 2021-01-12 MED ORDER — MIDAZOLAM HCL 2 MG/2ML IJ SOLN
INTRAMUSCULAR | Status: AC
  Filled 2021-01-12: qty 2

## 2021-01-12 SURGICAL SUPPLY — 22 items
BALLN STERLING OTW 3X220X150 (BALLOONS) ×3
BALLN STERLING OTW 5X20X135 (BALLOONS) ×3
BALLOON STERLING OTW 3X220X150 (BALLOONS) IMPLANT
BALLOON STERLING OTW 5X20X135 (BALLOONS) IMPLANT
CATH CROSS OVER TEMPO 5F (CATHETERS) ×1 IMPLANT
CATH CXI 2.6F 65 ST (CATHETERS) ×3
CATH OMNI FLUSH 5F 65CM (CATHETERS) ×1 IMPLANT
CATH SPRT STRG 65X2.6FR ACPT (CATHETERS) IMPLANT
DEVICE CLOSURE MYNXGRIP 5F (Vascular Products) ×1 IMPLANT
KIT ENCORE 26 ADVANTAGE (KITS) ×1 IMPLANT
KIT MICROPUNCTURE NIT STIFF (SHEATH) ×1 IMPLANT
KIT PV (KITS) ×3 IMPLANT
PATCH THROMBIX TOPICAL PLAIN (HEMOSTASIS) ×2 IMPLANT
SHEATH GLIDE SLENDER 4/5FR (SHEATH) ×1 IMPLANT
SHEATH MICROPUNCTURE PEDAL 4FR (SHEATH) ×1 IMPLANT
SHEATH PINNACLE 5F 10CM (SHEATH) ×1 IMPLANT
SHEATH PROBE COVER 6X72 (BAG) ×1 IMPLANT
SYR MEDRAD MARK V 150ML (SYRINGE) ×1 IMPLANT
TRANSDUCER W/STOPCOCK (MISCELLANEOUS) ×3 IMPLANT
TRAY PV CATH (CUSTOM PROCEDURE TRAY) ×3 IMPLANT
WIRE G V18X300CM (WIRE) ×3 IMPLANT
WIRE HITORQ VERSACORE ST 145CM (WIRE) ×1 IMPLANT

## 2021-01-12 NOTE — H&P (Signed)
History and Physical Interval Note:  01/12/2021 12:45 PM  Kiara Little  has presented today for surgery, with the diagnosis of pad.  The various methods of treatment have been discussed with the patient and family. After consideration of risks, benefits and other options for treatment, the patient has consented to  Procedure(s): ABDOMINAL AORTOGRAM W/LOWER EXTREMITY (N/A) as a surgical intervention.  The patient's history has been reviewed, patient examined, no change in status, stable for surgery.  I have reviewed the patient's chart and labs.  Questions were answered to the patient's satisfaction.    Aortogram, lower extremity arteriogram.  Right foot tissue loss.  Cephus Shelling  Patient name: Kiara Little            MRN: [id number]        DOB: [birth year]            Sex: female  REASON FOR VISIT: Follow-up for PAD  HPI: Kiara Little is a 85 y.o. female with history of hypertension and hyperlipidemia who presents for follow-up of her PAD.  Unfortunately she has developed a wound on her right great toe after she had a toenail removed for fungus approximately 3 weeks ago.  She states it has been greater than 3 weeks and this has been nonhealing.  She has previously been seen for bilateral lower extremity leg swelling and also claudication symptoms with moderate PAD.  She has had no prior vascular surgery intervention.  She denies tobacco abuse.      Past Medical History:  Diagnosis Date  . Chronic back pain   . COPD (chronic obstructive pulmonary disease) (HCC)   . COVID-06 October 2019  . Hyperlipidemia   . Hypertension   . Muscle spasm   . Noncompliance   . Osteoarthritis    a. s/p R THA  . PAD (peripheral artery disease) (HCC)          Past Surgical History:  Procedure Laterality Date  . BACK SURGERY    . Cataract surgeries Right 07/2019  . JOINT REPLACEMENT  2000   RIGHT TOTAL HIP ARTHROPLASTY  . SURGERY FOR TUBAL PREGNANCY    . TOTAL  HIP ARTHROPLASTY Left 07/24/2013   Procedure: LEFT TOTAL HIP ARTHROPLASTY ANTERIOR APPROACH;  Surgeon: Kathryne Hitch, MD;  Location: [facility];  Service: Orthopedics;  Laterality: Left;         Family History  Problem Relation Age of Onset  . Heart attack Mother   . Diabetes Father   . Cancer Brother     SOCIAL HISTORY: Social History        Tobacco Use  . Smoking status: Former Smoker    Packs/day: 0.50    Years: 40.00    Pack years: 20.00    Types: Cigarettes    Quit date: 07/24/2013    Years since quitting: 7.4  . Smokeless tobacco: Never Used  Substance Use Topics  . Alcohol use: No         Allergies  Allergen Reactions  . Amphetamine Itching  . Ibuprofen Hives and Rash    Pt states "It makes me break out on my backside"          Current Outpatient Medications  Medication Sig Dispense Refill  . amLODipine (NORVASC) 5 MG tablet Take 5 mg by mouth every morning.    . D3-50 1.25 MG (50000 UT) capsule ONE PILL EVERY WEEK    . HYDROcodone-Acetaminophen 7.5-300 MG TABS Take 1 tablet by  mouth every 8 (eight) hours as needed. 30 tablet 0  . ofloxacin (OCUFLOX) 0.3 % ophthalmic solution INSTILL 1 DROP INTO RIGHT EYE FOUR TIMES A DAY STARTING ONE DAY BEFORE SURGERY    . predniSONE (STERAPRED UNI-PAK 21 TAB) 5 MG (21) TBPK tablet Take as directed 21 tablet 0  . atorvastatin (LIPITOR) 10 MG tablet TAKE 1 TABLET BY MOUTH EVERY DAY (Patient not taking: Reported on 01/10/2021) 90 tablet 3   No current facility-administered medications for this visit.    REVIEW OF SYSTEMS:  [X]  denotes positive finding, [ ]  denotes negative finding Cardiac  Comments:  Chest pain or chest pressure:    Shortness of breath upon exertion:    Short of breath when lying flat:    Irregular heart rhythm:        Vascular    Pain in calf, thigh, or hip brought on by ambulation:    Pain in feet at night that wakes you up from your sleep:      Blood clot in your veins:    Leg swelling:         Pulmonary    Oxygen at home:    Productive cough:     Wheezing:         Neurologic    Sudden weakness in arms or legs:     Sudden numbness in arms or legs:     Sudden onset of difficulty speaking or slurred speech:    Temporary loss of vision in one eye:     Problems with dizziness:         Gastrointestinal    Blood in stool:     Vomited blood:         Genitourinary    Burning when urinating:     Blood in urine:        Psychiatric    Major depression:         Hematologic    Bleeding problems:    Problems with blood clotting too easily:        Skin    Rashes or ulcers:        Constitutional    Fever or chills:      PHYSICAL EXAM:    Vitals:   01/10/21 1346  BP: 132/74  Pulse: 63  Resp: 16  Temp: (!) 97.2 F (36.2 C)  TempSrc: Temporal  SpO2: 93%  Weight: 211 lb (95.7 kg)  Height: 5\' 9"  (1.753 m)    GENERAL: The patient is a well-nourished female, in no acute distress. The vital signs are documented above. CARDIAC: There is a regular rate and rhythm.  VASCULAR:  2+ femoral pulse palpable bilateral groins No palpable pedal pulses Right great toe open wound since toenail removal as pictured below ABDOMEN: Soft and non-tender. MUSCULOSKELETAL: There are no major deformities or cyanosis. NEUROLOGIC: No focal weakness or paresthesias are detected. SKIN: There are no ulcers or rashes noted. PSYCHIATRIC: The patient has a normal affect.      DATA:   ABI's 0.57 right and monophasic while 0.72 left and monophasic.  Assessment/Plan:  85 year old female who presents for follow-up of her PAD.  Unfortunately she developed nonhealing wound on the right great toe where she had a toenail removed about 3 weeks ago.  Given her severely depressed ABI of 0.5 monophasic at the ankle, I doubt she has enough inflow to heal this  at this time.  I have recommended aortogram with lower extremity arteriogram and possible intervention.  We discussed  possibly needing bypass at a later date.  We will get her scheduled for the Cath Lab and risks benefits discussed   Cephus Shelling, MD [facility] Office: [phone]  Cephus Shelling

## 2021-01-12 NOTE — Op Note (Signed)
Patient name: Kiara Little MRN: [id number] DOB: 21-Sep-1935 Sex: female  01/12/2021 Pre-operative Diagnosis: Critical limb ischemia of the right lower extremity with tissue loss Post-operative diagnosis:  Same Surgeon:  Cephus Shelling, MD Procedure Performed: 1.  Ultrasound-guided access of left common femoral artery 2.  Aortogram including catheter selection of aorta 3.  Ultrasound-guided access of right anterior tibial artery retrograde at the ankle 4.  Right anterior tibial artery angioplasty via retrograde right anterior tibial approach at the ankle(3 mm x 220 mm Sterling) 5.  Right SFA angioplasty via retrograde right anterior tibial approach at the ankle (5 mm x 20 mm Sterling) 6.  Mynx closure of the left common femoral artery 7.  105 minutes of monitored moderate conscious sedation time  Indications: Patient is a 85 year old female who has known history of PAD and was recently seen for a new wound on her right great toe after a toenail was removed for a fungus 3 weeks ago.  She presents today for lower extremity arteriogram and possible intervention to attempt limb salvage after risk-benefit discussed.  Findings:   Aortogram showed patent renal arteries bilaterally with an ectatic infrarenal aorta but no flow-limiting stenosis in the aortoiliac segment.  She does have high femoral bifurcation both groins.   Right lower extremity which is the side of interest showed a patent common femoral, profunda and proximal SFA.  There is a focal high-grade stenosis in the distal SFA that was greater than 75%.  Ultimately her above and below-knee popliteal artery was patent.  She had severe tibial disease and the tibial trifurcation is occluded.  She had only collaterals in the calf and on lateral foot shot we only identified an anterior tibial that reconstituted just above the ankle.  Left lower extremity also showed a patent but diseased SFA with a patent popliteal artery above and  below the knee and occluded tibial trifurcation with no runoff identified in the left lower extremity except collaterals.  Ultimately the only option was retrograde tibial access given she had no distal bypass target and only reconstituted anterior tibial just above the ankle.  This was evaluated ultrasound and with retrograde access I placed a pedal sheath in the right anterior tibial just above the ankle.  I was able to use a V 18 with a CXI catheter to cross the anterior tibial get back into the below-knee popliteal artery in the true lumen.  The entire anterior tibial was angioplastied with 3 mm balloon to nominal pressure.  There was a evidence of a dissection in the mid anterior tibial but this was a retrograde dissection and it did not appear flow-limiting.  The SFA focal stenosis was also treated retrograde with a 5 mm angioplasty balloon with excellent results and no residual stenosis. She had a brisk right DP signal at completion.   Procedure:  The patient was identified in the holding area and taken to room 8.  The patient was then placed supine on the table and prepped and draped in the usual sterile fashion.  A time out was called.  Ultrasound was used to evaluate the left common femoral artery.  It was patent .  A digital ultrasound image was acquired.  A micropuncture needle was used to access the left common femoral artery under ultrasound guidance.  An 018 wire was advanced without resistance and a micropuncture sheath was placed.  The 018 wire was removed and a benson wire was placed.  The micropuncture sheath was exchanged for a  5 french sheath.  An omniflush catheter was advanced over the wire to the level of L-1.  An abdominal angiogram was obtained.  Next the catheter was pulled down and bilateral lower extremity runoff was obtained.  Pertinent findings are noted above.  After evaluating the images the only option was a retrograde anterior tibial approach given the only identifiable tibial  vessel was an anterior tibial that reconstituted just above the ankle as noted above.  The right foot was prepped and draped.  I then evaluated the right anterior tibial at the ankle with ultrasound where it was patent and image was saved.  It was accessed retrograde with a micro access needle and a pedal wire and pedal sheath was placed.  I then gave her 100 units/kg IV heparin.  I used a CXI catheter with a V 18 and I was able to cross the anterior tibial occlusion retrograde to get back into the true lumen in the native below-knee popliteal artery where we confirmed on hand-injection that we were in the true lumen.  I placed a 4/5 slender sheath retrograde in the right anterior tibial artery.  The entire AT was angioplastied with a 3 mm x 220 mm Sterling throughout its length.  Retrograde injection showed again a focal dissection in the mid AT but this did not appear flow-limiting and since it was retrograde I did not feel it would limit flow.  That point time I then went ahead and got my wire up across the SFA retrograde from the pedal sheath and the SFA lesion was identified via antegrade injection in the Omni catheter that had been placed over the aortic bifurcation.  This was treated with a 5 mm x 20 mm Sterling to nominal pressure for 2 minutes.  Good results and ultimately wires and catheters were removed.  The pedal sheath was removed and manual pressure was held.  A mynx closure was placed in the left common femoral artery after wires and catheters were removed..  Plan: Dual antiplatelet therapy.  She has follow-up with her podiatrist.  Will arrange follow-up in our office in one month with non-invasive imaging.  Cephus Shelling, MD [facility] Office: [phone]

## 2021-01-12 NOTE — Discharge Instructions (Signed)
Femoral Site Care  This sheet gives you information about how to care for yourself after your procedure. Your health care provider may also give you more specific instructions. If you have problems or questions, contact your health care provider. What can I expect after the procedure? After the procedure, it is common to have:  Bruising that usually fades within 1-2 weeks.  Tenderness at the site. Follow these instructions at home: Wound care  Follow instructions from your health care provider about how to take care of your insertion site. Make sure you: ? Wash your hands with soap and water before you change your bandage (dressing). If soap and water are not available, use hand sanitizer. ? Change your dressing as told by your health care provider. ? Leave stitches (sutures), skin glue, or adhesive strips in place. These skin closures may need to stay in place for 2 weeks or longer. If adhesive strip edges start to loosen and curl up, you may trim the loose edges. Do not remove adhesive strips completely unless your health care provider tells you to do that.  Do not take baths, swim, or use a hot tub until your health care provider approves.  You may shower 24-48 hours after the procedure or as told by your health care provider. ? Gently wash the site with plain soap and water. ? Pat the area dry with a clean towel. ? Do not rub the site. This may cause bleeding.  Do not apply powder or lotion to the site. Keep the site clean and dry.  Check your femoral site every day for signs of infection. Check for: ? Redness, swelling, or pain. ? Fluid or blood. ? Warmth. ? Pus or a bad smell. Activity  For the first 2-3 days after your procedure, or as long as directed: ? Avoid climbing stairs as much as possible. ? Do not squat.  Do not lift anything that is heavier than 10 lb (4.5 kg), or the limit that you are told, until your health care provider says that it is safe.  Rest as  directed. ? Avoid sitting for a long time without moving. Get up to take short walks every 1-2 hours.  Do not drive for 24 hours if you were given a medicine to help you relax (sedative). General instructions  Take over-the-counter and prescription medicines only as told by your health care provider.  Keep all follow-up visits as told by your health care provider. This is important. Contact a health care provider if you have:  A fever or chills.  You have redness, swelling, or pain around your insertion site. Get help right away if:  The catheter insertion area swells very fast.  You pass out.  You suddenly start to sweat or your skin gets clammy.  The catheter insertion area is bleeding, and the bleeding does not stop when you hold steady pressure on the area.  The area near or just beyond the catheter insertion site becomes pale, cool, tingly, or numb. These symptoms may represent a serious problem that is an emergency. Do not wait to see if the symptoms will go away. Get medical help right away. Call your local emergency services (911 in the [place] Do not drive yourself to the hospital. Summary  After the procedure, it is common to have bruising that usually fades within 1-2 weeks.  Check your femoral site every day for signs of infection.  Do not lift anything that is heavier than 10 lb (4.5 kg), or   the limit that you are told, until your health care provider says that it is safe. This information is not intended to replace advice given to you by your health care provider. Make sure you discuss any questions you have with your health care provider. Document Revised: 05/06/2020 Document Reviewed: 05/06/2020 Elsevier Patient Education  2021 [employer]  

## 2021-01-13 ENCOUNTER — Encounter (HOSPITAL_COMMUNITY): Payer: Self-pay | Admitting: Vascular Surgery

## 2021-01-14 ENCOUNTER — Encounter (HOSPITAL_COMMUNITY): Payer: Self-pay | Admitting: Emergency Medicine

## 2021-01-14 ENCOUNTER — Emergency Department (HOSPITAL_COMMUNITY)
Admission: EM | Admit: 2021-01-14 | Discharge: 2021-01-14 | Disposition: A | Discharge status: Home / Self Care | Payer: Medicare Other | Attending: Emergency Medicine | Admitting: Emergency Medicine

## 2021-01-14 ENCOUNTER — Other Ambulatory Visit: Payer: Self-pay

## 2021-01-14 DIAGNOSIS — J449 Chronic obstructive pulmonary disease, unspecified: Secondary | ICD-10-CM | POA: Diagnosis not present

## 2021-01-14 DIAGNOSIS — I10 Essential (primary) hypertension: Secondary | ICD-10-CM | POA: Insufficient documentation

## 2021-01-14 DIAGNOSIS — Z79899 Other long term (current) drug therapy: Secondary | ICD-10-CM | POA: Insufficient documentation

## 2021-01-14 DIAGNOSIS — Z87891 Personal history of nicotine dependence: Secondary | ICD-10-CM | POA: Insufficient documentation

## 2021-01-14 DIAGNOSIS — Z7902 Long term (current) use of antithrombotics/antiplatelets: Secondary | ICD-10-CM | POA: Insufficient documentation

## 2021-01-14 DIAGNOSIS — Z7982 Long term (current) use of aspirin: Secondary | ICD-10-CM | POA: Diagnosis not present

## 2021-01-14 DIAGNOSIS — L03031 Cellulitis of right toe: Secondary | ICD-10-CM | POA: Diagnosis not present

## 2021-01-14 DIAGNOSIS — Z96643 Presence of artificial hip joint, bilateral: Secondary | ICD-10-CM | POA: Diagnosis not present

## 2021-01-14 DIAGNOSIS — M79674 Pain in right toe(s): Secondary | ICD-10-CM | POA: Diagnosis present

## 2021-01-14 DIAGNOSIS — Z8616 Personal history of COVID-19: Secondary | ICD-10-CM | POA: Diagnosis not present

## 2021-01-14 MED ORDER — DOXYCYCLINE HYCLATE 100 MG PO CAPS: 100.0000 mg | ORAL_CAPSULE | Freq: Two times a day (BID) | ORAL | 0 refills | Status: AC

## 2021-01-14 MED ORDER — DOXYCYCLINE HYCLATE 100 MG PO TABS
100.0000 mg | ORAL_TABLET | Freq: Once | ORAL | Status: AC
  Administered 2021-01-14: 100 mg via ORAL
  Filled 2021-01-14: qty 1

## 2021-01-14 NOTE — Discharge Instructions (Addendum)
Please follow-up with your podiatrist or vascular surgeon as scheduled.

## 2021-01-14 NOTE — ED Triage Notes (Signed)
C/o R great toe bleeding and pain that started today.  Nail fell off 3 weeks ago.  States she saw a vascular surgeon on 4/28 that recommended an aortogram/arteriogram with possible intervention.

## 2021-01-14 NOTE — ED Triage Notes (Signed)
Emergency Medicine Provider Triage Evaluation Note  Kiara Little , a 85 y.o. female  was evaluated in triage.  Pt complains of noting blood around big toe, unsure if pus as well. States had vascular procedure recently to improve blood flow.   Review of Systems  Positive: Bleeding at toe Negative: fever  Physical Exam  BP 140/84 (BP Location: Right Arm)   Pulse 63   Temp 98.6 F (37 C) (Oral)   Resp 18   SpO2 98%  Gen:   Awake, no distress HEENT:  Atraumatic Resp:  Normal effort  Cardiac:  Normal rate  MSK:   Moves extremities without difficulty  - need to remove stocking/dressing from RLE once in room   Medical Decision Making  Medically screening exam initiated at 4:28 PM.  Appropriate orders placed.  Adhya P Percifield was informed that the remainder of the evaluation will be completed by another provider, this initial triage assessment does not replace that evaluation, and the importance of remaining in the ED until their evaluation is complete.  Clinical Impression  ?wound vs infection - will move to treatment room for evaluation.    Cathren Laine, MD 01/14/21 1630

## 2021-01-14 NOTE — ED Notes (Signed)
Patient verbalizes understanding of discharge instructions. Prescriptions and follow-up care reviewed. Opportunity for questioning and answers were provided. Armband removed by staff, pt discharged from ED via wheelchair with family member.Kiara Little

## 2021-01-14 NOTE — ED Provider Notes (Signed)
[facility] EMERGENCY DEPARTMENT Provider Note   CSN: [id number] Arrival date & time: 01/14/21  1607     History Chief Complaint  Patient presents with  . Toe Pain    Kiara Little is a 85 y.o. female with a history of peripheral vascular disease, on aspirin and Plavix, presenting to the ED with pain and drainage around her right big toe for the past 24 hours.  Patient underwent vascular surgery with Dr. Sherald Hess on 01/12/21 for critical limb ischemia of right lower extremity:  "Procedure Performed: 1.  Ultrasound-guided access of left common femoral artery 2.  Aortogram including catheter selection of aorta 3.  Ultrasound-guided access of right anterior tibial artery retrograde at the ankle 4.  Right anterior tibial artery angioplasty via retrograde right anterior tibial approach at the ankle(3 mm x 220 mm Sterling) 5.  Right SFA angioplasty via retrograde right anterior tibial approach at the ankle (5 mm x 20 mm Sterling) 6.  Mynx closure of the left common femoral artery"  She presents today in the company of her daughter with concerns for drainage around her right toe.  She reports that there was some purulent drainage from her right toenail with a small amount of blood for the past 24 hours.  They were concerned about infection.  Her pain has been unchanged with the operation.  She is on pain medicine at home.  She has been compliant with her dual antiplatelet therapy.  She denies new numbness, weakness, coldness in her right foot.  She is not on antibiotics.  HPI     Past Medical History:  Diagnosis Date  . Chronic back pain   . COPD (chronic obstructive pulmonary disease) (HCC)   . COVID-06 October 2019  . Hyperlipidemia   . Hypertension   . Muscle spasm   . Noncompliance   . Osteoarthritis    a. s/p R THA  . PAD (peripheral artery disease) [facility])     Patient Active Problem List   Diagnosis Date Noted  . Dry eyes 03/25/2019  .  Atherosclerosis of native arteries of extremity with intermittent claudication (HCC) 11/13/2017  . PAD (peripheral artery disease) (HCC) 07/26/2015  . Pain in joint, lower leg 08/02/2014  . Stiffness of joint, not elsewhere classified, pelvic region and thigh 04/30/2014  . Muscle weakness (generalized) 04/30/2014  . Degenerative arthritis of left hip 07/17/2013  . HTN (hypertension) 02/05/2013  . Chest pain, atypical 02/05/2013  . Anxiety 02/05/2013  . Nicotine abuse 02/05/2013    Past Surgical History:  Procedure Laterality Date  . ABDOMINAL AORTOGRAM W/LOWER EXTREMITY N/A 01/12/2021   Procedure: ABDOMINAL AORTOGRAM W/LOWER EXTREMITY;  Surgeon: Cephus Shelling, MD;  Location: [facility] INVASIVE CV LAB;  Service: Cardiovascular;  Laterality: N/A;  . BACK SURGERY    . Cataract surgeries Right 07/2019  . JOINT REPLACEMENT  2000   RIGHT TOTAL HIP ARTHROPLASTY  . PERIPHERAL VASCULAR BALLOON ANGIOPLASTY Right 01/12/2021   Procedure: PERIPHERAL VASCULAR BALLOON ANGIOPLASTY;  Surgeon: Cephus Shelling, MD;  Location: [facility] INVASIVE CV LAB;  Service: Cardiovascular;  Laterality: Right;  AT and SFA  . SURGERY FOR TUBAL PREGNANCY    . TOTAL HIP ARTHROPLASTY Left 07/24/2013   Procedure: LEFT TOTAL HIP ARTHROPLASTY ANTERIOR APPROACH;  Surgeon: Kathryne Hitch, MD;  Location: [facility] ORS;  Service: Orthopedics;  Laterality: Left;     OB History    Gravida  9   Para      Term  Preterm      AB      Living  6     SAB      IAB      Ectopic      Multiple      Live Births              Family History  Problem Relation Age of Onset  . Heart attack Mother   . Diabetes Father   . Cancer Brother     Social History   Tobacco Use  . Smoking status: Former Smoker    Packs/day: 0.50    Years: 40.00    Pack years: 20.00    Types: Cigarettes    Quit date: 07/24/2013    Years since quitting: 7.4  . Smokeless tobacco: Never Used  Vaping Use  . Vaping Use: Never used   Substance Use Topics  . Alcohol use: No  . Drug use: No    Home Medications Prior to Admission medications   Medication Sig Start Date End Date Taking? Authorizing Provider  doxycycline (VIBRAMYCIN) 100 MG capsule Take 1 capsule (100 mg total) by mouth 2 (two) times daily for 10 days. 12/16/21 12/26/21 Yes Nury Nebergall, Kermit Balo, MD  amLODipine (NORVASC) 10 MG tablet Take 10 mg by mouth every morning.    [provider]  aspirin EC 81 MG tablet Take 1 tablet (81 mg total) by mouth daily. Swallow whole. 01/12/21 01/12/22  Cephus Shelling, MD  atorvastatin (LIPITOR) 10 MG tablet TAKE 1 TABLET BY MOUTH EVERY DAY Patient taking differently: Take 10 mg by mouth daily. 12/02/20   Maeola Harman, MD  azelastine (ASTELIN) 0.1 % nasal spray Place 1 spray into both nostrils 2 (two) times daily.    [provider]  clopidogrel (PLAVIX) 75 MG tablet Take 1 tablet (75 mg total) by mouth daily. 01/12/21 01/12/22  Cephus Shelling, MD  D3-50 1.25 MG (50000 UT) capsule Take 50,000 Units by mouth daily. 02/04/19   [provider]  HYDROcodone-acetaminophen (NORCO) 7.5-325 MG tablet Take 1 tablet by mouth every 8 (eight) hours as needed. 01/04/21   [provider]    Allergies    Amphetamine and Ibuprofen  Review of Systems   Review of Systems  Constitutional: Negative for chills and fever.  Respiratory: Negative for cough and shortness of breath.   Cardiovascular: Negative for chest pain and palpitations.  Gastrointestinal: Negative for abdominal pain and vomiting.  Musculoskeletal: Positive for arthralgias and myalgias.  Skin: Negative for color change and rash.  Neurological: Negative for syncope and light-headedness.  All other systems reviewed and are negative.   Physical Exam Updated Vital Signs BP 128/79 (BP Location: Right Arm)   Pulse 60   Temp 98.4 F (36.9 C) (Oral)   Resp 16   SpO2 99%   Physical Exam Constitutional:      General: She  is not in acute distress. HENT:     Head: Normocephalic and atraumatic.  Eyes:     Conjunctiva/sclera: Conjunctivae normal.     Pupils: Pupils are equal, round, and reactive to light.  Cardiovascular:     Rate and Rhythm: Normal rate and regular rhythm.     Comments: Pedal pulse present right foot Pulmonary:     Effort: Pulmonary effort is normal. No respiratory distress.  Abdominal:     General: There is no distension.     Tenderness: There is no abdominal tenderness.  Musculoskeletal:     Comments: Right foot warm,  no discoloration, mild edema Right toe missing toenail, bogginess of proximal right toenail suggestive of drained paronychia  Skin:    General: Skin is warm and dry.  Neurological:     General: No focal deficit present.     Mental Status: She is alert. Mental status is at baseline.  Psychiatric:        Mood and Affect: Mood normal.        Behavior: Behavior normal.     ED Results / Procedures / Treatments   Labs (all labs ordered are listed, but only abnormal results are displayed) Labs Reviewed - No data to display  EKG None  Radiology No results found.  Procedures Procedures   Medications Ordered in ED Medications  doxycycline (VIBRA-TABS) tablet 100 mg (100 mg Oral Given 01/14/21 2047)    ED Course  I have reviewed the triage vital signs and the nursing notes.  Pertinent labs & imaging results that were available during my care of the patient were reviewed by me and considered in my medical decision making (see chart for details).  85 yo female presented to ED with concern for right toenail drainage small amount of bleeding.  Clinically this appears consistent with paronychia, which likely spontaneously drained.  Does not feel an active collection there.  There is no ulceration or open wound of the foot.  The toes and foot appear well perfused, not discolored.  There is some mild edema of the foot, which may be consistent with the operation she had 3  days ago, but no erythema or tenderness to suggest cellulitis or deep space infection.  Her pulse is present.  We will start her on doxycycline today to cover for paronychia.  She has a podiatry appointment coming up next month.  Prior medical records reviewed including operative report from April 2022 Dr Chestine Spore.    Final Clinical Impression(s) / ED Diagnoses Final diagnoses:  Paronychia, toe, right    Rx / DC Orders ED Discharge Orders         Ordered    doxycycline (VIBRAMYCIN) 100 MG capsule  2 times daily        01/14/21 2004           Terald Sleeper, MD 01/15/21 1116

## 2021-01-20 ENCOUNTER — Other Ambulatory Visit: Payer: Self-pay

## 2021-01-20 DIAGNOSIS — I70213 Atherosclerosis of native arteries of extremities with intermittent claudication, bilateral legs: Secondary | ICD-10-CM

## 2021-01-26 ENCOUNTER — Ambulatory Visit
Admission: RE | Admit: 2021-01-26 | Discharge: 2021-01-26 | Disposition: A | Discharge status: Home / Self Care | Payer: Medicare Other | Source: Ambulatory Visit | Attending: Family | Admitting: Family

## 2021-01-26 DIAGNOSIS — R748 Abnormal levels of other serum enzymes: Secondary | ICD-10-CM

## 2021-01-30 ENCOUNTER — Other Ambulatory Visit: Payer: Medicaid Other

## 2021-01-31 ENCOUNTER — Other Ambulatory Visit: Payer: Self-pay | Admitting: Family

## 2021-01-31 DIAGNOSIS — R935 Abnormal findings on diagnostic imaging of other abdominal regions, including retroperitoneum: Secondary | ICD-10-CM

## 2021-02-14 ENCOUNTER — Encounter: Payer: Self-pay | Admitting: Physician Assistant

## 2021-02-14 ENCOUNTER — Other Ambulatory Visit: Payer: Self-pay

## 2021-02-14 ENCOUNTER — Ambulatory Visit (INDEPENDENT_AMBULATORY_CARE_PROVIDER_SITE_OTHER): Payer: Medicare Other | Admitting: Physician Assistant

## 2021-02-14 ENCOUNTER — Ambulatory Visit (HOSPITAL_COMMUNITY)
Admission: RE | Admit: 2021-02-14 | Discharge: 2021-02-14 | Disposition: A | Discharge status: Home / Self Care | Payer: Medicare Other | Source: Ambulatory Visit | Attending: Vascular Surgery | Admitting: Vascular Surgery

## 2021-02-14 VITALS — BP 156/87 | HR 62 | Temp 98.1°F | Resp 20 | Ht 69.0 in | Wt 212.1 lb

## 2021-02-14 DIAGNOSIS — I739 Peripheral vascular disease, unspecified: Secondary | ICD-10-CM | POA: Diagnosis not present

## 2021-02-14 DIAGNOSIS — I70213 Atherosclerosis of native arteries of extremities with intermittent claudication, bilateral legs: Secondary | ICD-10-CM | POA: Insufficient documentation

## 2021-02-14 NOTE — Progress Notes (Signed)
Office Note     CC:  follow up Requesting Provider:  Raymon Mutton., FNP  HPI: Kiara Little is a 85 y.o. ([birth year]) female who presents for follow up of peripheral artery disease. She just underwent Aortogram, right anterior tibial angioplasty via retrograde right AT approach at the ankle and right SFA angioplasty via retrograde right AT approach at ankle by Dr. Chestine Spore on 01/12/21. This was performed due to  CLI with non healing right great toe wound.  Findings from arteriogram were as follows: Aortogram showed patent renal arteries bilaterally with an ectatic infrarenal aorta but no flow-limiting stenosis in the aortoiliac segment. She does have high femoral bifurcation both groins.  Right lower extremity which is the side of interest showed apatent common femoral, profunda and proximal SFA. There is afocal high-grade stenosis in the distal SFA that was greater than 75%. Ultimately her above and below-knee popliteal artery was patent. She had severe tibial disease and the tibial trifurcation isoccluded. She had only collaterals in the calf and on lateral foot shot we only identified an anterior tibial that reconstituted just above the ankle.  Left lower extremity also showed a patent but diseased SFA with a patent popliteal artery above and below the knee and occluded tibial trifurcation with no runoff identified in the left lower extremityexcept collaterals.  Ultimately the only option was retrograde tibial access given she had no distal bypass target and only reconstituted anterior tibialjust above the ankle. This was evaluated ultrasound and with retrograde access I placed apedal sheath in the right anterior tibial just above the ankle. I was able to use a V 18 with a CXI catheter tocross the anterior tibial get back into the below-knee popliteal artery in the true lumen. The entire anterior tibialwas angioplastied with 3 mm balloon to nominal pressure. There was a evidence  of a dissection in the mid anterior tibial but this was a retrograde dissection and it did not appear flow-limiting. The SFA focal stenosis was also treated retrograde with a 5 mm angioplasty balloon with excellent results and no residual stenosis. She had a brisk right DP signal at completion.  Today she says she is doing great. She saw her Podiatrist last week and was cleared by them. The wound is doing well. She says she still is having a little drainage from toe nail bed but otherwise the toe has healed. She is not having any pain. She still reports some swelling in the leg and foot. She is elevating daily with some improvement. She denies any rest pain, claudication, or new wounds  The pt is on a statin for cholesterol management.  The pt is on a daily aspirin.   Other AC:  Plavix The pt is on CCB for hypertension.   The pt is notdiabetic.   Tobacco hx: Former, quit 2014  Past Medical History:  Diagnosis Date  . Chronic back pain   . COPD (chronic obstructive pulmonary disease) (HCC)   . COVID-06 October 2019  . Hyperlipidemia   . Hypertension   . Muscle spasm   . Noncompliance   . Osteoarthritis    a. s/p R THA  . PAD (peripheral artery disease) (HCC)     Past Surgical History:  Procedure Laterality Date  . ABDOMINAL AORTOGRAM W/LOWER EXTREMITY N/A 01/12/2021   Procedure: ABDOMINAL AORTOGRAM W/LOWER EXTREMITY;  Surgeon: Cephus Shelling, MD;  Location: [facility] INVASIVE CV LAB;  Service: Cardiovascular;  Laterality: N/A;  . BACK SURGERY    .  Cataract surgeries Right 07/2019  . JOINT REPLACEMENT  2000   RIGHT TOTAL HIP ARTHROPLASTY  . PERIPHERAL VASCULAR BALLOON ANGIOPLASTY Right 01/12/2021   Procedure: PERIPHERAL VASCULAR BALLOON ANGIOPLASTY;  Surgeon: Cephus Shelling, MD;  Location: [facility] INVASIVE CV LAB;  Service: Cardiovascular;  Laterality: Right;  AT and SFA  . SURGERY FOR TUBAL PREGNANCY    . TOTAL HIP ARTHROPLASTY Left 07/24/2013   Procedure: LEFT TOTAL HIP  ARTHROPLASTY ANTERIOR APPROACH;  Surgeon: Kathryne Hitch, MD;  Location: [facility];  Service: Orthopedics;  Laterality: Left;    Social History   Socioeconomic History  . Marital status: Legally Separated    Spouse name: Not on file  . Number of children: Not on file  . Years of education: Not on file  . Highest education level: Not on file  Occupational History  . Not on file  Tobacco Use  . Smoking status: Former Smoker    Packs/day: 0.50    Years: 40.00    Pack years: 20.00    Types: Cigarettes    Quit date: 07/24/2013    Years since quitting: 7.5  . Smokeless tobacco: Never Used  Vaping Use  . Vaping Use: Never used  Substance and Sexual Activity  . Alcohol use: No  . Drug use: No  . Sexual activity: Not on file  Other Topics Concern  . Not on file  Social History Narrative   Pt lives in [place] with her son.  She is retired.  She does not exercise.   Social Determinants of Health   Financial Resource Strain: Not on file  Food Insecurity: Not on file  Transportation Needs: Not on file  Physical Activity: Not on file  Stress: Not on file  Social Connections: Not on file  Intimate Partner Violence: Not on file    Family History  Problem Relation Age of Onset  . Heart attack Mother   . Diabetes Father   . Cancer Brother     Current Outpatient Medications  Medication Sig Dispense Refill  . amLODipine (NORVASC) 10 MG tablet Take 10 mg by mouth every morning.    Marland Kitchen aspirin EC 81 MG tablet Take 1 tablet (81 mg total) by mouth daily. Swallow whole. 150 tablet 2  . atorvastatin (LIPITOR) 10 MG tablet TAKE 1 TABLET BY MOUTH EVERY DAY (Patient taking differently: Take 10 mg by mouth daily.) 90 tablet 3  . azelastine (ASTELIN) 0.1 % nasal spray Place 1 spray into both nostrils 2 (two) times daily.    . clopidogrel (PLAVIX) 75 MG tablet Take 1 tablet (75 mg total) by mouth daily. 30 tablet 11  . D3-50 1.25 MG (50000 UT) capsule Take 50,000 Units by mouth daily.     Melene Muller ON 12/16/2021] doxycycline (VIBRAMYCIN) 100 MG capsule Take 1 capsule (100 mg total) by mouth 2 (two) times daily for 10 days. 20 capsule 0  . HYDROcodone-acetaminophen (NORCO) 7.5-325 MG tablet Take 1 tablet by mouth every 8 (eight) hours as needed.     No current facility-administered medications for this visit.    Allergies  Allergen Reactions  . Amphetamine Itching  . Ibuprofen Hives and Rash    Pt states "It makes me break out on my backside"     REVIEW OF SYSTEMS:  [X]  denotes positive finding, [ ]  denotes negative finding Cardiac  Comments:  Chest pain or chest pressure:    Shortness of breath upon exertion:    Short of breath when lying flat:  Irregular heart rhythm:        Vascular    Pain in calf, thigh, or hip brought on by ambulation:    Pain in feet at night that wakes you up from your sleep:     Blood clot in your veins:    Leg swelling:  X       Pulmonary    Oxygen at home:    Productive cough:     Wheezing:         Neurologic    Sudden weakness in arms or legs:     Sudden numbness in arms or legs:     Sudden onset of difficulty speaking or slurred speech:    Temporary loss of vision in one eye:     Problems with dizziness:         Gastrointestinal    Blood in stool:     Vomited blood:         Genitourinary    Burning when urinating:     Blood in urine:        Psychiatric    Major depression:         Hematologic    Bleeding problems:    Problems with blood clotting too easily:        Skin    Rashes or ulcers:        Constitutional    Fever or chills:      PHYSICAL EXAMINATION:  There were no vitals filed for this visit.  General:  WDWN in NAD; vital signs documented above Gait: Normal HENT: WNL, normocephalic Pulmonary: normal non-labored breathing , without wheezing Cardiac: regular HR, without  Murmurs without carotid bruit Abdomen: Obese,non distended Vascular Exam/Pulses: 2+ femoral pulses, 2+ right DP. No palpable  distal pulses in left foot. Feett warm and well perfused Extremities: without ischemic changes, without Gangrene , without cellulitis; without open wounds; small macerated area along nailbed of right great toe   Musculoskeletal: no muscle wasting or atrophy  Neurologic: A&O X 3;  No focal weakness or paresthesias are detected Psychiatric:  The pt has Normal affect.   Non-Invasive Vascular Imaging:   +-------+-----------+-----------+------------+------------+  ABI/TBIToday's ABIToday's TBIPrevious ABIPrevious TBI  +-------+-----------+-----------+------------+------------+  Right 0.94    0.50    0.57    not obtained  +-------+-----------+-----------+------------+------------+  Left  0.61    0.35    0.75    0.29      +-------+-----------+-----------+------------+------------+    ASSESSMENT/PLAN:: 85 y.o. female here for follow up for follow up after Angiogram with retrograde access and angioplasty of the right anterior tibial and SFA arteries by Dr. Chestine Spore on 01/12/21. Right toe nail bed healing well and right toe wound healed. Right leg is well perfused with palpable DP pulse. ABIs with significant improvement in RLE. LLE with known tibial occlusive disease. She is without claudication, rest pain or new tissue loss.  - Continue Aspirin, statin, and Plavix - Elevation PRN, wound caution any compression at this time with her significant tibial disease - discussed importance of keeping feet protected - 6 months non invasive studies right lower extremity arterial duplex and ABIs   Graceann Congress, PA-C [facility] [phone]  Clinic MD: Steve Rattler

## 2021-02-17 ENCOUNTER — Other Ambulatory Visit: Payer: Self-pay

## 2021-02-17 DIAGNOSIS — I739 Peripheral vascular disease, unspecified: Secondary | ICD-10-CM

## 2021-02-18 ENCOUNTER — Ambulatory Visit
Admission: RE | Admit: 2021-02-18 | Discharge: 2021-02-18 | Disposition: A | Discharge status: Home / Self Care | Payer: Medicare Other | Source: Ambulatory Visit | Attending: Family | Admitting: Family

## 2021-02-18 ENCOUNTER — Other Ambulatory Visit: Payer: Self-pay

## 2021-02-18 DIAGNOSIS — R935 Abnormal findings on diagnostic imaging of other abdominal regions, including retroperitoneum: Secondary | ICD-10-CM

## 2021-02-18 MED ORDER — GADOBENATE DIMEGLUMINE 529 MG/ML IV SOLN
20.0000 mL | Freq: Once | INTRAVENOUS | Status: AC | PRN
  Administered 2021-02-18: 20 mL via INTRAVENOUS

## 2021-03-02 ENCOUNTER — Other Ambulatory Visit: Payer: Self-pay | Admitting: Family

## 2021-03-02 DIAGNOSIS — R2241 Localized swelling, mass and lump, right lower limb: Secondary | ICD-10-CM

## 2021-03-02 DIAGNOSIS — Z1231 Encounter for screening mammogram for malignant neoplasm of breast: Secondary | ICD-10-CM

## 2021-03-03 ENCOUNTER — Ambulatory Visit
Admission: RE | Admit: 2021-03-03 | Discharge: 2021-03-03 | Disposition: A | Discharge status: Home / Self Care | Payer: Medicare Other | Source: Ambulatory Visit | Attending: Family | Admitting: Family

## 2021-03-03 DIAGNOSIS — R2241 Localized swelling, mass and lump, right lower limb: Secondary | ICD-10-CM

## 2021-04-27 ENCOUNTER — Ambulatory Visit: Payer: Medicare Other

## 2021-05-09 NOTE — Progress Notes (Signed)
Patient presents as a walk in today with complaint of pain in her left groin area. She is s/p aortogram in April. Assessed groin site - previous healed scar from distant hip replacement - no wound or hematoma. Patient also complained of swelling in her right foot. Removed patient shoe and stocking- foot was slightly swollen, no ulcers noted. Dopplerable PT and DP pulses. Discussed with Dr. Chestine Spore and advised her that she was getting blood flow to her foot and she should follow up with PCP. Patient asked if she could have a beer. Advised we didn't recommend drinking - but an occasional beer would probably be okay.

## 2021-05-10 ENCOUNTER — Telehealth: Payer: Self-pay

## 2021-05-10 NOTE — Telephone Encounter (Signed)
Patient's daughter calls today to ask for refill on robaxin. Advised that we could not refill it and to follow up with PCP. She verbalized understanding.

## 2021-05-19 ENCOUNTER — Other Ambulatory Visit: Payer: Self-pay | Admitting: Family Medicine

## 2021-05-19 ENCOUNTER — Ambulatory Visit
Admission: RE | Admit: 2021-05-19 | Discharge: 2021-05-19 | Disposition: A | Discharge status: Home / Self Care | Payer: Medicare Other | Source: Ambulatory Visit | Attending: Family Medicine | Admitting: Family Medicine

## 2021-05-19 DIAGNOSIS — M5440 Lumbago with sciatica, unspecified side: Secondary | ICD-10-CM

## 2021-05-19 DIAGNOSIS — M25552 Pain in left hip: Secondary | ICD-10-CM

## 2021-06-14 ENCOUNTER — Encounter: Payer: Self-pay | Admitting: Orthopaedic Surgery

## 2021-06-14 ENCOUNTER — Ambulatory Visit (INDEPENDENT_AMBULATORY_CARE_PROVIDER_SITE_OTHER): Payer: Medicare Other | Admitting: Orthopaedic Surgery

## 2021-06-14 DIAGNOSIS — M5442 Lumbago with sciatica, left side: Secondary | ICD-10-CM

## 2021-06-14 DIAGNOSIS — M7062 Trochanteric bursitis, left hip: Secondary | ICD-10-CM | POA: Diagnosis not present

## 2021-06-14 MED ORDER — PREDNISONE 50 MG PO TABS: ORAL_TABLET | ORAL | 0 refills | Status: DC

## 2021-06-14 NOTE — Progress Notes (Signed)
Office Visit Note   Patient: Kiara Little           Date of Birth: 09/29/1935           MRN: [id number] Visit Date: 06/14/2021              Requested by: Raymon Mutton., FNP [address] [place],  [place] [address] PCP: Raymon Mutton., FNP   Assessment & Plan: Visit Diagnoses:  1. Acute left-sided low back pain with left-sided sciatica   2. Trochanteric bursitis, left hip     Plan:   I did place a steroid injection over the left hip trochanteric area which she tolerated well.  I would like to send her to Dr. Alvester Morin for a left-sided L4-L5 epidural steroid injection.  She had an MRI of her lumbar spine in December of last year.  She has had previous spine surgery but no instrumentation.  She is to get injections over at Dr. Earl Gala office which she would like to see if Dr. Alvester Morin would least try a one-time injection since she is having this acute flareup of pain.  I will send in some prednisone as well since she is not a diabetic.  She will continue using her rolling walker and activity modification.  She cannot take anti-inflammatories since she is on Plavix.  She already has pain medicine and muscle relaxants at home.  I will see her back myself in about 4 weeks.  By then hopefully she will be feeling better and will have had an intervention by Dr. Alvester Morin.  Follow-Up Instructions: No follow-ups on file.   Orders:  No orders of the defined types were placed in this encounter.  Meds ordered this encounter  Medications   predniSONE (DELTASONE) 50 MG tablet    Sig: Take one tablet daily for 5 days.    Dispense:  5 tablet    Refill:  0      Procedures: No procedures performed   Clinical Data: No additional findings.   Subjective: Chief Complaint  Patient presents with   Left Hip - Pain  The patient comes in today for evaluation treatment of left hip pain.  Actually replaced her left hip in 2014.  She has a remote history of a right hip replacement.  She is on  Plavix so she cannot take anti-inflammatories.  She does have chronic hydrocodone and muscle relaxant.  She has had pain for about a month now that is worsening in both her sciatic area and the left hip and thigh.  It does radiate down to the knee but not to the foot.  She denies any groin pain.  HPI  Review of Systems She currently denies any headache, chest pain, shortness of breath, fever, chills, nausea, vomiting  Objective: Vital Signs: There were no vitals taken for this visit.  Physical Exam She is alert and orient x3 and in no acute distress Ortho Exam Examination of her left hip shows it moves smoothly and fluidly.  Most of her pain is over the trochanteric area but a significant amount of this is the left side of her low back and into the sciatic region. Specialty Comments:  No specialty comments available.  Imaging: No results found. X-rays of the left hip earlier this month show normal-appearing hip replacement with no complicating features.  X-rays of the lumbar spine show an anterolisthesis at L4 and L5 with some degenerative changes in the lumbar spine.  PMFS History: Patient Active Problem  List   Diagnosis Date Noted   Dystrophia unguium 12/27/2020   Pain in right foot 12/27/2020   Lumbar spondylosis 10/26/2020   Body mass index (BMI) 31.0-31.9, adult 08/08/2020   Chronic low back pain 08/08/2020   Other chronic pain 08/08/2020   Cock-up deformity of great toe 03/11/2020   Dry eyes 03/25/2019   Atherosclerosis of native arteries of extremity with intermittent claudication (HCC) 11/13/2017   PAD (peripheral artery disease) (HCC) 07/26/2015   Pain in joint, lower leg 08/02/2014   Stiffness of joint, not elsewhere classified, pelvic region and thigh 04/30/2014   Muscle weakness (generalized) 04/30/2014   Degenerative arthritis of left hip 07/17/2013   HTN (hypertension) 02/05/2013   Chest pain, atypical 02/05/2013   Anxiety 02/05/2013   Nicotine abuse 02/05/2013    Past Medical History:  Diagnosis Date   Chronic back pain    COPD (chronic obstructive pulmonary disease) (HCC)    COVID-06 October 2019   Hyperlipidemia    Hypertension    Muscle spasm    Noncompliance    Osteoarthritis    a. s/p R THA   PAD (peripheral artery disease) (HCC)     Family History  Problem Relation Age of Onset   Heart attack Mother    Diabetes Father    Cancer Brother     Past Surgical History:  Procedure Laterality Date   ABDOMINAL AORTOGRAM W/LOWER EXTREMITY N/A 01/12/2021   Procedure: ABDOMINAL AORTOGRAM W/LOWER EXTREMITY;  Surgeon: Cephus Shelling, MD;  Location: [facility] INVASIVE CV LAB;  Service: Cardiovascular;  Laterality: N/A;   BACK SURGERY     Cataract surgeries Right 07/2019   JOINT REPLACEMENT  2000   RIGHT TOTAL HIP ARTHROPLASTY   PERIPHERAL VASCULAR BALLOON ANGIOPLASTY Right 01/12/2021   Procedure: PERIPHERAL VASCULAR BALLOON ANGIOPLASTY;  Surgeon: Cephus Shelling, MD;  Location: [facility] INVASIVE CV LAB;  Service: Cardiovascular;  Laterality: Right;  AT and SFA   SURGERY FOR TUBAL PREGNANCY     TOTAL HIP ARTHROPLASTY Left 07/24/2013   Procedure: LEFT TOTAL HIP ARTHROPLASTY ANTERIOR APPROACH;  Surgeon: Kathryne Hitch, MD;  Location: [facility];  Service: Orthopedics;  Laterality: Left;   Social History   Occupational History   Not on file  Tobacco Use   Smoking status: Former    Packs/day: 0.50    Years: 40.00    Pack years: 20.00    Types: Cigarettes    Quit date: 07/24/2013    Years since quitting: 7.8   Smokeless tobacco: Never  Vaping Use   Vaping Use: Never used  Substance and Sexual Activity   Alcohol use: No   Drug use: No   Sexual activity: Not on file

## 2021-06-14 NOTE — Addendum Note (Signed)
Addended by: Barbette Or on: 06/14/2021 04:38 PM   Modules accepted: Orders

## 2021-07-03 ENCOUNTER — Ambulatory Visit (INDEPENDENT_AMBULATORY_CARE_PROVIDER_SITE_OTHER): Payer: Medicare Other | Admitting: Physical Medicine and Rehabilitation

## 2021-07-03 ENCOUNTER — Ambulatory Visit: Payer: Self-pay

## 2021-07-03 ENCOUNTER — Encounter: Payer: Self-pay | Admitting: Physical Medicine and Rehabilitation

## 2021-07-03 ENCOUNTER — Other Ambulatory Visit: Payer: Self-pay

## 2021-07-03 VITALS — BP 130/79 | HR 80

## 2021-07-03 DIAGNOSIS — M5416 Radiculopathy, lumbar region: Secondary | ICD-10-CM

## 2021-07-03 MED ORDER — METHYLPREDNISOLONE ACETATE 80 MG/ML IJ SUSP
80.0000 mg | Freq: Once | INTRAMUSCULAR | Status: AC
  Administered 2021-07-03: 80 mg

## 2021-07-03 NOTE — Patient Instructions (Signed)

## 2021-07-03 NOTE — Progress Notes (Signed)
May need PT, lives in [place] but wants it here. Pt state lower back pain that travels to her left buttocks. Pt state walking, standing and laying down makes the pain worse. Pt state she takes pain meds, uses heat/ ice and pain cream to help ease her pain.  Numeric Pain Rating Scale and Functional Assessment Average Pain 10   In the last MONTH (on 0-10 scale) has pain interfered with the following?  1. General activity like being  able to carry out your everyday physical activities such as walking, climbing stairs, carrying groceries, or moving a chair?  Rating(10)   +Driver, +BT Plavix, -Dye Allergies.

## 2021-07-12 ENCOUNTER — Other Ambulatory Visit: Payer: Self-pay

## 2021-07-12 ENCOUNTER — Ambulatory Visit (INDEPENDENT_AMBULATORY_CARE_PROVIDER_SITE_OTHER): Payer: Medicare Other | Admitting: Orthopaedic Surgery

## 2021-07-12 DIAGNOSIS — M545 Low back pain, unspecified: Secondary | ICD-10-CM | POA: Diagnosis not present

## 2021-07-12 NOTE — Progress Notes (Signed)
Office Visit Note   Patient: Kiara Little           Date of Birth: 23-Feb-1936           MRN: [id number] Visit Date: 07/12/2021              Requested by: Raymon Mutton., FNP [address] [place],  [place] [address] PCP: Raymon Mutton., FNP   Assessment & Plan: Visit Diagnoses:  1. Left-sided low back pain without sciatica, unspecified chronicity     Plan: We will send her to formal physical therapy for IT band stretching, back exercises, core strengthening, home exercise program and modalities.  See her back in 6 weeks to see how she is doing overall.  Should follow-up sooner if there is any questions concerns or if her condition worsens.  Questions were encouraged and answered by Dr. Magnus Ivan and myself.   Follow-Up Instructions: Return in about 6 weeks (around 08/23/2021).   Orders:  No orders of the defined types were placed in this encounter.  No orders of the defined types were placed in this encounter.     Procedures: No procedures performed   Clinical Data: No additional findings.   Subjective: Chief Complaint  Patient presents with   Lower Back - Follow-up    HPI Kiara Little returns today due to low back pain.  She is having radicular symptoms down her left leg.  She states that the epidural steroid injection with Dr. Alvester Morin greatly helped.  She is no longer having any radicular symptoms down the left leg.  However she is still having a "twinge of pain" left low back buttocks and hip region.  Review of Systems See HPI otherwise negative  Objective: Vital Signs: There were no vitals taken for this visit.  Physical Exam Constitutional:      Appearance: She is not ill-appearing or diaphoretic.  Pulmonary:     Effort: Pulmonary effort is normal.  Neurological:     Mental Status: She is alert and oriented to person, place, and time.  Psychiatric:        Mood and Affect: Mood normal.    Ortho Exam Lumbar spine she has tenderness over the left  lower lumbar paraspinous region, SI joint and left trochanteric region.   Specialty Comments:  No specialty comments available.  Imaging: No results found.   PMFS History: Patient Active Problem List   Diagnosis Date Noted   Dystrophia unguium 12/27/2020   Pain in right foot 12/27/2020   Lumbar spondylosis 10/26/2020   Body mass index (BMI) 31.0-31.9, adult 08/08/2020   Chronic low back pain 08/08/2020   Other chronic pain 08/08/2020   Cock-up deformity of great toe 03/11/2020   Dry eyes 03/25/2019   Atherosclerosis of native arteries of extremity with intermittent claudication (HCC) 11/13/2017   PAD (peripheral artery disease) (HCC) 07/26/2015   Pain in joint, lower leg 08/02/2014   Stiffness of joint, not elsewhere classified, pelvic region and thigh 04/30/2014   Muscle weakness (generalized) 04/30/2014   Degenerative arthritis of left hip 07/17/2013   HTN (hypertension) 02/05/2013   Chest pain, atypical 02/05/2013   Anxiety 02/05/2013   Nicotine abuse 02/05/2013   Past Medical History:  Diagnosis Date   Chronic back pain    COPD (chronic obstructive pulmonary disease) (HCC)    COVID-06 October 2019   Hyperlipidemia    Hypertension    Muscle spasm    Noncompliance    Osteoarthritis  a. s/p R THA   PAD (peripheral artery disease) (HCC)     Family History  Problem Relation Age of Onset   Heart attack Mother    Diabetes Father    Cancer Brother     Past Surgical History:  Procedure Laterality Date   ABDOMINAL AORTOGRAM W/LOWER EXTREMITY N/A 01/12/2021   Procedure: ABDOMINAL AORTOGRAM W/LOWER EXTREMITY;  Surgeon: Cephus Shelling, MD;  Location: [facility]AS[facility];  Service: Cardiovascular;  Laterality: N/A;   BACK SURGERY     Cataract surgeries Right 07/2019   JOINT REPLACEMENT  2000   RIGHT TOTAL HIP ARTHROPLASTY   PERIPHERAL VASCULAR BALLOON ANGIOPLASTY Right 01/12/2021   Procedure: PERIPHERAL VASCULAR BALLOON ANGIOPLASTY;  Surgeon: Cephus Shelling, MD;  Location: [facility];  Service: Cardiovascular;  Laterality: Right;  AT and SFA   SURGERY FOR TUBAL PREGNANCY     TOTAL HIP ARTHROPLASTY Left 07/24/2013   Procedure: LEFT TOTAL HIP ARTHROPLASTY ANTERIOR APPROACH;  Surgeon: Kathryne Hitch, MD;  Location: [facility];  Service: Orthopedics;  Laterality: Left;   Social History   Occupational History   Not on file  Tobacco Use   Smoking status: Former    Packs/day: 0.50    Years: 40.00    Pack years: 20.00    Types: Cigarettes    Quit date: 07/24/2013    Years since quitting: 7.9   Smokeless tobacco: Never  Vaping Use   Vaping Use: Never used  Substance and Sexual Activity   Alcohol use: No   Drug use: No   Sexual activity: Not on file

## 2021-07-14 ENCOUNTER — Other Ambulatory Visit: Payer: Self-pay

## 2021-07-14 DIAGNOSIS — M545 Low back pain, unspecified: Secondary | ICD-10-CM

## 2021-07-17 NOTE — Progress Notes (Signed)
Kiara Little - 85 y.o. female MRN [id number]  Date of birth: 1936/06/01  Office Visit Note: Visit Date: 07/03/2021 PCP: Raymon Mutton., FNP Referred by: Raymon Mutton., FNP  Subjective: Chief Complaint  Patient presents with   Lower Back - Pain   HPI:  Kiara Little is a 85 y.o. female who comes in today at the request of Dr. Doneen Poisson for planned Left L4-5 Lumbar Transforaminal epidural steroid injection with fluoroscopic guidance.  The patient has failed conservative care including home exercise, medications, time and activity modification.  This injection will be diagnostic and hopefully therapeutic.  Please see requesting physician notes for further details and justification.  She has follow-up scheduled with Dr. Magnus Ivan.  She may need physical therapy and does want to do that in our office if possible.  She does live in [place] but she likes to come here for her medical care.  ROS Otherwise per HPI.  Assessment & Plan: Visit Diagnoses:    ICD-10-CM   1. Lumbar radiculopathy  M54.16 XR C-ARM NO REPORT    Epidural Steroid injection    methylPREDNISolone acetate (DEPO-MEDROL) injection 80 mg      Plan: No additional findings.   Meds & Orders:  Meds ordered this encounter  Medications   methylPREDNISolone acetate (DEPO-MEDROL) injection 80 mg    Orders Placed This Encounter  Procedures   XR C-ARM NO REPORT   Epidural Steroid injection    Follow-up: Return if symptoms worsen or fail to improve.   Procedures: No procedures performed      Clinical History: MRI LUMBAR SPINE WITHOUT CONTRAST   TECHNIQUE: Multiplanar, multisequence MR imaging of the lumbar spine was performed. No intravenous contrast was administered.   COMPARISON:  MRI 06/02/2011   FINDINGS: Segmentation: Same numbering terminology employed. Five lumbar type vertebral bodies.   Alignment: Degenerative anterolisthesis at L4-5 of 7 mm, similar to the study of 2012.    Vertebrae: Discogenic endplate changes at the L2-3 level with some edema. This could be associated with regional back pain.   Conus medullaris and cauda equina: Conus extends to the L2-3 level. Conus and cauda equina appear normal.   Paraspinal and other soft tissues: Negative   Disc levels:   T12-L1: Normal   L1-2: Disc bulge with a shallow right posterolateral herniation. Facet and ligamentous hypertrophy. Stenosis of the right lateral recess which could possibly cause right-sided neural compression. This is new since 2012.   L2-3: Disc degeneration newly seen since 2012. Moderate bulging of the disc. Facet and ligamentous hypertrophy. Mild stenosis of the canal and lateral recesses but without distinct neural compression. Findings at this level could possibly be symptomatic. Certainly the discogenic endplate changes could relate to regional back pain.   L3-4: Bulging of the disc. Facet and ligamentous hypertrophy. Slight worsening since 2012. Mild lateral recess stenosis but no distinct neural compression.   L4-5: Chronic fixed anterolisthesis of 7 mm. Posterior element fusion. Mild narrowing of the canal and lateral recesses but without neural compression. Posterior element fusion has occurred since 2012.   L5-S1: Previous posterior decompression. Fusion at the L5-S1 level. Sufficient patency of the canal and foramina. No significant change since 2012.   IMPRESSION: 1. L1-2: Disc bulge with a shallow right posterolateral herniation. Facet and ligamentous hypertrophy. Stenosis of the right lateral recess which could possibly cause right-sided neural compression. This is new since 2012. 2. L2-3: Disc degeneration newly seen since 2012. Moderate bulging of the disc. Facet  and ligamentous hypertrophy. Mild stenosis of the canal and lateral recesses but without distinct neural compression. Edematous discogenic endplate changes. Certainly, the discogenic endplate changes  could relate to regional back pain. 3. L3-4: Disc bulge. Facet and ligamentous hypertrophy. Mild lateral recess stenosis but no distinct neural compression. Slight worsening since 2012. 4. L4-5: Chronic fixed anterolisthesis of 7 mm. Posterior element fusion, newly seen since 2012. Mild narrowing of the canal and lateral recesses but without neural compression. 5. L5-S1: Previous posterior decompression and fusion. No compressive stenosis.     Electronically Signed   By: Paulina Fusi M.D.   On: 08/24/2020 10:18     Objective:  VS:  HT:    WT:   BMI:     BP:130/79  HR:80bpm  TEMP: ( )  RESP:  Physical Exam Vitals and nursing note reviewed.  Constitutional:      General: She is not in acute distress.    Appearance: Normal appearance. She is not ill-appearing.  HENT:     Head: Normocephalic and atraumatic.     Right Ear: External ear normal.     Left Ear: External ear normal.  Eyes:     Extraocular Movements: Extraocular movements intact.  Cardiovascular:     Rate and Rhythm: Normal rate.     Pulses: Normal pulses.  Pulmonary:     Effort: Pulmonary effort is normal. No respiratory distress.  Abdominal:     General: There is no distension.     Palpations: Abdomen is soft.  Musculoskeletal:        General: Tenderness present.     Cervical back: Neck supple.     Right lower leg: No edema.     Left lower leg: No edema.     Comments: Patient has good distal strength with no pain over the greater trochanters.  No clonus or focal weakness.  Skin:    Findings: No erythema, lesion or rash.  Neurological:     General: No focal deficit present.     Mental Status: She is alert and oriented to person, place, and time.     Sensory: No sensory deficit.     Motor: No weakness or abnormal muscle tone.     Coordination: Coordination normal.  Psychiatric:        Mood and Affect: Mood normal.        Behavior: Behavior normal.     Imaging: No results found.

## 2021-08-02 ENCOUNTER — Other Ambulatory Visit: Payer: Self-pay

## 2021-08-02 ENCOUNTER — Ambulatory Visit: Payer: Medicare Other | Attending: Orthopaedic Surgery | Admitting: Physical Therapy

## 2021-08-02 ENCOUNTER — Encounter: Payer: Self-pay | Admitting: Physical Therapy

## 2021-08-02 VITALS — BP 161/91 | HR 74

## 2021-08-02 DIAGNOSIS — R2689 Other abnormalities of gait and mobility: Secondary | ICD-10-CM | POA: Insufficient documentation

## 2021-08-02 DIAGNOSIS — M545 Low back pain, unspecified: Secondary | ICD-10-CM | POA: Insufficient documentation

## 2021-08-02 DIAGNOSIS — R252 Cramp and spasm: Secondary | ICD-10-CM | POA: Diagnosis present

## 2021-08-02 DIAGNOSIS — R293 Abnormal posture: Secondary | ICD-10-CM | POA: Insufficient documentation

## 2021-08-02 DIAGNOSIS — G8929 Other chronic pain: Secondary | ICD-10-CM | POA: Diagnosis present

## 2021-08-02 NOTE — Patient Instructions (Signed)
Access Code [email or web address]

## 2021-08-02 NOTE — Therapy (Signed)
[facility] [address] [place], [place], [address] Phone: [phone]   Fax:  [phone]  Physical Therapy Evaluation  Patient Details  Name: Kiara Little MRN: [id number] Date of Birth: 27-Apr-1936 Referring Provider (PT): Dr. Magnus Ivan   Encounter Date: 08/02/2021   PT End of Session - 08/02/21 1006     Visit Number 1    Number of Visits 16    Date for PT Re-Evaluation 09/27/21    Authorization Type UHC MCR    PT Start Time 0858   pt late   PT Stop Time 0940    PT Time Calculation (min) 42 min    Activity Tolerance Patient limited by pain    Behavior During Therapy [facility] for tasks assessed/performed             Past Medical History:  Diagnosis Date   Chronic back pain    COPD (chronic obstructive pulmonary disease) (HCC)    COVID-06 October 2019   Hyperlipidemia    Hypertension    Muscle spasm    Noncompliance    Osteoarthritis    a. s/p R THA   PAD (peripheral artery disease) (HCC)     Past Surgical History:  Procedure Laterality Date   ABDOMINAL AORTOGRAM W/LOWER EXTREMITY N/A 01/12/2021   Procedure: ABDOMINAL AORTOGRAM W/LOWER EXTREMITY;  Surgeon: Cephus Shelling, MD;  [facility]: [facility];  Service: Cardiovascular;  Laterality: N/A;   BACK SURGERY     Cataract surgeries Right 07/2019   JOINT REPLACEMENT  2000   RIGHT TOTAL HIP ARTHROPLASTY   PERIPHERAL VASCULAR BALLOON ANGIOPLASTY Right 01/12/2021   Procedure: PERIPHERAL VASCULAR BALLOON ANGIOPLASTY;  Surgeon: Cephus Shelling, MD;  Location: [facility];  Service: Cardiovascular;  Laterality: Right;  AT and SFA   SURGERY FOR TUBAL PREGNANCY     TOTAL HIP ARTHROPLASTY Left 07/24/2013   Procedure: LEFT TOTAL HIP ARTHROPLASTY ANTERIOR APPROACH;  Surgeon: Kathryne Hitch, MD;  Location: [facility];  Service: Orthopedics;  Laterality: Left;    Vitals:   08/02/21 0902  BP: (!) 161/91  Pulse: 74  SpO2: 99%       Subjective Assessment - 08/02/21 0904     Subjective Patient presents for L sided back and hip pain .   Her pain is chronic but a month ago her pain was made worse when she was driving to and from [place] She has L sided low back and hip pain .  She had an injection in her hip about 2 weeks ago with some benefit.  She denies sensory changes or weakness.  She has difficulty walking, standing or going up and down stairs.    Pertinent History R/L THA, COPD, PAD, lumbar fusion (no date : L4-L5?)    Limitations Lifting;House hold activities;Standing;Walking    How long can you sit comfortably? as needed    How long can you stand comfortably? 5 min    How long can you walk comfortably? 5 min    Diagnostic tests 08/2020: multilevel stenosis and spondylosis. Some listhesis, grade 1 at L4-5. which indeed shows grade 1 listhesis at L4-5 with severe facet arthrosis but also significant lateral recess stenosis. No high-grade foraminal stenosis. There is also some similar findings to a lesser degree at L3-4 including facet arthrosis. L5-S1 was without recurrent stenosis.    Patient Stated Goals To reduce pain and walk better and be active.    Currently in Pain? Yes  Pain Score 8     Pain Location Hip    Pain Orientation Left;Posterior;Lateral    Pain Descriptors / Indicators Aching;Sore    Pain Type Acute pain    Pain Radiating Towards L buttock    Pain Onset More than a month ago    Pain Frequency Intermittent    Aggravating Factors  standing, walking    Pain Relieving Factors sitting, rest, laying down, Voltaren    Multiple Pain Sites No                [facility] PT Assessment - 08/02/21 0001       Assessment   Medical Diagnosis L sided low back pain    Referring Provider (PT) Dr. 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    Leisure I go to church, TV      Cognition   Overall Cognitive Status Within Functional Limits for tasks assessed      Sensation   Light Touch Appears Intact      Coordination   Gross Motor Movements are Fluid and Coordinated Not tested      Posture/Postural Control   Posture/Postural Control Postural limitations    Postural Limitations Rounded Shoulders;Forward head;Flexed trunk    Posture Comments leans to Rt and knees flexed in standing      AROM   Overall AROM Comments Rt knee 0-10- 110, L 0-13-105      Palpation   Palpation comment pain throughout      Special Tests   Other special tests knee pain (ant ) with SLR      Transfers   Five time sit to stand comments  16 sec              Objective measurements completed on examination: See above findings.       PT Education - 08/02/21 1005     Education Details PT/POC, HEP, FOTO and eval findings    Person(s) Educated Patient    Methods Explanation;Demonstration    Comprehension Verbalized understanding;Need further instruction;Verbal cues required              PT Short Term Goals - 08/02/21 1007       PT SHORT TERM GOAL #1   Title pt to be I with inital HEP    Baseline given on eval    Time 4    Period Weeks    Status New    Target Date 08/30/21      PT SHORT TERM GOAL #2   Title pt to verbalize and demo proper posture and lifting/ gait mechanics to reduce and prevent  increased low back pain    Time 4    Period Weeks    Status New    Target Date 08/30/21      PT  SHORT TERM GOAL #3   Title Pt will understand FOTO result and how it measures outcomes , progress     Baseline 39%    Time 4    Period Weeks    Status New    Target Date 08/30/21               PT Long Term Goals - 08/02/21 1010       PT LONG TERM GOAL #1   Title Pt will be able to stand with full trunk extension (not beyond neutral) in standing with pain no more than minimal.    Baseline stands with about 30-35 deg trunk flexion due to pain    Time 8    Period Weeks    Status New    Target Date 09/27/21      PT LONG TERM GOAL #2   Title Pt will be able to sit to stand x 5 without use of hands and good LE alignment in 13 sec or less    Baseline 16 sec , needs hands, valgus and poor extension    Time 8    Period Weeks    Status New    Target Date 09/27/21      PT LONG TERM GOAL #3   Title Pt will be I with more advanced HEP and demo consistency    Time 8    Period Weeks    Status New    Target Date 09/27/21      PT LONG TERM GOAL #4   Title pt to be able to stand and walk for >/= 30 min for community ambulation and ADLs and for pt's personal goals of returning to regular activity    Time 8    Period Weeks    Status New    Target Date 09/27/21      PT LONG TERM GOAL #5   Title increase FOTO score to >/=  57% able  to demo improvement in functional mobility    Time 8    Period Weeks    Status New    Target Date 09/27/21                    Plan - 08/02/21 0949     Clinical Impression Statement Patient presents for mod compleoxty eval of L sided low back and hip pain which became acute after a several hours in a car.  She had increased L sided back pain with Rt SLR (nerve tensioning).  Seen previously for Neurosurgery but declined surgey at that time. She presents with abnormal posture and difficulty walking, poor endurance and BP was elevated today. She had difficulty on the treatment table tolerating positional changes.  She has decent knee strength but hips and core are weak.  She does report improvement in the past with PT but after weeks of stopped  PT she lost momentum and discontinued her structured HEP, activity. She will benefit from skilled PT to improve her qualityof life and positively impact her functional mobility.    Personal Factors and Comorbidities Age;Comorbidity 3+;Past/Current Experience    Comorbidities chronic pain, OA, HTN, PAD and history of bilateral THA, previous back surgery    Examination-Activity Limitations Lift;Stand;Bed Mobility;Locomotion Level;Bend;Reach Overhead;Transfers;Carry;Squat;Stairs    Examination-Participation Restrictions Church;Cleaning;Community Activity;Occupation;Interpersonal Relationship;Laundry;Shop    Stability/Clinical Decision Making Evolving/Moderate complexity    Clinical Decision Making Moderate    Rehab Potential Good    PT Frequency 2x / week    PT Duration 8 weeks  PT Treatment/Interventions ADLs/Self Care Home Management;DME Instruction;Balance training;Passive range of motion;Dry needling;Neuromuscular re-education;Gait training;Moist Heat;Functional mobility training;Manual techniques;Cryotherapy;Electrical Stimulation;Therapeutic exercise;Therapeutic activities;Patient/family education    PT Next Visit Plan check HEP, use wedge in supine. postural stretching as tol.  2 min walk test? Hip and core strength    PT Home Exercise Plan Access Code [email or web address]    Consulted and Agree with Plan of Care Patient             Patient will benefit from skilled therapeutic intervention in order to improve the following deficits and impairments:  Abnormal gait, Cardiopulmonary status limiting activity, Decreased activity tolerance, Difficulty walking, Impaired flexibility, Decreased endurance, Decreased range of motion, Decreased strength, Postural dysfunction, Decreased mobility, Increased fascial restricitons, Pain  Visit Diagnosis: Chronic bilateral low back pain without sciatica  Other abnormalities of gait and mobility  Abnormal posture  Cramp and spasm     Problem List Patient  Active Problem List   Diagnosis Date Noted   Dystrophia unguium 12/27/2020   Pain in right foot 12/27/2020   Lumbar spondylosis 10/26/2020   Body mass index (BMI) 31.0-31.9, adult 08/08/2020   Chronic low back pain 08/08/2020   Other chronic pain 08/08/2020   Cock-up deformity of great toe 03/11/2020   Dry eyes 03/25/2019   Atherosclerosis of native arteries of extremity with intermittent claudication (HCC) 11/13/2017   PAD (peripheral artery disease) (HCC) 07/26/2015   Pain in joint, lower leg 08/02/2014   Stiffness of joint, not elsewhere classified, pelvic region and thigh 04/30/2014   Muscle weakness (generalized) 04/30/2014   Degenerative arthritis of left hip 07/17/2013   HTN (hypertension) 02/05/2013   Chest pain, atypical 02/05/2013   Anxiety 02/05/2013   Nicotine abuse 02/05/2013    Manika Hast, PT 08/02/2021, 12:02 PM  [facility] [address] [place], [place], [address] Phone: [phone]   Fax:  [phone]  Name: Kiara Little MRN: [id number] Date of Birth: [birth year]  Karie Mainland, PT 08/02/21 12:02 PM Phone: [phone] Fax: [phone]

## 2021-08-02 NOTE — Addendum Note (Signed)
Addended by: Karie Mainland L on: 08/02/2021 12:05 PM   Modules accepted: Orders

## 2021-08-09 ENCOUNTER — Ambulatory Visit: Payer: Medicare Other | Admitting: Rehabilitative and Restorative Service Providers"

## 2021-08-16 ENCOUNTER — Encounter: Payer: Self-pay | Admitting: Physical Therapy

## 2021-08-16 ENCOUNTER — Ambulatory Visit: Payer: Medicare Other | Admitting: Physical Therapy

## 2021-08-16 ENCOUNTER — Other Ambulatory Visit: Payer: Self-pay

## 2021-08-16 DIAGNOSIS — M545 Low back pain, unspecified: Secondary | ICD-10-CM | POA: Diagnosis not present

## 2021-08-16 DIAGNOSIS — R2689 Other abnormalities of gait and mobility: Secondary | ICD-10-CM

## 2021-08-16 DIAGNOSIS — R293 Abnormal posture: Secondary | ICD-10-CM

## 2021-08-16 DIAGNOSIS — G8929 Other chronic pain: Secondary | ICD-10-CM

## 2021-08-16 NOTE — Therapy (Addendum)
[place], [place], [address] Phone: [phone]   Fax:  [phone]  Physical Therapy Treatment/Discharge  Patient Details  Name: Kiara Little MRN: [id number] Date of Birth: [birth year] Referring Provider (PT): Dr. Ninfa Linden   Encounter Date: 08/16/2021   PT End of Session - 08/16/21 0813     Visit Number 2    Number of Visits 16    Date for PT Re-Evaluation 09/27/21    Authorization Type UHC MCR    PT Start Time 0802    PT Stop Time 0845    PT Time Calculation (min) 43 min             Past Medical History:  Diagnosis Date   Chronic back pain    COPD (chronic obstructive pulmonary disease) ([place])    COVID-06 October 2019   Hyperlipidemia    Hypertension    Muscle spasm    Noncompliance    Osteoarthritis    a. s/p R THA   PAD (peripheral artery disease) ([place])     Past Surgical History:  Procedure Laterality Date   ABDOMINAL AORTOGRAM W/LOWER EXTREMITY N/A 01/12/2021   Procedure: ABDOMINAL AORTOGRAM W/LOWER EXTREMITY;  Surgeon: Marty Heck, MD;  Location: [place] [facility];  Service: Cardiovascular;  Laterality: N/A;   BACK SURGERY     Cataract surgeries Right 07/2019   JOINT REPLACEMENT  2000   RIGHT TOTAL HIP ARTHROPLASTY   PERIPHERAL VASCULAR BALLOON ANGIOPLASTY Right 01/12/2021   Procedure: PERIPHERAL VASCULAR BALLOON ANGIOPLASTY;  Surgeon: Marty Heck, MD;  Location: [place] [facility];  Service: Cardiovascular;  Laterality: Right;  AT and SFA   SURGERY FOR TUBAL PREGNANCY     TOTAL HIP ARTHROPLASTY Left 07/24/2013   Procedure: LEFT TOTAL HIP ARTHROPLASTY ANTERIOR APPROACH;  Surgeon: Mcarthur Rossetti, MD;  Location: [facility];  Service: Orthopedics;  Laterality: Left;    There were no vitals filed for this visit.   Subjective Assessment - 08/16/21 0806     Subjective There is so much to do with household aggravations and my children.    Diagnostic tests  08/2020: multilevel stenosis and spondylosis. Some listhesis, grade 1 at L4-5. which indeed shows grade 1 listhesis at L4-5 with severe facet arthrosis but also significant lateral recess stenosis. No high-grade foraminal stenosis. There is also some similar findings to a lesser degree at L3-4 including facet arthrosis. L5-S1 was without recurrent stenosis.            [place] Adult PT Treatment/Exercise:  Therapeutic Exercise: - Nustep L4 x 5 minutes -Sit-stand x 10 without UE- cues to come to upright posture -LTR x 10 -SKTC 3 x 30 sec -red band clam x 30 -red band marching x 30 -pelvic tilt 5 sec x 10             PT Short Term Goals - 08/02/21 1007       PT SHORT TERM GOAL #1   Title pt to be I with inital HEP    Baseline given on eval    Time 4    Period Weeks    Status New    Target Date 08/30/21      PT SHORT TERM GOAL #2   Title pt to verbalize and demo proper posture and lifting/ gait mechanics to reduce and prevent  increased low back pain    Time 4    Period Weeks    Status New  Target Date 08/30/21      PT SHORT TERM GOAL #3   Title Pt will understand FOTO result and how it measures outcomes , progress    Baseline 39%    Time 4    Period Weeks    Status New    Target Date 08/30/21               PT Long Term Goals - 08/02/21 1010       PT LONG TERM GOAL #1   Title Pt will be able to stand with full trunk extension (not beyond neutral) in standing with pain no more than minimal.    Baseline stands with about 30-35 deg trunk flexion due to pain    Time 8    Period Weeks    Status New    Target Date 09/27/21      PT LONG TERM GOAL #2   Title Pt will be able to sit to stand x 5 without use of hands and good LE alignment in 13 sec or less    Baseline 16 sec , needs hands, valgus and poor extension    Time 8    Period Weeks    Status New    Target Date 09/27/21      PT LONG TERM GOAL #3   Title Pt will be I with more advanced HEP and  demo consistency    Time 8    Period Weeks    Status New    Target Date 09/27/21      PT LONG TERM GOAL #4   Title pt to be able to stand and walk for >/= 30 min for community ambulation and ADLs and for pt's personal goals of returning to regular activity    Time 8    Period Weeks    Status New    Target Date 09/27/21      PT LONG TERM GOAL #5   Title increase FOTO score to >/=  57% able  to demo improvement in functional mobility    Time 8    Period Weeks    Status New    Target Date 09/27/21                   Plan - 08/16/21 1015     Clinical Impression Statement Kiara Little reports compliance with HEP. She reports emotional distress at home. Reviewed HEP and began Nustep and gentle hip/ core strengthening. She tolerated session well without c/o pain.    PT Treatment/Interventions ADLs/Self Care Home Management;DME Instruction;Balance training;Passive range of motion;Dry needling;Neuromuscular re-education;Gait training;Moist Heat;Functional mobility training;Manual techniques;Cryotherapy;Electrical Stimulation;Therapeutic exercise;Therapeutic activities;Patient/family education    PT Next Visit Plan check HEP, use wedge in supine. postural stretching as tol.  2 min walk test? Hip and core strength    PT Home Exercise Plan Access Code [email or web address]             Patient will benefit from skilled therapeutic intervention in order to improve the following deficits and impairments:  Abnormal gait, Cardiopulmonary status limiting activity, Decreased activity tolerance, Difficulty walking, Impaired flexibility, Decreased endurance, Decreased range of motion, Decreased strength, Postural dysfunction, Decreased mobility, Increased fascial restricitons, Pain  Visit Diagnosis: Chronic bilateral low back pain without sciatica  Other abnormalities of gait and mobility  Abnormal posture     Problem List Patient Active Problem List   Diagnosis Date Noted   Dystrophia unguium  12/27/2020   Pain in right foot 12/27/2020   Lumbar spondylosis 10/26/2020  Body mass index (BMI) 31.0-31.9, adult 08/08/2020   Chronic low back pain 08/08/2020   Other chronic pain 08/08/2020   Cock-up deformity of great toe 03/11/2020   Dry eyes 03/25/2019   Atherosclerosis of native arteries of extremity with intermittent claudication ([place]) 11/13/2017   PAD (peripheral artery disease) ([place]) 07/26/2015   Pain in joint, lower leg 08/02/2014   Stiffness of joint, not elsewhere classified, pelvic region and thigh 04/30/2014   Muscle weakness (generalized) 04/30/2014   Degenerative arthritis of left hip 07/17/2013   HTN (hypertension) 02/05/2013   Chest pain, atypical 02/05/2013   Anxiety 02/05/2013   Nicotine abuse 02/05/2013    Dorene Ar, PTA 08/16/2021, 10:16 AM  [facility] [address] [place], [place], [address] Phone: [phone]   Fax:  [phone]  Name: Kiara Little MRN: [id number] Date of Birth: 1935/11/20  PHYSICAL THERAPY DISCHARGE SUMMARY  Visits from Start of Care: 2  Current functional level related to goals / functional outcomes: unknown   Remaining deficits: unknown   Education / Equipment: HEP    Patient agrees to discharge. Patient goals were not met. Patient is being discharged due to not returning since the last visit.  Raeford Razor, PT 09/21/21 10:13 AM Phone: [phone] Fax: [phone]

## 2021-08-21 ENCOUNTER — Ambulatory Visit: Payer: Medicare Other | Admitting: Physical Therapy

## 2021-08-21 ENCOUNTER — Encounter: Payer: Medicare Other | Admitting: Physical Therapy

## 2021-08-23 ENCOUNTER — Ambulatory Visit (INDEPENDENT_AMBULATORY_CARE_PROVIDER_SITE_OTHER): Payer: Medicare Other | Admitting: Orthopaedic Surgery

## 2021-08-23 ENCOUNTER — Other Ambulatory Visit: Payer: Self-pay

## 2021-08-23 ENCOUNTER — Encounter: Payer: Self-pay | Admitting: Orthopaedic Surgery

## 2021-08-23 DIAGNOSIS — G8929 Other chronic pain: Secondary | ICD-10-CM

## 2021-08-23 DIAGNOSIS — M5441 Lumbago with sciatica, right side: Secondary | ICD-10-CM

## 2021-08-23 NOTE — Progress Notes (Signed)
The patient comes in today having improved with her left-sided low back pain and sciatica as well as left hip pain.  She complains of right-sided sciatica now.  She has a remote history of a spinal decompression at L5-S1 by Dr. Newell Coral.  A MRI a year ago showed no compressive stenosis at that level.  She points to the posterior pelvis and the ischium and the lower back to the right side as a source of her pain today.  There is no radicular component.  She says it hurts to sit down on that area.  I can easily put her right hip to internal and external rotation with no pain in the hip and no pain of the trochanteric area of the hip.  She does have pain to palpation at the posterior pelvis near the SI joint and ischial area.  Some of this may be facet joint mediated as well.  My next step will for her will be referral to Dr. Alvester Morin to consider a right-sided SI joint injection versus a right L5-S1 injection but this will depend on the point of maximal tenderness that he can find to inject in this area.  She agrees with that referral.

## 2021-08-28 ENCOUNTER — Encounter: Payer: Medicare Other | Admitting: Physical Therapy

## 2021-08-29 ENCOUNTER — Encounter: Payer: Self-pay | Admitting: Physician Assistant

## 2021-08-29 ENCOUNTER — Ambulatory Visit (INDEPENDENT_AMBULATORY_CARE_PROVIDER_SITE_OTHER): Payer: Medicare Other | Admitting: Physician Assistant

## 2021-08-29 ENCOUNTER — Other Ambulatory Visit: Payer: Self-pay

## 2021-08-29 ENCOUNTER — Ambulatory Visit (HOSPITAL_COMMUNITY)
Admission: RE | Admit: 2021-08-29 | Discharge: 2021-08-29 | Disposition: A | Discharge status: Home / Self Care | Payer: Medicare Other | Source: Ambulatory Visit | Attending: Physician Assistant | Admitting: Physician Assistant

## 2021-08-29 ENCOUNTER — Ambulatory Visit (INDEPENDENT_AMBULATORY_CARE_PROVIDER_SITE_OTHER)
Admission: RE | Admit: 2021-08-29 | Discharge: 2021-08-29 | Disposition: A | Discharge status: Home / Self Care | Payer: Medicare Other | Source: Ambulatory Visit | Attending: Physician Assistant | Admitting: Physician Assistant

## 2021-08-29 VITALS — BP 150/80 | HR 53 | Temp 97.5°F | Wt 209.0 lb

## 2021-08-29 DIAGNOSIS — I739 Peripheral vascular disease, unspecified: Secondary | ICD-10-CM

## 2021-08-29 NOTE — Progress Notes (Signed)
Office Note     CC:  follow up Requesting Provider:  Raymon Mutton., FNP  HPI: Kiara Little is a 85 y.o. (July 08, 1936) female who presents for follow up of peripheral artery disease. She most recently underwent an Aortogram with right anterior tibial angioplasty via retrograde right AT approach at the ankle and right SFA angioplasty via retrograde right AT approach at ankle by Dr. Chestine Spore on 01/12/21. This was performed due to CLI with non healing right great toe wound.  At the time of her last visit in May she was doing great. She had been cleared by her Podiatrist and her right great toe wound was doing well. She was without rest pain. She had some swelling in the leg and foot which was improved with elevating daily. She did not have any claudication, or new wounds. She was compliant with her Aspirin, statin and Plavix  Today she says she overall is good. She explains that her legs just feel weak. One leg is not worse than the other. She also says she has neuropathy so that bothers her as well as restless legs at night. She explains that she was taking Gabapentin but she felt no improvement so she stopped taking it. She otherwise denies any pain at rest. Her wounds remain healed and no new wounds. Still has some soreness in right great toe but it remains healed.    The pt is on a statin for cholesterol management.  The pt is on a daily aspirin.   Other AC:  Plavix The pt is on CCB for hypertension.   The pt is not diabetic.  Tobacco hx: former, quit 2014   Past Medical History:  Diagnosis Date   Chronic back pain    COPD (chronic obstructive pulmonary disease) (HCC)    COVID-06 October 2019   Hyperlipidemia    Hypertension    Muscle spasm    Noncompliance    Osteoarthritis    a. s/p R THA   PAD (peripheral artery disease) (HCC)     Past Surgical History:  Procedure Laterality Date   ABDOMINAL AORTOGRAM W/LOWER EXTREMITY N/A 01/12/2021   Procedure: ABDOMINAL AORTOGRAM  W/LOWER EXTREMITY;  Surgeon: Cephus Shelling, MD;  Location: [facility];  Service: Cardiovascular;  Laterality: N/A;   BACK SURGERY     Cataract surgeries Right 07/2019   JOINT REPLACEMENT  2000   RIGHT TOTAL HIP ARTHROPLASTY   PERIPHERAL VASCULAR BALLOON ANGIOPLASTY Right 01/12/2021   Procedure: PERIPHERAL VASCULAR BALLOON ANGIOPLASTY;  Surgeon: Cephus Shelling, MD;  Location: [facility];  Service: Cardiovascular;  Laterality: Right;  AT and SFA   SURGERY FOR TUBAL PREGNANCY     TOTAL HIP ARTHROPLASTY Left 07/24/2013   Procedure: LEFT TOTAL HIP ARTHROPLASTY ANTERIOR APPROACH;  Surgeon: Kathryne Hitch, MD;  Location: [facility];  Service: Orthopedics;  Laterality: Left;    Social History   Socioeconomic History   Marital status: Legally Separated    Spouse name: Not on file   Number of children: Not on file   Years of education: Not on file   Highest education level: Not on file  Occupational History   Not on file  Tobacco Use   Smoking status: Former    Packs/day: 0.50    Years: 40.00    Pack years: 20.00    Types: Cigarettes    Quit date: 07/24/2013    Years since quitting: 8.1   Smokeless tobacco: Never  Vaping  Use   Vaping Use: Never used  Substance and Sexual Activity   Alcohol use: No   Drug use: No   Sexual activity: Not on file  Other Topics Concern   Not on file  Social History Narrative   Pt lives in [place] with her son.  She is retired.  She does not exercise.   Social Determinants of Health   Financial Resource Strain: Not on file  Food Insecurity: Not on file  Transportation Needs: Not on file  Physical Activity: Not on file  Stress: Not on file  Social Connections: Not on file  Intimate Partner Violence: Not on file    Family History  Problem Relation Age of Onset   Heart attack Mother    Diabetes Father    Cancer Brother     Current Outpatient Medications  Medication Sig Dispense Refill   amLODipine (NORVASC)  10 MG tablet Take 10 mg by mouth every morning.     aspirin EC 81 MG tablet Take 1 tablet (81 mg total) by mouth daily. Swallow whole. 150 tablet 2   atorvastatin (LIPITOR) 10 MG tablet TAKE 1 TABLET BY MOUTH EVERY DAY (Patient taking differently: Take 10 mg by mouth daily.) 90 tablet 3   azelastine (ASTELIN) 0.1 % nasal spray Place 1 spray into both nostrils 2 (two) times daily.     clopidogrel (PLAVIX) 75 MG tablet Take 1 tablet (75 mg total) by mouth daily. 30 tablet 11   D3-50 1.25 MG (50000 UT) capsule Take 50,000 Units by mouth daily.     [START ON 12/16/2021] doxycycline (VIBRAMYCIN) 100 MG capsule Take 1 capsule (100 mg total) by mouth 2 (two) times daily for 10 days. 20 capsule 0   HYDROcodone-acetaminophen (NORCO) 7.5-325 MG tablet Take 1 tablet by mouth every 8 (eight) hours as needed.     methocarbamol (ROBAXIN) 500 MG tablet Take 1 tablet by mouth 2 (two) times daily.     pantoprazole (PROTONIX) 40 MG tablet      predniSONE (DELTASONE) 50 MG tablet Take one tablet daily for 5 days. 5 tablet 0   No current facility-administered medications for this visit.    Allergies  Allergen Reactions   Amphetamine Itching   Ibuprofen Hives and Rash    Pt states "It makes me break out on my backside"     REVIEW OF SYSTEMS:  [X]  denotes positive finding, [ ]  denotes negative finding Cardiac  Comments:  Chest pain or chest pressure:    Shortness of breath upon exertion:    Short of breath when lying flat:    Irregular heart rhythm:        Vascular    Pain in calf, thigh, or hip brought on by ambulation:    Pain in feet at night that wakes you up from your sleep:     Blood clot in your veins:    Leg swelling:  X       Pulmonary    Oxygen at home:    Productive cough:     Wheezing:         Neurologic    Sudden weakness in arms or legs:     Sudden numbness in arms or legs:     Sudden onset of difficulty speaking or slurred speech:    Temporary loss of vision in one eye:      Problems with dizziness:         Gastrointestinal    Blood in stool:  Vomited blood:         Genitourinary    Burning when urinating:     Blood in urine:        Psychiatric    Major depression:         Hematologic    Bleeding problems:    Problems with blood clotting too easily:        Skin    Rashes or ulcers:        Constitutional    Fever or chills:      PHYSICAL EXAMINATION:  Vitals:   08/29/21 1009  BP: (!) 150/80  Pulse: (!) 53  Temp: (!) 97.5 F (36.4 C)  TempSrc: Skin  SpO2: 96%  Weight: 209 lb (94.8 kg)    General:  WDWN in NAD; vital signs documented above Gait: Normal HENT: WNL, normocephalic Pulmonary: normal non-labored breathing , without wheezing Cardiac: regular HR, without  Murmurs without carotid bruit Abdomen: soft, NT, no masses Vascular Exam/Pulses:  Right Left  Radial 2+ (normal) 2+ (normal)  Femoral 2+ (normal) 2+ (normal)  Popliteal 2+ (normal) 2+ (normal)  DP absent absent  PT absent absent  Doppler right DP and faint peroneal. Doppler left DP/ PT Extremities: without ischemic changes, without Gangrene , without cellulitis; without open wounds;  Musculoskeletal: no muscle wasting or atrophy  Neurologic: A&O X 3;  No focal weakness or paresthesias are detected Psychiatric:  The pt has Normal affect.   Non-Invasive Vascular Imaging:  08/29/21 +-------+-----------+-----------+------------+------------+   ABI/TBI Today's ABI Today's TBI Previous ABI Previous TBI   +-------+-----------+-----------+------------+------------+   Right   0.76        0.58        0.94         0.5            +-------+-----------+-----------+------------+------------+   Left    0.94        0.47        0.61         0.35           +-------+-----------+-----------+------------+------------+   VAS [place] Lower Extremity Right: +-----------+--------+-----+---------------+-------------------+--------+   RIGHT       PSV cm/s Ratio Stenosis        Waveform             Comments   +-----------+--------+-----+---------------+-------------------+--------+   CFA Prox    81                             triphasic                      +-----------+--------+-----+---------------+-------------------+--------+   DFA         78                             triphasic                      +-----------+--------+-----+---------------+-------------------+--------+   SFA Prox    70                             triphasic                      +-----------+--------+-----+---------------+-------------------+--------+   SFA Mid     66  triphasic                      +-----------+--------+-----+---------------+-------------------+--------+   SFA Distal  86             30-49% stenosis triphasic                      +-----------+--------+-----+---------------+-------------------+--------+   POP Prox    50                             monophasic                     +-----------+--------+-----+---------------+-------------------+--------+   POP Distal  37                                                            +-----------+--------+-----+---------------+-------------------+--------+   ATA Distal  21                             monophasic                     +-----------+--------+-----+---------------+-------------------+--------+   PTA Prox                                   dampened monophasic            +-----------+--------+-----+---------------+-------------------+--------+   PTA Distal                                 absent                         +-----------+--------+-----+---------------+-------------------+--------+   PERO Distal 38                             monophasic                     +-----------+--------+-----+---------------+-------------------+--------+   Higher grade stenosis maybe be obscured.   Summary:  Right: 30-49% stenosis noted in the superficial femoral artery and/or popliteal artery.   ASSESSMENT/PLAN:: 85 y.o. female  here for follow up for PAD. She is s/p Aortogram with right anterior tibial angioplasty via retrograde right AT approach at the ankle and right SFA angioplasty via retrograde right AT approach at ankle by Dr. Chestine Spore on 01/12/21. This was performed due to CLI with non healing right great toe wound. Her wounds remain healed. She has weakness but does not describe claudication or rest pain. No new wounds. - Her ABI today is improved on the LLE and decreased on the RLE compared to her prior study - Her RLE duplex shows patent left SFA and monophasic flow in tibial vessels - She has known bilateral severe tibial disease from her angiography in April - Encouraged her to exercise to help promote collaterals - Discussed importance of keeping her feet protected and frequent inspection for new wounds - Continue Aspirin, Statin, Plavix - Follow up in 6 months with ABI - she  will call for earlier follow up if she has new or worsening symptoms   Graceann Congress, PA-C [facility] [phone]  Clinic MD:  Steve Rattler

## 2021-08-30 ENCOUNTER — Other Ambulatory Visit: Payer: Self-pay

## 2021-08-30 DIAGNOSIS — I739 Peripheral vascular disease, unspecified: Secondary | ICD-10-CM

## 2021-11-07 ENCOUNTER — Other Ambulatory Visit: Payer: Self-pay | Admitting: Vascular Surgery

## 2022-01-05 ENCOUNTER — Other Ambulatory Visit: Payer: Self-pay | Admitting: Vascular Surgery

## 2022-01-18 ENCOUNTER — Other Ambulatory Visit: Payer: Self-pay | Admitting: Student

## 2022-01-18 DIAGNOSIS — R109 Unspecified abdominal pain: Secondary | ICD-10-CM

## 2022-01-18 DIAGNOSIS — M545 Low back pain, unspecified: Secondary | ICD-10-CM

## 2022-01-19 ENCOUNTER — Ambulatory Visit
Admission: RE | Admit: 2022-01-19 | Discharge: 2022-01-19 | Disposition: A | Discharge status: Home / Self Care | Payer: Medicare Other | Source: Ambulatory Visit | Attending: Student | Admitting: Student

## 2022-01-19 DIAGNOSIS — M545 Low back pain, unspecified: Secondary | ICD-10-CM

## 2022-01-19 DIAGNOSIS — R109 Unspecified abdominal pain: Secondary | ICD-10-CM

## 2022-02-26 ENCOUNTER — Other Ambulatory Visit: Payer: Self-pay | Admitting: Family

## 2022-02-26 DIAGNOSIS — Z1231 Encounter for screening mammogram for malignant neoplasm of breast: Secondary | ICD-10-CM

## 2022-03-15 ENCOUNTER — Telehealth: Payer: Self-pay

## 2022-03-15 NOTE — Telephone Encounter (Signed)
Pt called requesting an asap appt, but did not specify the reason.  Reviewed pt's chart, returned pt's call, two identifiers used. Pt stated that she has a bruise above her R knee, her ankles are bruised, and she has a tight/stinging sensation in her BLE. She denies any trauma to the leg, no swelling, and no open areas. She has been wearing her compression hose. She was instructed to continue what she's been doing, elevate her legs when able, and monitor for any worsening symptoms. Pt confirmed understanding.

## 2022-03-16 ENCOUNTER — Ambulatory Visit
Admission: RE | Admit: 2022-03-16 | Discharge: 2022-03-16 | Disposition: A | Discharge status: Home / Self Care | Payer: Medicare Other | Source: Ambulatory Visit | Attending: Family | Admitting: Family

## 2022-03-16 DIAGNOSIS — Z1231 Encounter for screening mammogram for malignant neoplasm of breast: Secondary | ICD-10-CM

## 2022-03-22 ENCOUNTER — Other Ambulatory Visit: Payer: Self-pay | Admitting: Family

## 2022-03-22 DIAGNOSIS — R928 Other abnormal and inconclusive findings on diagnostic imaging of breast: Secondary | ICD-10-CM

## 2022-03-30 ENCOUNTER — Ambulatory Visit
Admission: RE | Admit: 2022-03-30 | Discharge: 2022-03-30 | Disposition: A | Discharge status: Home / Self Care | Payer: Medicare Other | Source: Ambulatory Visit | Attending: Family | Admitting: Family

## 2022-03-30 ENCOUNTER — Ambulatory Visit: Payer: Medicare Other

## 2022-03-30 DIAGNOSIS — R928 Other abnormal and inconclusive findings on diagnostic imaging of breast: Secondary | ICD-10-CM

## 2022-04-04 ENCOUNTER — Encounter: Payer: Self-pay | Admitting: Vascular Surgery

## 2022-04-04 ENCOUNTER — Ambulatory Visit (INDEPENDENT_AMBULATORY_CARE_PROVIDER_SITE_OTHER): Payer: Medicare Other

## 2022-04-04 ENCOUNTER — Ambulatory Visit (INDEPENDENT_AMBULATORY_CARE_PROVIDER_SITE_OTHER): Payer: Medicare Other | Admitting: Vascular Surgery

## 2022-04-04 VITALS — BP 168/83 | HR 60 | Temp 98.2°F | Resp 14 | Ht 69.0 in | Wt 202.6 lb

## 2022-04-04 DIAGNOSIS — I739 Peripheral vascular disease, unspecified: Secondary | ICD-10-CM

## 2022-04-04 NOTE — Progress Notes (Signed)
[facility] [place]  Patient name: Kiara Little MRN: [id number] DOB: [birth year] Sex: female  REASON FOR VISIT: Follow-up peripheral vascular occlusive disease  HPI: Kiara Little is a 86 y.o. female here today for follow-up.  She had prior history of right great toe ulceration.  She underwent retrograde approach through the right anterior tibial artery and underwent right anterior tibial and superficial femoral artery angioplasty in April 2022.  She was able to have complete healing of her right toe ulcer following this.  She specifically denies any claudication type symptoms.  She does remain active.  She reports burning and stinging sensation in her feet bilaterally which is worse at night.  Past Medical History:  Diagnosis Date   Chronic back pain    COPD (chronic obstructive pulmonary disease) (HCC)    COVID-06 October 2019   Hyperlipidemia    Hypertension    Muscle spasm    Noncompliance    Osteoarthritis    a. s/p R THA   PAD (peripheral artery disease) (HCC)     Family History  Problem Relation Age of Onset   Heart attack Mother    Diabetes Father    Cancer Brother     SOCIAL HISTORY: Social History   Tobacco Use   Smoking status: Former    Packs/day: 0.50    Years: 40.00    Total pack years: 20.00    Types: Cigarettes    Quit date: 07/24/2013    Years since quitting: 8.7    Passive exposure: Never   Smokeless tobacco: Never  Substance Use Topics   Alcohol use: No    Allergies  Allergen Reactions   Amphetamine Itching   Ibuprofen Hives and Rash    Pt states "It makes me break out on my backside"    Current Outpatient Medications  Medication Sig Dispense Refill   amLODipine (NORVASC) 10 MG tablet Take 10 mg by mouth every morning.     clopidogrel (PLAVIX) 75 MG tablet TAKE 1 TABLET BY MOUTH EVERY DAY 90 tablet 3   D3-50 1.25 MG (50000 UT) capsule Take 50,000 Units by mouth daily.      HYDROcodone-acetaminophen (NORCO) 7.5-325 MG tablet Take 1 tablet by mouth every 8 (eight) hours as needed.     pantoprazole (PROTONIX) 40 MG tablet      atorvastatin (LIPITOR) 10 MG tablet TAKE 1 TABLET BY MOUTH EVERY DAY (Patient not taking: Reported on 04/04/2022) 90 tablet 3   azelastine (ASTELIN) 0.1 % nasal spray Place 1 spray into both nostrils 2 (two) times daily. (Patient not taking: Reported on 04/04/2022)     methocarbamol (ROBAXIN) 500 MG tablet Take 1 tablet by mouth 2 (two) times daily. (Patient not taking: Reported on 04/04/2022)     predniSONE (DELTASONE) 50 MG tablet Take one tablet daily for 5 days. (Patient not taking: Reported on 04/04/2022) 5 tablet 0   No current facility-administered medications for this visit.    REVIEW OF SYSTEMS:  [X]  denotes positive finding, [ ]  denotes negative finding Cardiac  Comments:  Chest pain or chest pressure:    Shortness of breath upon exertion:    Short of breath when lying flat:    Irregular heart rhythm:        Vascular    Pain in calf, thigh, or hip brought on by ambulation:    Pain in feet at night that wakes you up from your sleep:     Blood clot  in your veins:    Leg swelling:           PHYSICAL EXAM: Vitals:   04/04/22 0913  BP: (!) 168/83  Pulse: 60  Resp: 14  Temp: 98.2 F (36.8 C)  TempSrc: Temporal  SpO2: 98%  Weight: 202 lb 9.6 oz (91.9 kg)  Height: 5\' 9"  (1.753 m)    GENERAL: The patient is a well-nourished female, in no acute distress. The vital signs are documented above. CARDIOVASCULAR: 2+ radial pulses bilaterally.  I do not palpate popliteal or distal pulses bilaterally. PULMONARY: There is good air exchange  MUSCULOSKELETAL: There are no major deformities or cyanosis. NEUROLOGIC: No focal weakness or paresthesias are detected. SKIN: There are no ulcers or rashes noted. PSYCHIATRIC: The patient has a normal affect.  DATA:  Noninvasive studies today reveal ankle arm index of 0.59 with monophasic  waveform on the right and 0.72 with monophasic waveform on the left  MEDICAL ISSUES: No evidence of critical limb ischemia.  I do not palpate popliteal pulse on the right.  She may have had renarrowing of her superficial femoral angioplasty site.  I discussed this with the patient.  Explained that she was able to heal her ulceration and we would not recommend any invasive evaluation or intervention unless she develops signs of critical limb ischemia.  She will continue her usual activities and we will see her again in 1 year with repeat noninvasive studies    , MD FACS [facility] [place] Office Tel [phone]  Note: Portions of this report may have been transcribed using voice recognition software.  Every effort has been made to ensure accuracy; however, inadvertent computerized transcription errors may still be present.

## 2022-04-10 ENCOUNTER — Encounter (HOSPITAL_COMMUNITY): Payer: Medicare Other

## 2022-04-10 ENCOUNTER — Ambulatory Visit: Payer: Medicare Other | Admitting: Vascular Surgery

## 2022-05-25 ENCOUNTER — Other Ambulatory Visit: Payer: Self-pay | Admitting: Family

## 2022-05-25 ENCOUNTER — Other Ambulatory Visit (HOSPITAL_COMMUNITY): Payer: Self-pay | Admitting: Family

## 2022-05-25 DIAGNOSIS — M25562 Pain in left knee: Secondary | ICD-10-CM

## 2022-05-25 DIAGNOSIS — M545 Low back pain, unspecified: Secondary | ICD-10-CM

## 2022-05-25 DIAGNOSIS — M25561 Pain in right knee: Secondary | ICD-10-CM

## 2022-06-15 ENCOUNTER — Ambulatory Visit (HOSPITAL_COMMUNITY): Admission: RE | Admit: 2022-06-15 | Payer: Medicare Other | Source: Ambulatory Visit

## 2022-06-15 ENCOUNTER — Ambulatory Visit (HOSPITAL_COMMUNITY): Payer: Medicare Other

## 2022-06-15 ENCOUNTER — Encounter (HOSPITAL_COMMUNITY): Payer: Self-pay

## 2022-07-10 ENCOUNTER — Ambulatory Visit (HOSPITAL_COMMUNITY)
Admission: RE | Admit: 2022-07-10 | Discharge: 2022-07-10 | Disposition: A | Discharge status: Home / Self Care | Payer: Medicare Other | Source: Ambulatory Visit | Attending: Family | Admitting: Family

## 2022-07-10 DIAGNOSIS — M25562 Pain in left knee: Secondary | ICD-10-CM | POA: Diagnosis present

## 2022-07-10 DIAGNOSIS — M25561 Pain in right knee: Secondary | ICD-10-CM | POA: Insufficient documentation

## 2022-07-10 DIAGNOSIS — M545 Low back pain, unspecified: Secondary | ICD-10-CM | POA: Insufficient documentation

## 2022-07-25 ENCOUNTER — Telehealth: Payer: Self-pay

## 2022-07-25 NOTE — Telephone Encounter (Signed)
Pt called requesting an appt for "red spots" on her R side-primarily seen on arms and thighs. She states this has been happening periodically x 2 months or so. She saw her PCP who did not know what it was from. I have scheduled pt with APP next week and she is aware of this appt date/time.

## 2022-07-31 ENCOUNTER — Ambulatory Visit: Payer: Medicare Other

## 2022-07-31 NOTE — Progress Notes (Deleted)
Office Note     CC:  follow up Requesting Provider:  Raymon Mutton., FNP  HPI: Kiara Little is a 86 y.o. (04-Apr-1936) female who presents for evaluation of "red spots" on her right upper and lower extremity. She explains that these have been present for ***  She is s/p retrograde approach through the right anterior tibial artery and underwent right anterior tibial and superficial femoral artery angioplasty in April 2022 by Dr. Chestine Spore.  She was able to have complete healing of her right toe ulcer following this.  At her last visit with Dr. Arbie Cookey in July, she was without any claudication type symptoms.  She does remain active.  She does have some neuropathy symptoms in her feet that is worse at night.  The pt is on a statin for cholesterol management.  The pt is on a daily aspirin.   Other AC:  Plavix The pt is on CCB for hypertension.   The pt is not diabetic.  Tobacco hx: former, quit 2014   Past Medical History:  Diagnosis Date   Chronic back pain    COPD (chronic obstructive pulmonary disease) (HCC)    COVID-06 October 2019   Hyperlipidemia    Hypertension    Muscle spasm    Noncompliance    Osteoarthritis    a. s/p R THA   PAD (peripheral artery disease) (HCC)     Past Surgical History:  Procedure Laterality Date   ABDOMINAL AORTOGRAM W/LOWER EXTREMITY N/A 01/12/2021   Procedure: ABDOMINAL AORTOGRAM W/LOWER EXTREMITY;  Surgeon: Cephus Shelling, MD;  Location: [facility];  Service: Cardiovascular;  Laterality: N/A;   BACK SURGERY     Cataract surgeries Right 07/2019   JOINT REPLACEMENT  2000   RIGHT TOTAL HIP ARTHROPLASTY   PERIPHERAL VASCULAR BALLOON ANGIOPLASTY Right 01/12/2021   Procedure: PERIPHERAL VASCULAR BALLOON ANGIOPLASTY;  Surgeon: Cephus Shelling, MD;  Location: [facility];  Service: Cardiovascular;  Laterality: Right;  AT and SFA   SURGERY FOR TUBAL PREGNANCY     TOTAL HIP ARTHROPLASTY Left 07/24/2013   Procedure: LEFT TOTAL  HIP ARTHROPLASTY ANTERIOR APPROACH;  Surgeon: Kathryne Hitch, MD;  Location: [facility];  Service: Orthopedics;  Laterality: Left;    Social History   Socioeconomic History   Marital status: Legally Separated    Spouse name: Not on file   Number of children: Not on file   Years of education: Not on file   Highest education level: Not on file  Occupational History   Not on file  Tobacco Use   Smoking status: Former    Packs/day: 0.50    Years: 40.00    Total pack years: 20.00    Types: Cigarettes    Quit date: 07/24/2013    Years since quitting: 9.0    Passive exposure: Never   Smokeless tobacco: Never  Vaping Use   Vaping Use: Never used  Substance and Sexual Activity   Alcohol use: No   Drug use: No   Sexual activity: Not on file  Other Topics Concern   Not on file  Social History Narrative   Pt lives in [place] with her son.  She is retired.  She does not exercise.   Social Determinants of Health   Financial Resource Strain: Not on file  Food Insecurity: Not on file  Transportation Needs: Not on file  Physical Activity: Not on file  Stress: Not on file  Social Connections: Not on  file  Intimate Partner Violence: Not on file   *** Family History  Problem Relation Age of Onset   Heart attack Mother    Diabetes Father    Cancer Brother     Current Outpatient Medications  Medication Sig Dispense Refill   amLODipine (NORVASC) 10 MG tablet Take 10 mg by mouth every morning.     atorvastatin (LIPITOR) 10 MG tablet TAKE 1 TABLET BY MOUTH EVERY DAY (Patient not taking: Reported on 04/04/2022) 90 tablet 3   azelastine (ASTELIN) 0.1 % nasal spray Place 1 spray into both nostrils 2 (two) times daily. (Patient not taking: Reported on 04/04/2022)     clopidogrel (PLAVIX) 75 MG tablet TAKE 1 TABLET BY MOUTH EVERY DAY 90 tablet 3   D3-50 1.25 MG (50000 UT) capsule Take 50,000 Units by mouth daily.     HYDROcodone-acetaminophen (NORCO) 7.5-325 MG tablet Take 1 tablet  by mouth every 8 (eight) hours as needed.     methocarbamol (ROBAXIN) 500 MG tablet Take 1 tablet by mouth 2 (two) times daily. (Patient not taking: Reported on 04/04/2022)     pantoprazole (PROTONIX) 40 MG tablet      predniSONE (DELTASONE) 50 MG tablet Take one tablet daily for 5 days. (Patient not taking: Reported on 04/04/2022) 5 tablet 0   No current facility-administered medications for this visit.    Allergies  Allergen Reactions   Amphetamine Itching   Ibuprofen Hives and Rash    Pt states "It makes me break out on my backside"     REVIEW OF SYSTEMS:  *** [X]  denotes positive finding, [ ]  denotes negative finding Cardiac  Comments:  Chest pain or chest pressure:    Shortness of breath upon exertion:    Short of breath when lying flat:    Irregular heart rhythm:        Vascular    Pain in calf, thigh, or hip brought on by ambulation:    Pain in feet at night that wakes you up from your sleep:     Blood clot in your veins:    Leg swelling:         Pulmonary    Oxygen at home:    Productive cough:     Wheezing:         Neurologic    Sudden weakness in arms or legs:     Sudden numbness in arms or legs:     Sudden onset of difficulty speaking or slurred speech:    Temporary loss of vision in one eye:     Problems with dizziness:         Gastrointestinal    Blood in stool:     Vomited blood:         Genitourinary    Burning when urinating:     Blood in urine:        Psychiatric    Major depression:         Hematologic    Bleeding problems:    Problems with blood clotting too easily:        Skin    Rashes or ulcers:        Constitutional    Fever or chills:      PHYSICAL EXAMINATION:  There were no vitals filed for this visit.  General:  WDWN in NAD; vital signs documented above Gait: Not observed HENT: WNL, normocephalic Pulmonary: normal non-labored breathing , without Rales, rhonchi,  wheezing Cardiac: {Desc; regular/irreg:14544} HR, without   Murmurs {  With/Without:20273} carotid bruit*** Abdomen: soft, NT, no masses Skin: {With/Without:20273} rashes Vascular Exam/Pulses:  Right Left  Radial {Exam; arterial pulse strength 0-4:30167} {Exam; arterial pulse strength 0-4:30167}  Ulnar {Exam; arterial pulse strength 0-4:30167} {Exam; arterial pulse strength 0-4:30167}  Femoral {Exam; arterial pulse strength 0-4:30167} {Exam; arterial pulse strength 0-4:30167}  Popliteal {Exam; arterial pulse strength 0-4:30167} {Exam; arterial pulse strength 0-4:30167}  DP {Exam; arterial pulse strength 0-4:30167} {Exam; arterial pulse strength 0-4:30167}  PT {Exam; arterial pulse strength 0-4:30167} {Exam; arterial pulse strength 0-4:30167}   Extremities: {With/Without:20273} ischemic changes, {With/Without:20273} Gangrene , {With/Without:20273} cellulitis; {With/Without:20273} open wounds;  Musculoskeletal: no muscle wasting or atrophy  Neurologic: A&O X 3;  No focal weakness or paresthesias are detected Psychiatric:  The pt has {Desc; normal/abnormal:11317::"Normal"} affect.   Non-Invasive Vascular Imaging:   ***    ASSESSMENT/PLAN:: 86 y.o. female here for follow up for ***   -***   Graceann Congress, PA-C [facility] [phone]  Clinic MD:   Steve Rattler

## 2022-08-06 NOTE — Progress Notes (Unsigned)
HISTORY AND PHYSICAL     CC:  follow up. Requesting Provider:  Raymon Mutton., FNP  HPI: This is a 86 y.o. female who is here today for follow up for PAD.  Pt has hx of angiogram with right ATA angioplasty via retrograde right AT approach at the ankle and right SFA angioplasty via retrograde right AT approach at ankle by Dr. Chestine Spore 01/12/2021 for CLI with non healing right great toe wound.    Pt was last seen 04/04/2022 by Dr. Arbie Cookey and at that time, she had completely healed her right great toe ulcer and remained active.  She was not having any claudication sx and remained active.  She did have some burning and stinging in her feet bilaterally, which was worse at night.  She was scheduled for one year follow up.   The pt returns today for follow up.  She states that she gets occasional red bumps and she has indentions at her knees and she inquires if this is due to the stents or neuropathy.  She states that she does get a tightness in both her feet at night and it feels better when she gets up for a few minutes.  She does not have any new non healing wounds.  She does not have claudication.  She remains active.  She states that her feet have been about the same for 5 years.    The pt is on a statin for cholesterol management.    The pt is not on an aspirin.    Other AC:  none The pt is on CCB for hypertension.  The pt does not have diabetes. Tobacco hx:  former  Pt does not have family hx of AAA.  Past Medical History:  Diagnosis Date   Chronic back pain    COPD (chronic obstructive pulmonary disease) (HCC)    COVID-06 October 2019   Hyperlipidemia    Hypertension    Muscle spasm    Noncompliance    Osteoarthritis    a. s/p R THA   PAD (peripheral artery disease) (HCC)     Past Surgical History:  Procedure Laterality Date   ABDOMINAL AORTOGRAM W/LOWER EXTREMITY N/A 01/12/2021   Procedure: ABDOMINAL AORTOGRAM W/LOWER EXTREMITY;  Surgeon: Cephus Shelling, MD;  Location:  [facility];  Service: Cardiovascular;  Laterality: N/A;   BACK SURGERY     Cataract surgeries Right 07/2019   JOINT REPLACEMENT  2000   RIGHT TOTAL HIP ARTHROPLASTY   PERIPHERAL VASCULAR BALLOON ANGIOPLASTY Right 01/12/2021   Procedure: PERIPHERAL VASCULAR BALLOON ANGIOPLASTY;  Surgeon: Cephus Shelling, MD;  Location: [facility];  Service: Cardiovascular;  Laterality: Right;  AT and SFA   SURGERY FOR TUBAL PREGNANCY     TOTAL HIP ARTHROPLASTY Left 07/24/2013   Procedure: LEFT TOTAL HIP ARTHROPLASTY ANTERIOR APPROACH;  Surgeon: Kathryne Hitch, MD;  Location: [facility];  Service: Orthopedics;  Laterality: Left;    Allergies  Allergen Reactions   Amphetamine Itching   Ibuprofen Hives and Rash    Pt states "It makes me break out on my backside"    Current Outpatient Medications  Medication Sig Dispense Refill   amLODipine (NORVASC) 10 MG tablet Take 10 mg by mouth every morning.     atorvastatin (LIPITOR) 10 MG tablet TAKE 1 TABLET BY MOUTH EVERY DAY (Patient not taking: Reported on 04/04/2022) 90 tablet 3   azelastine (ASTELIN) 0.1 % nasal spray Place 1 spray into both nostrils  2 (two) times daily. (Patient not taking: Reported on 04/04/2022)     clopidogrel (PLAVIX) 75 MG tablet TAKE 1 TABLET BY MOUTH EVERY DAY 90 tablet 3   D3-50 1.25 MG (50000 UT) capsule Take 50,000 Units by mouth daily.     HYDROcodone-acetaminophen (NORCO) 7.5-325 MG tablet Take 1 tablet by mouth every 8 (eight) hours as needed.     methocarbamol (ROBAXIN) 500 MG tablet Take 1 tablet by mouth 2 (two) times daily. (Patient not taking: Reported on 04/04/2022)     pantoprazole (PROTONIX) 40 MG tablet      predniSONE (DELTASONE) 50 MG tablet Take one tablet daily for 5 days. (Patient not taking: Reported on 04/04/2022) 5 tablet 0   No current facility-administered medications for this visit.    Family History  Problem Relation Age of Onset   Heart attack Mother    Diabetes Father    Cancer  Brother     Social History   Socioeconomic History   Marital status: Legally Separated    Spouse name: Not on file   Number of children: Not on file   Years of education: Not on file   Highest education level: Not on file  Occupational History   Not on file  Tobacco Use   Smoking status: Former    Packs/day: 0.50    Years: 40.00    Total pack years: 20.00    Types: Cigarettes    Quit date: 07/24/2013    Years since quitting: 9.0    Passive exposure: Never   Smokeless tobacco: Never  Vaping Use   Vaping Use: Never used  Substance and Sexual Activity   Alcohol use: No   Drug use: No   Sexual activity: Not on file  Other Topics Concern   Not on file  Social History Narrative   Pt lives in [place] with her son.  She is retired.  She does not exercise.   Social Determinants of Health   Financial Resource Strain: Not on file  Food Insecurity: Not on file  Transportation Needs: Not on file  Physical Activity: Not on file  Stress: Not on file  Social Connections: Not on file  Intimate Partner Violence: Not on file     REVIEW OF SYSTEMS:   [X]  denotes positive finding, [ ]  denotes negative finding Cardiac  Comments:  Chest pain or chest pressure:    Shortness of breath upon exertion:    Short of breath when lying flat:    Irregular heart rhythm:        Vascular    Pain in calf, thigh, or hip brought on by ambulation:    Pain in feet at night that wakes you up from your sleep:     Blood clot in your veins:    Leg swelling:         Pulmonary    Oxygen at home:    Productive cough:     Wheezing:         Neurologic    Sudden weakness in arms or legs:     Sudden numbness in arms or legs:     Sudden onset of difficulty speaking or slurred speech:    Temporary loss of vision in one eye:     Problems with dizziness:         Gastrointestinal    Blood in stool:     Vomited blood:         Genitourinary    Burning when urinating:  Blood in urine:         Psychiatric    Major depression:         Hematologic    Bleeding problems:    Problems with blood clotting too easily:        Skin    Rashes or ulcers:        Constitutional    Fever or chills:      PHYSICAL EXAMINATION:  Today's Vitals   08/07/22 0841  BP: (!) 160/82  Pulse: 61  Resp: 20  Temp: 97.8 F (36.6 C)  TempSrc: Temporal  SpO2: 98%  Weight: 206 lb 14.4 oz (93.8 kg)  Height: 5\' 9"  (1.753 m)   Body mass index is 30.55 kg/m.   General:  WDWN in NAD; vital signs documented above Gait: Not observed HENT: WNL, normocephalic Pulmonary: normal non-labored breathing , without wheezing Cardiac: regular HR, without carotid bruits Abdomen: soft, NT; aortic pulse is  not palpable Skin: without rashes Vascular Exam/Pulses:  Right Left  Radial 2+ (normal) 2+ (normal)  Femoral monophasic 2+ (normal)  DP monophasic monophasic  PT absent absent  Peroneal monophasic monophasic   Extremities: without ischemic changes, without Gangrene , without cellulitis; without open wounds; motor in tact; decreased sensation Musculoskeletal: no muscle wasting or atrophy  Neurologic: A&O X 3 Psychiatric:  The pt has Normal affect.   Non-Invasive Vascular Imaging:   ABI's/TBI's on 04/04/2022: Right:  0.59/0.36 - Great toe pressure: 62 Left:  0.72/0.53 - Great toe pressure: 90     ASSESSMENT/PLAN:: 86 y.o. female here for follow up for PAD with hx of  angiogram with right ATA angioplasty via retrograde right AT approach at the ankle and right SFA angioplasty via retrograde right AT approach at ankle by Dr. 97 01/12/2021 for CLI with non healing right great toe wound.   -pt was seen by Dr. 01/14/2021 in July, she did not have a palpable popliteal on the right and he felt she may have narrowed her SFA angioplasty site.  But given she had healed her toe wound, he did not recommend any invasive evaluation or intervention unless she developed signs of CLI.   -she comes in today with  c/o tightness in both feet at night that improves with getting up and about.  She does not have any new non healing wounds and both feet are equal.  I feel that most of her symptoms are most likely neuropathic in nature, however, I am unable to feel her right femoral pulse.  She has monophasic doppler flow in bilateral DP and peroneal arteries.  She has motor in tact.  -given she does not have any non healing wounds, will have her return in 3 months and get a RLE arterial duplex and repeat ABI and see Dr. August at that time.  She knows to call sooner if there are any issues before then. -discussed with her that the occasional red bump or the indentions at her knee are not likely related to her angioplasty she had in April 2022.  -continue asa/statin    May 2022, [facility] [phone]  Clinic MD:   [phone]

## 2022-08-07 ENCOUNTER — Ambulatory Visit (INDEPENDENT_AMBULATORY_CARE_PROVIDER_SITE_OTHER): Payer: Medicare Other | Admitting: Physician Assistant

## 2022-08-07 ENCOUNTER — Other Ambulatory Visit: Payer: Self-pay

## 2022-08-07 VITALS — BP 160/82 | HR 61 | Temp 97.8°F | Resp 20 | Ht 69.0 in | Wt 206.9 lb

## 2022-08-07 DIAGNOSIS — I739 Peripheral vascular disease, unspecified: Secondary | ICD-10-CM | POA: Diagnosis not present

## 2022-08-07 DIAGNOSIS — M461 Sacroiliitis, not elsewhere classified: Secondary | ICD-10-CM | POA: Insufficient documentation

## 2022-11-13 ENCOUNTER — Ambulatory Visit (HOSPITAL_COMMUNITY): Payer: 59

## 2022-11-13 ENCOUNTER — Ambulatory Visit: Payer: 59 | Admitting: Vascular Surgery

## 2022-11-13 ENCOUNTER — Ambulatory Visit (HOSPITAL_COMMUNITY): Payer: 59 | Attending: Vascular Surgery

## 2022-12-30 ENCOUNTER — Other Ambulatory Visit: Payer: Self-pay

## 2022-12-30 ENCOUNTER — Emergency Department (HOSPITAL_COMMUNITY): Payer: 59

## 2022-12-30 ENCOUNTER — Emergency Department (HOSPITAL_COMMUNITY)
Admission: EM | Admit: 2022-12-30 | Discharge: 2022-12-30 | Disposition: A | Discharge status: Home / Self Care | Payer: 59 | Attending: Emergency Medicine | Admitting: Emergency Medicine

## 2022-12-30 DIAGNOSIS — I10 Essential (primary) hypertension: Secondary | ICD-10-CM | POA: Insufficient documentation

## 2022-12-30 DIAGNOSIS — J449 Chronic obstructive pulmonary disease, unspecified: Secondary | ICD-10-CM | POA: Insufficient documentation

## 2022-12-30 DIAGNOSIS — R1031 Right lower quadrant pain: Secondary | ICD-10-CM

## 2022-12-30 DIAGNOSIS — F17200 Nicotine dependence, unspecified, uncomplicated: Secondary | ICD-10-CM | POA: Insufficient documentation

## 2022-12-30 LAB — COMPREHENSIVE METABOLIC PANEL
ALT: 13 U/L (ref 0–44)
AST: 26 U/L (ref 15–41)
Albumin: 3.9 g/dL (ref 3.5–5.0)
Alkaline Phosphatase: 65 U/L (ref 38–126)
Anion gap: 11 (ref 5–15)
BUN: 12 mg/dL (ref 8–23)
CO2: 26 mmol/L (ref 22–32)
Calcium: 9.3 mg/dL (ref 8.9–10.3)
Chloride: 103 mmol/L (ref 98–111)
Creatinine, Ser: 0.85 mg/dL (ref 0.44–1.00)
GFR, Estimated: >60 mL/min (ref >60)
Glucose, Bld: 102 mg/dL — ABNORMAL HIGH (ref 70–99)
Potassium: 3.9 mmol/L (ref 3.5–5.1)
Sodium: 140 mmol/L (ref 135–145)
Total Bilirubin: 0.9 mg/dL (ref 0.3–1.2)
Total Protein: 7.6 g/dL (ref 6.5–8.1)

## 2022-12-30 LAB — URINALYSIS, ROUTINE W REFLEX MICROSCOPIC
Bilirubin Urine: NEGATIVE
Glucose, UA: NEGATIVE mg/dL
Hgb urine dipstick: NEGATIVE
Ketones, ur: NEGATIVE mg/dL
Nitrite: NEGATIVE
Protein, ur: NEGATIVE mg/dL
Specific Gravity, Urine: 1.013 (ref 1.005–1.030)
pH: 7 (ref 5.0–8.0)

## 2022-12-30 LAB — LIPASE, BLOOD: Lipase: 50 U/L (ref 11–51)

## 2022-12-30 LAB — LACTIC ACID, PLASMA: Lactic Acid, Venous: 0.8 mmol/L (ref 0.5–1.9)

## 2022-12-30 LAB — CBC
HCT: 39.5 % (ref 36.0–46.0)
Hemoglobin: 12.8 g/dL (ref 12.0–15.0)
MCH: 29.6 pg (ref 26.0–34.0)
MCHC: 32.4 g/dL (ref 30.0–36.0)
MCV: 91.2 fL (ref 80.0–100.0)
Platelets: 264 10*3/uL (ref 150–400)
RBC: 4.33 MIL/uL (ref 3.87–5.11)
RDW: 15.7 % — ABNORMAL HIGH (ref 11.5–15.5)
WBC: 7.5 10*3/uL (ref 4.0–10.5)
nRBC: 0 % (ref 0.0–0.2)

## 2022-12-30 MED ORDER — FENTANYL CITRATE PF 50 MCG/ML IJ SOSY
25.0000 ug | PREFILLED_SYRINGE | Freq: Once | INTRAMUSCULAR | Status: AC
  Administered 2022-12-30: 25 ug via INTRAVENOUS
  Filled 2022-12-30: qty 1

## 2022-12-30 MED ORDER — ONDANSETRON HCL 4 MG/2ML IJ SOLN
4.0000 mg | Freq: Four times a day (QID) | INTRAMUSCULAR | Status: DC | PRN
  Administered 2022-12-30: 4 mg via INTRAVENOUS
  Filled 2022-12-30: qty 2

## 2022-12-30 MED ORDER — FENTANYL CITRATE PF 50 MCG/ML IJ SOSY
50.0000 ug | PREFILLED_SYRINGE | INTRAMUSCULAR | Status: DC | PRN
  Administered 2022-12-30: 50 ug via INTRAVENOUS
  Filled 2022-12-30: qty 1

## 2022-12-30 MED ORDER — IOHEXOL 350 MG/ML SOLN
75.0000 mL | Freq: Once | INTRAVENOUS | Status: AC | PRN
  Administered 2022-12-30: 75 mL via INTRAVENOUS

## 2022-12-30 NOTE — ED Provider Notes (Signed)
[place] EMERGENCY DEPARTMENT AT [facility] Provider Note  Medical Decision Making   HPI: Kiara Little is a 87 y.o. female with history perinent for HTN, tobacco use, PAD, HLD, OA, COPD not on home oxygen, prior ectopic pregnancy per patient, prior back surgery per patient who presents complaining of abdominal pain.  History provided by patient.  Interpreter required for this encounter.  Patient reports that beginning this morning she developed severe pain, initiated in right lower quadrant.  Worsened by movement.  No particular alleviating factors.  Reports that she has had intermittent nausea that resolved spontaneously prior to arrival, denies any vomiting.  Reports that she had a firm bowel movement this morning, this is baseline for her.  Per triage note patient complaining of dysuria, patient denies this on my exam, denies dysuria, urgency, frequency, chest pain, shortness of breath, fevers, chills.  Pain is constant.  ROS: As per HPI. Please see MAR for complete past medical history, surgical history, and social history.   Physical exam is pertinent for significant tenderness to palpation in right lower quadrant with focal guarding.   The differential includes but is not limited to mesenteric ischemia, appendicitis, bowel obstruction, UTI, AAA, pyelonephritis, nephrolithiasis or pancreatitis.  Additional history obtained from: None External records from outside source obtained and reviewed including: None  ED provider interpretation of ECG: Not indicated  ED provider interpretation of radiology/imaging: ***  Labs ordered were interpreted by myself as well as my attending and were incorporated into the medical decision making process for this patient.  ED provider interpretation of labs: CBC without leukocytosis, anemia, thrombocytopenia.  Lipase WNL UA without UTI or RBCs.  CMP without AKI or emergent electrolyte derangement.  Lactic acid WNL  Interventions:  Fentanyl, Zofran  See the EMR for full details regarding lab and imaging results.  Patient overall vitally stable, however uncomfortable appearing on exam.  Reviewed labs obtained in triage, lack of leukocytosis is reassuring against infectious etiology, especially in absence of fever, however less sensitive in elderly population.  Doubt pancreatitis given lipase WNL.  Doubt cystitis, nephrolithiasis, pyelonephritis given no CVA tenderness, no suprapubic tenderness, UA without blood or evidence of UTI.  No evidence of AKI or LFT elevation on CMP.  Doubt cholecystitis given patient without focal right upper quadrant tenderness.  Given pain on exam, do feel that imaging is indicated, given age, history of PAD, will obtain CTA abdomen pelvis to evaluate for mesenteric ischemia as well as other intra-abdominal etiologies.  This revealed ***  Consults: ***  Disposition: {LSDISPO:29394}  The plan for this patient was discussed with Dr. Wilkie Aye, who voiced agreement and who oversaw evaluation and treatment of this patient.  Clinical Impression: No diagnosis found. Data Unavailable  Therapies: These medications and interventions were provided for the patient while in the ED. Medications - No data to display  MDM generated using voice dictation software and may contain dictation errors.  Please contact me for any clarification or with any questions.  Clinical Complexity A medically appropriate history, review of systems, and physical exam was performed.  Collateral history obtained from: None I personally reviewed the labs, EKG, imaging as discussed above. Patient's presentation is most consistent with acute complicated illness / injury requiring diagnostic workup ***Considered and ruled out life and body threatening conditions  Treatment: {LSTREATMENT:51051} Patient's {EMSDOH:[id number]} increases the complexity of managing their presentation. Medications: Parenterally controlled  substances Discussed patient's care with providers from the following different specialties: ***  Physical Exam   ED  Triage Vitals  Enc Vitals Group     BP 12/30/22 1427 (!) 145/91     Pulse Rate 12/30/22 1427 67     Resp 12/30/22 1427 18     Temp 12/30/22 1427 97.9 F (36.6 C)     Temp src --      SpO2 12/30/22 1427 94 %     Weight 12/30/22 1425 206 lb 12.7 oz (93.8 kg)     Height 12/30/22 1425  (1.753 m)     Head Circumference --      Peak Flow --      Pain Score 12/30/22 1425 10     Pain Loc --      Pain Edu? --      Excl. in GC? --      Physical Exam Vitals and nursing note reviewed.  Constitutional:      General: She is not in acute distress.    Appearance: She is well-developed.  HENT:     Head: Normocephalic and atraumatic.  Eyes:     Conjunctiva/sclera: Conjunctivae normal.  Cardiovascular:     Rate and Rhythm: Normal rate and regular rhythm.     Heart sounds: No murmur heard. Pulmonary:     Effort: Pulmonary effort is normal. No respiratory distress.     Breath sounds: Normal breath sounds.  Abdominal:     Palpations: Abdomen is soft.     Tenderness: There is abdominal tenderness in the right lower quadrant and suprapubic area. There is guarding (Focally in the right lower quadrant). There is no right CVA tenderness, left CVA tenderness or rebound. Positive signs include Rovsing's sign, McBurney's sign and psoas sign. Negative signs include Murphy's sign and obturator sign.  Musculoskeletal:        General: No swelling.     Cervical back: Neck supple.  Skin:    General: Skin is warm and dry.     Capillary Refill: Capillary refill takes less than 2 seconds.  Neurological:     Mental Status: She is alert and oriented to person, place, and time.  Psychiatric:        Mood and Affect: Mood normal.       Procedure Note  Procedures  No orders to display    Julianne Rice, MD Emergency Medicine, PGY-2

## 2022-12-30 NOTE — ED Triage Notes (Signed)
PT states RLQ pain and burning with urination since this am.  C/o some nausea.  States straining with bm today

## 2022-12-30 NOTE — ED Notes (Signed)
Assisted patient to bathroom via wheelchair. Provided pt with blanket and pillow. No other needs at this time. Daughter at bedside

## 2022-12-30 NOTE — Discharge Instructions (Addendum)
You came to the Emergency Department today because had some severe belly pain, overall your belly is feeling better here in the emergency department.  We did several labs, your kidney, pancreas, liver numbers look good.  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  Otherwise, you were CT does not demonstrate any specific cause of inflammation in your belly such as a kidney stone, inflammation of your appendix, bowel obstruction, etc.  Given you state that you have been having some hard stools that you strained to pass, it is possible that you have some significant constipation, and when your colon contracts around the hard stool this is what is causing your pain.  Can take a softener such as MiraLAX, this can be bought over-the-counter at places such as [facility] and [facility].  Please take 17 g of MiraLAX a day for at least 2 weeks to help soften your bowel movements, and to follow-up with your primary care doctor within the week to reevaluate your symptoms.  To-Do: 1. Please follow-up with your primary doctor within 1 week/ as soon as possible.    Please return to the Emergency Department or call 911 if you experience have worsening of your symptoms, or do not get better, chest pain, shortness of breath, severe or significantly worsening pain, high fever, severe confusion, pass out or have any reason to think that you need emergency medical care.   We hope you feel better soon.   Curley Spice, MD Department of Emergency Medicine [facility]

## 2022-12-30 NOTE — ED Notes (Signed)
Patient transported to CT 

## 2022-12-30 NOTE — ED Notes (Signed)
EDP at bedside  

## 2023-01-01 ENCOUNTER — Other Ambulatory Visit: Payer: Self-pay

## 2023-01-01 ENCOUNTER — Emergency Department (HOSPITAL_COMMUNITY)
Admission: EM | Admit: 2023-01-01 | Discharge: 2023-01-01 | Disposition: A | Discharge status: Home / Self Care | Payer: 59 | Attending: Emergency Medicine | Admitting: Emergency Medicine

## 2023-01-01 ENCOUNTER — Emergency Department (HOSPITAL_COMMUNITY): Payer: 59

## 2023-01-01 DIAGNOSIS — I10 Essential (primary) hypertension: Secondary | ICD-10-CM | POA: Insufficient documentation

## 2023-01-01 DIAGNOSIS — Z79899 Other long term (current) drug therapy: Secondary | ICD-10-CM | POA: Diagnosis not present

## 2023-01-01 DIAGNOSIS — Z7902 Long term (current) use of antithrombotics/antiplatelets: Secondary | ICD-10-CM | POA: Insufficient documentation

## 2023-01-01 DIAGNOSIS — R1013 Epigastric pain: Secondary | ICD-10-CM | POA: Diagnosis not present

## 2023-01-01 DIAGNOSIS — R1011 Right upper quadrant pain: Secondary | ICD-10-CM | POA: Diagnosis present

## 2023-01-01 DIAGNOSIS — G894 Chronic pain syndrome: Secondary | ICD-10-CM | POA: Insufficient documentation

## 2023-01-01 LAB — CBC WITH DIFFERENTIAL/PLATELET
Abs Immature Granulocytes: 0.03 10*3/uL (ref 0.00–0.07)
Basophils Absolute: 0 10*3/uL (ref 0.0–0.1)
Basophils Relative: 1 %
Eosinophils Absolute: 0.1 10*3/uL (ref 0.0–0.5)
Eosinophils Relative: 1 %
HCT: 39 % (ref 36.0–46.0)
Hemoglobin: 12.6 g/dL (ref 12.0–15.0)
Immature Granulocytes: 0 %
Lymphocytes Relative: 21 %
Lymphs Abs: 1.7 10*3/uL (ref 0.7–4.0)
MCH: 29.8 pg (ref 26.0–34.0)
MCHC: 32.3 g/dL (ref 30.0–36.0)
MCV: 92.2 fL (ref 80.0–100.0)
Monocytes Absolute: 0.4 10*3/uL (ref 0.1–1.0)
Monocytes Relative: 6 %
Neutro Abs: 5.8 10*3/uL (ref 1.7–7.7)
Neutrophils Relative %: 71 %
Platelets: 291 10*3/uL (ref 150–400)
RBC: 4.23 MIL/uL (ref 3.87–5.11)
RDW: 15.8 % — ABNORMAL HIGH (ref 11.5–15.5)
WBC: 8 10*3/uL (ref 4.0–10.5)
nRBC: 0 % (ref 0.0–0.2)

## 2023-01-01 LAB — URINALYSIS, ROUTINE W REFLEX MICROSCOPIC
Bilirubin Urine: NEGATIVE
Glucose, UA: NEGATIVE mg/dL
Hgb urine dipstick: NEGATIVE
Ketones, ur: 5 mg/dL — AB
Nitrite: NEGATIVE
Protein, ur: NEGATIVE mg/dL
Specific Gravity, Urine: 1.019 (ref 1.005–1.030)
pH: 5 (ref 5.0–8.0)

## 2023-01-01 LAB — COMPREHENSIVE METABOLIC PANEL
ALT: 11 U/L (ref 0–44)
AST: 25 U/L (ref 15–41)
Albumin: 3.7 g/dL (ref 3.5–5.0)
Alkaline Phosphatase: 59 U/L (ref 38–126)
Anion gap: 9 (ref 5–15)
BUN: 11 mg/dL (ref 8–23)
CO2: 26 mmol/L (ref 22–32)
Calcium: 9.2 mg/dL (ref 8.9–10.3)
Chloride: 104 mmol/L (ref 98–111)
Creatinine, Ser: 0.93 mg/dL (ref 0.44–1.00)
GFR, Estimated: >60 mL/min (ref >60)
Glucose, Bld: 101 mg/dL — ABNORMAL HIGH (ref 70–99)
Potassium: 4.2 mmol/L (ref 3.5–5.1)
Sodium: 139 mmol/L (ref 135–145)
Total Bilirubin: 0.8 mg/dL (ref 0.3–1.2)
Total Protein: 7.3 g/dL (ref 6.5–8.1)

## 2023-01-01 LAB — LIPASE, BLOOD: Lipase: 49 U/L (ref 11–51)

## 2023-01-01 MED ORDER — HYDROCODONE-ACETAMINOPHEN 5-325 MG PO TABS
1.0000 | ORAL_TABLET | Freq: Once | ORAL | Status: AC
  Administered 2023-01-01: 1 via ORAL
  Filled 2023-01-01: qty 1

## 2023-01-01 NOTE — ED Triage Notes (Signed)
Pt here with continued intermittent RLQ pain. Seen for same on Sunday and states she was told she was constipated. Had three good BMs yesterday and states continued pain. Also endorses intermittent L flank pain.

## 2023-01-01 NOTE — ED Provider Notes (Signed)
[place] EMERGENCY DEPARTMENT AT [facility] Provider Note   CSN: [id number] Arrival date & time: 01/01/23  1424     History {Add pertinent medical, surgical, social history, OB history to HPI:1} Chief Complaint  Patient presents with   Abdominal Pain    Kiara Little is a 87 y.o. female.  HPI     87 year old patient comes in with chief complaint of abdominal pain.  Patient has known history of PAD and thoracic artery aneurysm, chronic abdominal pain.  She also has hypertension, hyperlipidemia and denies any abdominal surgical history.  Patient was seen in the emergency room recently for abdominal pain.  Patient comes into the ER with the same abdominal pain.  It is located in the right upper quadrant.  Pain is fairly constant, with no specific evoking, aggravating or relieving factor.  Pain is worse with any kind of movement or activity.  Patient denies any musculoskeletal strain that she can recall.  Review of system is negative for nausea, vomiting, fevers, chills.  Home Medications Prior to Admission medications   Medication Sig Start Date End Date Taking? Authorizing Provider  amLODipine (NORVASC) 10 MG tablet Take 10 mg by mouth every morning.    [provider]  atorvastatin (LIPITOR) 10 MG tablet TAKE 1 TABLET BY MOUTH EVERY DAY 01/05/22   Maeola Harman, MD  azelastine (ASTELIN) 0.1 % nasal spray Place 1 spray into both nostrils 2 (two) times daily.    [provider]  clopidogrel (PLAVIX) 75 MG tablet TAKE 1 TABLET BY MOUTH EVERY DAY 11/07/21   Maeola Harman, MD  D3-50 1.25 MG (50000 UT) capsule Take 50,000 Units by mouth daily. 02/04/19   [provider]  HYDROcodone-acetaminophen (NORCO) 7.5-325 MG tablet Take 1 tablet by mouth every 8 (eight) hours as needed. 01/04/21   [provider]  methocarbamol (ROBAXIN) 500 MG tablet Take 1 tablet by mouth 2 (two) times daily. 02/11/21   [provider]  pantoprazole (PROTONIX) 40 MG tablet     [provider]  predniSONE (DELTASONE) 50 MG tablet Take one tablet daily for 5 days. 06/14/21   Kathryne Hitch, MD      Allergies    Amphetamine and Ibuprofen    Review of Systems   Review of Systems  All other systems reviewed and are negative.   Physical Exam Updated Vital Signs BP (!) 159/116   Pulse 68   Temp 97.9 F (36.6 C) (Oral)   Resp 18   Ht  (1.753 m)   Wt 95.3 kg   SpO2 100%   BMI 31.01 kg/m  Physical Exam Vitals and nursing note reviewed.  Constitutional:      Appearance: She is well-developed.  HENT:     Head: Normocephalic and atraumatic.  Eyes:     Extraocular Movements: Extraocular movements intact.  Cardiovascular:     Rate and Rhythm: Normal rate.  Pulmonary:     Effort: Pulmonary effort is normal.  Abdominal:     Tenderness: There is abdominal tenderness in the right upper quadrant and epigastric area. There is guarding. There is no rebound. Negative signs include Murphy's sign and McBurney's sign.  Musculoskeletal:     Cervical back: Normal range of motion and neck supple.  Skin:    General: Skin is dry.  Neurological:     Mental Status: She is alert and oriented to person, place, and time.     ED Results / Procedures / Treatments  Labs (all labs ordered are listed, but only abnormal results are displayed) Labs Reviewed  CBC WITH DIFFERENTIAL/PLATELET - Abnormal; Notable for the following components:      Result Value   RDW 15.8 (*)    All other components within normal limits  COMPREHENSIVE METABOLIC PANEL - Abnormal; Notable for the following components:   Glucose, Bld 101 (*)    All other components within normal limits  LIPASE, BLOOD  URINALYSIS, ROUTINE W REFLEX MICROSCOPIC    EKG None  Radiology No results found.  Procedures Procedures  {Document cardiac monitor, telemetry assessment procedure when appropriate:1}  Medications Ordered in  ED Medications  HYDROcodone-acetaminophen (NORCO/VICODIN) 5-325 MG per tablet 1 tablet (1 tablet Oral Given 01/01/23 1953)    ED Course/ Medical Decision Making/ A&P   {   Click here for ABCD2, HEART and other calculatorsREFRESH Note before signing :1}                          Medical Decision Making Amount and/or Complexity of Data Reviewed Radiology: ordered.  Risk Prescription drug management.   87 year old patient comes in with chief complaint of abdominal pain.  She has multiple medical comorbidities, including thoracic artery aneurysm, PAD and recent ER visit for the same abdominal pain.  Differential diagnosis considered for this patient includes peptic ulcer disease, cholelithiasis, musculoskeletal pain, cholestasis  I reviewed patient's prior CT scan.  CT scan at that time negative for perforation, appendicitis.  Patient's CT also negative for cholecystitis or gallstones.  Her common bile duct was dilated however.  I also reviewed patient's previous labs.  At this time, my suspicion is that the pain is not secondary to history of aneurysm.  I will repeat ultrasound this time, specifically looking at the common bile duct.  However, patient's LFTs, lipase are normal therefore I doubt that there is a common bile duct stone.  It also appears that patient has chronic pain syndrome.  She is on 7.5 mg of hydrocodone daily.  Will give her oral pain medicine here.  Final Clinical Impression(s) / ED Diagnoses Final diagnoses:  None    Rx / DC Orders ED Discharge Orders     None

## 2023-01-01 NOTE — ED Provider Triage Note (Signed)
Emergency Medicine Provider Triage Evaluation Note  Kiara Little , a 87 y.o. female  was evaluated in triage.  Pt complains of right lower quadrant sharp abdominal pain that started Sunday morning.  States that this has been intermittent.  States that she was evaluated for it 2 days ago but the pain has not gotten better.  CT from that visit showed no acute findings.  Lab work was unrevealing of cause of patient's pain.  She denies associated fever, chills, nausea, vomiting, diarrhea, chest pain, or shortness of breath.  She denies urinary symptoms.  She states her last bowel movement was yesterday was normal.  Review of Systems  Positive: See HPI Negative: See HPI  Physical Exam  BP (!) 143/81 (BP Location: Right Arm)   Pulse 66   Temp 98.4 F (36.9 C) (Oral)   Resp 16   SpO2 97%  Gen:   Awake, no distress   Resp:  Normal effort lungs clear to auscultation MSK:   Moves extremities without difficulty  Other:  Abdomen soft, moderate right lower quadrant abdominal tenderness, no rebound, guarding, or peritoneal signs, no CVA tenderness  Medical Decision Making  Medically screening exam initiated at 2:47 PM.  Appropriate orders placed.  Kiara Little was informed that the remainder of the evaluation will be completed by another provider, this initial triage assessment does not replace that evaluation, and the importance of remaining in the ED until their evaluation is complete.     Tonette Lederer, PA-C 01/01/23 1449

## 2023-01-01 NOTE — Discharge Instructions (Addendum)
We saw in the emergency room for abdominal pain.  The results in the ER continue to be reassuring.  It is possible that you might be having abdominal wall pain/muscle pain.  If that is the case then cold or warm compresses might help.  Follow-up with your primary care doctor in 1 week.

## 2023-03-18 ENCOUNTER — Ambulatory Visit: Payer: 59 | Attending: Cardiology | Admitting: Cardiology

## 2023-03-18 ENCOUNTER — Encounter: Payer: Self-pay | Admitting: Cardiology

## 2023-03-18 VITALS — BP 140/80 | HR 66 | Ht 69.0 in | Wt 195.4 lb

## 2023-03-18 DIAGNOSIS — E782 Mixed hyperlipidemia: Secondary | ICD-10-CM | POA: Diagnosis not present

## 2023-03-18 DIAGNOSIS — Z87898 Personal history of other specified conditions: Secondary | ICD-10-CM | POA: Diagnosis not present

## 2023-03-18 DIAGNOSIS — I739 Peripheral vascular disease, unspecified: Secondary | ICD-10-CM

## 2023-03-18 DIAGNOSIS — Z79899 Other long term (current) drug therapy: Secondary | ICD-10-CM

## 2023-03-18 DIAGNOSIS — I10 Essential (primary) hypertension: Secondary | ICD-10-CM

## 2023-03-18 MED ORDER — ATORVASTATIN CALCIUM 40 MG PO TABS: 40.0000 mg | ORAL_TABLET | Freq: Every day | ORAL | 3 refills | Status: DC

## 2023-03-18 NOTE — Progress Notes (Signed)
Cardiology Office Note  Date: 03/18/2023   ID: Kiara Little, DOB [birth year], MRN [id number]  PCP: Raymon Mutton., FNP  Chief Complaint:  Chief Complaint  Patient presents with   Cardiac follow-up   History of Present Illness: Kiara Little is an 87 y.o. female seen in consultation back in 2021.  She is referred back to the office by her PCP.  I reviewed interval records, she was hospitalized at [facility] in April for evaluation of chest discomfort.  She ruled out for ACS and underwent an echocardiogram, although no further ischemic testing (low risk Myoview in 2021).  Chest CTA indicated ectatic ascending thoracic aorta measuring 3.7 cm without aneurysm.  She does report intermittent left arm and chest pain, relates this to stress from recurring problems with lower back pain.  Not specifically exertional.  No significant change in chronic dyspnea on exertion.  She does not report any palpitations or syncope.  She does have a history of PAD status post angioplasty of the right anterior tibial and superficial femoral arteries in April 2022, followed by [facility].  I reviewed her medications.  She reports compliance with Lipitor 20 mg daily.  Her last LDL was 92 in April.  ECG today shows sinus rhythm with PAC, left bundle branch block which is old.  Review of Systems: As outlined in the history of present illness.  No claudication.  Past Medical History: Past Medical History:  Diagnosis Date   Chronic back pain    COPD (chronic obstructive pulmonary disease) (HCC)    COVID-06 October 2019   Hyperlipidemia    Hypertension    Muscle spasm    Noncompliance    Osteoarthritis    a. s/p R THA   PAD (peripheral artery disease) (HCC)    Past Surgical History: Past Surgical History:  Procedure Laterality Date   ABDOMINAL AORTOGRAM W/LOWER EXTREMITY N/A 01/12/2021   Procedure: ABDOMINAL AORTOGRAM W/LOWER EXTREMITY;  Surgeon: Cephus Shelling, MD;  Location: [facility] INVASIVE  CV LAB;  Service: Cardiovascular;  Laterality: N/A;   BACK SURGERY     Cataract surgeries Right 07/2019   JOINT REPLACEMENT  2000   RIGHT TOTAL HIP ARTHROPLASTY   PERIPHERAL VASCULAR BALLOON ANGIOPLASTY Right 01/12/2021   Procedure: PERIPHERAL VASCULAR BALLOON ANGIOPLASTY;  Surgeon: Cephus Shelling, MD;  Location: [facility] INVASIVE CV LAB;  Service: Cardiovascular;  Laterality: Right;  AT and SFA   SURGERY FOR TUBAL PREGNANCY     TOTAL HIP ARTHROPLASTY Left 07/24/2013   Procedure: LEFT TOTAL HIP ARTHROPLASTY ANTERIOR APPROACH;  Surgeon: Kathryne Hitch, MD;  Location: [facility] ORS;  Service: Orthopedics;  Laterality: Left;   Family History: Family History  Problem Relation Age of Onset   Heart attack Mother    Diabetes Father    Cancer Brother    Social History:  Social History   Tobacco Use   Smoking status: Former    Packs/day: 0.50    Years: 40.00    Additional pack years: 0.00    Total pack years: 20.00    Types: Cigarettes    Quit date: 07/24/2013    Years since quitting: 9.6    Passive exposure: Never   Smokeless tobacco: Never  Substance Use Topics   Alcohol use: No   Medications: Current Outpatient Medications on File Prior to Visit  Medication Sig Dispense Refill   amLODipine (NORVASC) 10 MG tablet Take 10 mg by mouth every morning.     atorvastatin (LIPITOR) 10  MG tablet TAKE 1 TABLET BY MOUTH EVERY DAY 90 tablet 3   clopidogrel (PLAVIX) 75 MG tablet TAKE 1 TABLET BY MOUTH EVERY DAY 90 tablet 3   HYDROcodone-acetaminophen (NORCO) 7.5-325 MG tablet Take 1 tablet by mouth every 8 (eight) hours as needed.     RESTASIS 0.05 % ophthalmic emulsion Place 1 drop into both eyes 2 (two) times daily.     No current facility-administered medications on file prior to visit.   Allergies: Allergies  Allergen Reactions   Amphetamine Itching   Ibuprofen Hives and Rash    Pt states "It makes me break out on my backside"   Physical Exam: VS:  BP (!) 140/80   Pulse 66   Ht  5\' 9"  (1.753 m)   Wt 195 lb 6.4 oz (88.6 kg)   SpO2 98%   BMI 28.86 kg/m , BMI Body mass index is 28.86 kg/m.  Wt Readings from Last 3 Encounters:  03/18/23 195 lb 6.4 oz (88.6 kg)  01/01/23 210 lb (95.3 kg)  12/30/22 206 lb 12.7 oz (93.8 kg)    General: Patient appears comfortable at rest. HEENT: Conjunctiva and lids normal. Neck: Supple, no elevated JVP or carotid bruits. Lungs: Clear to auscultation, nonlabored breathing at rest. Cardiac: Regular rate and rhythm, no S3, 1/6 systolic murmur. Abdomen: Soft, nontender, bowel sounds present. Extremities: No pitting edema, distal pulses 2+.  ECG:  An ECG dated 01/12/2021 was personally reviewed today and demonstrated:  Sinus bradycardia with PAC, left bundle branch block.  Labwork: 01/01/2023: ALT 11; AST 25; BUN 11; Creatinine, Ser 0.93; Hemoglobin 12.6; Platelets 291; Potassium 4.2; Sodium 139  April 2024: Cholesterol 141, triglycerides 74, HDL 33, LDL 92  Other Studies Reviewed Today:  Lexiscan Myoview 12/29/2019: Left bundle branch block present throughout the infusion with occasional PACs and PVCs. Medium sized, mild to moderate intensity, mid to apical anteroseptal and basal anterolateral defects that are fixed suggesting attenuation artifact in light of normal wall motion, although cannot definitively rule out scar given defect intensity. This is a low risk study. Nuclear stress EF: 52%.  Echocardiogram 01/06/2023 ([facility]): SUMMARY  The left ventricular size is normal with mild concentric hypertrophy.  LV ejection fraction = 55-60%.  Left ventricular systolic function is normal.  Left ventricular filling pattern is prolonged relaxation.  The left ventricular wall motion is normal.  The right ventricle is normal in size and function.  There is mild mitral regurgitation.  There is mild tricuspid regurgitation.  The aortic sinus is normal size.  IVC size was normal.  There is no pericardial effusion.  There is  no comparison study available.   Assessment and Plan:  1.  Chronic history of dyspnea on exertion, no progression.  Also has intermittent left arm and chest discomfort which is nonexertional.  She underwent reassuring ischemic testing in 2021.  Follow-up echocardiogram done through [facility] in April revealed LVEF 55 to 60% and no major valvular abnormalities.  ECG shows stable left bundle branch block.  Plan to continue medical therapy and risk reduction at this time.  She is on Plavix with concurrent history of PAD, also Norvasc and Lipitor.  2.  Mixed hyperlipidemia, increase Lipitor to 40 mg daily.  Her last LDL was 92 in April.  Recheck FLP for next visit.  3.  Essential hypertension on Norvasc.  No changes were made today.  Disposition:  Follow up  6 months.  Signed, Jonelle Sidle, M.D., F.A.C.C. [facility]

## 2023-03-18 NOTE — Patient Instructions (Addendum)
Medication Instructions:  Your physician has recommended you make the following change in your medication:  Increase atorvastatin to 40 mg daily Continue other medications as prescribed  Labwork: Your physician recommends that you return for a FASTING lipid profile: 6 months just before your next visit. Please do not eat or drink for at least 8 hours when you have this done. You may take your medications that morning with a sip of water. [facility] ([address] )  Testing/Procedures: none  Follow-Up: Your physician recommends that you schedule a follow-up appointment in: 6 months  Any Other Special Instructions Will Be Listed Below (If Applicable).  If you need a refill on your cardiac medications before your next appointment, please call your pharmacy.

## 2023-04-23 ENCOUNTER — Other Ambulatory Visit: Payer: Self-pay | Admitting: Family

## 2023-04-23 DIAGNOSIS — Z1231 Encounter for screening mammogram for malignant neoplasm of breast: Secondary | ICD-10-CM

## 2023-04-29 ENCOUNTER — Emergency Department (HOSPITAL_COMMUNITY)
Admission: EM | Admit: 2023-04-29 | Discharge: 2023-04-29 | Disposition: A | Discharge status: Home / Self Care | Payer: 59 | Source: Home / Self Care | Attending: Emergency Medicine | Admitting: Emergency Medicine

## 2023-04-29 ENCOUNTER — Other Ambulatory Visit: Payer: Self-pay

## 2023-04-29 ENCOUNTER — Emergency Department (HOSPITAL_COMMUNITY): Payer: 59

## 2023-04-29 ENCOUNTER — Encounter (HOSPITAL_COMMUNITY): Payer: Self-pay

## 2023-04-29 DIAGNOSIS — M1712 Unilateral primary osteoarthritis, left knee: Secondary | ICD-10-CM | POA: Diagnosis not present

## 2023-04-29 DIAGNOSIS — M25562 Pain in left knee: Secondary | ICD-10-CM | POA: Diagnosis not present

## 2023-04-29 DIAGNOSIS — Z7901 Long term (current) use of anticoagulants: Secondary | ICD-10-CM | POA: Diagnosis not present

## 2023-04-29 DIAGNOSIS — R072 Precordial pain: Secondary | ICD-10-CM | POA: Insufficient documentation

## 2023-04-29 DIAGNOSIS — R079 Chest pain, unspecified: Secondary | ICD-10-CM | POA: Diagnosis present

## 2023-04-29 LAB — BASIC METABOLIC PANEL
Anion gap: 10 (ref 5–15)
BUN: 17 mg/dL (ref 8–23)
CO2: 26 mmol/L (ref 22–32)
Calcium: 8.8 mg/dL — ABNORMAL LOW (ref 8.9–10.3)
Chloride: 104 mmol/L (ref 98–111)
Creatinine, Ser: 1.04 mg/dL — ABNORMAL HIGH (ref 0.44–1.00)
GFR, Estimated: 53 mL/min — ABNORMAL LOW (ref >60)
Glucose, Bld: 92 mg/dL (ref 70–99)
Potassium: 3.6 mmol/L (ref 3.5–5.1)
Sodium: 140 mmol/L (ref 135–145)

## 2023-04-29 LAB — CBC
HCT: 38.3 % (ref 36.0–46.0)
Hemoglobin: 12 g/dL (ref 12.0–15.0)
MCH: 29.2 pg (ref 26.0–34.0)
MCHC: 31.3 g/dL (ref 30.0–36.0)
MCV: 93.2 fL (ref 80.0–100.0)
Platelets: 242 10*3/uL (ref 150–400)
RBC: 4.11 MIL/uL (ref 3.87–5.11)
RDW: 16 % — ABNORMAL HIGH (ref 11.5–15.5)
WBC: 7.8 10*3/uL (ref 4.0–10.5)
nRBC: 0 % (ref 0.0–0.2)

## 2023-04-29 LAB — TROPONIN I (HIGH SENSITIVITY)
Troponin I (High Sensitivity): 34 ng/L — ABNORMAL HIGH (ref <18)
Troponin I (High Sensitivity): 32 ng/L — ABNORMAL HIGH (ref <18)
Troponin I (High Sensitivity): 36 ng/L — ABNORMAL HIGH (ref <18)

## 2023-04-29 MED ORDER — ALUM & MAG HYDROXIDE-SIMETH 200-200-20 MG/5ML PO SUSP
30.0000 mL | Freq: Once | ORAL | Status: AC
  Administered 2023-04-29: 30 mL via ORAL
  Filled 2023-04-29: qty 30

## 2023-04-29 MED ORDER — FAMOTIDINE 20 MG PO TABS
20.0000 mg | ORAL_TABLET | Freq: Once | ORAL | Status: AC
  Administered 2023-04-29: 20 mg via ORAL
  Filled 2023-04-29: qty 1

## 2023-04-29 MED ORDER — ACETAMINOPHEN 500 MG PO TABS
1000.0000 mg | ORAL_TABLET | Freq: Once | ORAL | Status: AC
  Administered 2023-04-29: 1000 mg via ORAL
  Filled 2023-04-29: qty 2

## 2023-04-29 NOTE — ED Notes (Signed)
Pt ambulated to the restroom with no assistance.

## 2023-04-29 NOTE — Discharge Instructions (Addendum)
It was our pleasure to provide your ER care today - we hope that you feel better.  Take acetaminophen or ibuprofen as need.   For chest discomfort, follow up closely with cardiologist in the next 1-2 weeks - we made a referral for you, and office should contact you with an appointment in the next few days.   For knee pain, follow up with primary care doctor or orthopedist in the next couple weeks.   Return to ER if worse, new symptoms, fevers, recurrent/persistent chest pain, increased trouble breathing, or other concern.

## 2023-04-29 NOTE — ED Provider Notes (Signed)
[place] EMERGENCY DEPARTMENT AT [facility] Provider Note   CSN: [id number] Arrival date & time: 04/29/23  1735     History  Chief Complaint  Patient presents with   Chest Pain    Kiara Little is a 87 y.o. female.  Pt c/o chest pain in the past three days. Symptoms at rest, intermittent, at rest, lasting seconds per episode, non radiating, not worse w exertion/activity. No fever or cough. No associated diaphoresis. No nv. No sob or new/unusual doe. No chest wall injury or strain. No heartburn. No abd pain or nv. No extremity swelling. No hx cad. Family hx cad in parent.   The history is provided by the patient, medical records and the EMS personnel.  Chest Pain Associated symptoms: no abdominal pain, no back pain, no cough, no fever, no headache, no palpitations, no shortness of breath and no vomiting        Home Medications Prior to Admission medications   Medication Sig Start Date End Date Taking? Authorizing Provider  amLODipine (NORVASC) 10 MG tablet Take 10 mg by mouth every morning.    [provider]  atorvastatin (LIPITOR) 40 MG tablet Take 1 tablet (40 mg total) by mouth daily. 03/18/23   Jonelle Sidle, MD  clopidogrel (PLAVIX) 75 MG tablet TAKE 1 TABLET BY MOUTH EVERY DAY 11/07/21   Maeola Harman, MD  HYDROcodone-acetaminophen [facility]) 7.5-325 MG tablet Take 1 tablet by mouth every 8 (eight) hours as needed. 01/04/21   [provider]  RESTASIS 0.05 % ophthalmic emulsion Place 1 drop into both eyes 2 (two) times daily.    [provider]      Allergies    Patient has no known allergies.    Review of Systems   Review of Systems  Constitutional:  Negative for chills and fever.  HENT:  Negative for sore throat.   Eyes:  Negative for redness.  Respiratory:  Negative for cough and shortness of breath.   Cardiovascular:  Positive for chest pain. Negative for palpitations and leg swelling.  Gastrointestinal:   Negative for abdominal pain and vomiting.  Genitourinary:  Negative for flank pain.  Musculoskeletal:  Negative for back pain and neck pain.  Skin:  Negative for rash.  Neurological:  Negative for headaches.  Hematological:  Does not bruise/bleed easily.  Psychiatric/Behavioral:  Negative for confusion.     Physical Exam Updated Vital Signs BP 134/78 (BP Location: Left Arm)   Pulse (!) 52   Temp 98.7 F (37.1 C) (Oral)   Resp 19   Ht 1.753 m (5\' 9" )   Wt 66.7 kg   SpO2 93%   BMI 21.71 kg/m  Physical Exam Vitals and nursing note reviewed.  Constitutional:      Appearance: Normal appearance. She is well-developed.  HENT:     Head: Atraumatic.     Nose: Nose normal.     Mouth/Throat:     Mouth: Mucous membranes are moist.  Eyes:     General: No scleral icterus.    Conjunctiva/sclera: Conjunctivae normal.  Neck:     Trachea: No tracheal deviation.  Cardiovascular:     Rate and Rhythm: Normal rate and regular rhythm.     Pulses: Normal pulses.     Heart sounds: Normal heart sounds. No murmur heard.    No friction rub. No gallop.  Pulmonary:     Effort: Pulmonary effort is normal. No respiratory distress.     Breath sounds: Normal breath sounds.  Chest:     Chest wall: No tenderness.  Abdominal:     General: There is no distension.     Palpations: Abdomen is soft.     Tenderness: There is no abdominal tenderness.  Musculoskeletal:        General: No swelling or tenderness.     Cervical back: Normal range of motion and neck supple. No rigidity or tenderness. No muscular tenderness.     Right lower leg: No edema.     Left lower leg: No edema.     Comments: Left knee stable. No effusion. LLE of normal color and warmth. No LLE swelling. Distal pulses palp. No pain w passive rom at left hip, knee or ankle.   Skin:    General: Skin is warm and dry.     Findings: No rash.  Neurological:     Mental Status: She is alert.     Comments: Alert, speech normal.   Psychiatric:         Mood and Affect: Mood normal.     ED Results / Procedures / Treatments   Labs (all labs ordered are listed, but only abnormal results are displayed) Results for orders placed or performed during the hospital encounter of 04/29/23  Basic metabolic panel  Result Value Ref Range   Sodium 140 135 - 145 mmol/L   Potassium 3.6 3.5 - 5.1 mmol/L   Chloride 104 98 - 111 mmol/L   CO2 26 22 - 32 mmol/L   Glucose, Bld 92 70 - 99 mg/dL   BUN 17 8 - 23 mg/dL   Creatinine, Ser 6.96 (H) 0.44 - 1.00 mg/dL   Calcium 8.8 (L) 8.9 - 10.3 mg/dL   GFR, Estimated 53 (L) >60 mL/min   Anion gap 10 5 - 15  CBC  Result Value Ref Range   WBC 7.8 4.0 - 10.5 K/uL   RBC 4.11 3.87 - 5.11 MIL/uL   Hemoglobin 12.0 12.0 - 15.0 g/dL   HCT 29.5 28.4 - 13.2 %   MCV 93.2 80.0 - 100.0 fL   MCH 29.2 26.0 - 34.0 pg   MCHC 31.3 30.0 - 36.0 g/dL   RDW 44.0 (H) 10.2 - 72.5 %   Platelets 242 150 - 400 K/uL   nRBC 0.0 0.0 - 0.2 %  Troponin I (High Sensitivity)  Result Value Ref Range   Troponin I (High Sensitivity) 34 (H) <18 ng/L  Troponin I (High Sensitivity)  Result Value Ref Range   Troponin I (High Sensitivity) 32 (H) <18 ng/L  Troponin I (High Sensitivity)  Result Value Ref Range   Troponin I (High Sensitivity) 36 (H) <18 ng/L   DG Knee Complete 4 Views Left  Result Date: 04/29/2023 CLINICAL DATA:  Pain left knee EXAM: LEFT KNEE - COMPLETE 4+ VIEW COMPARISON:  None Available. FINDINGS: No recent fracture or dislocation is seen. Degenerative changes are noted with bony spurs in medial, lateral and patellofemoral compartments. Possible small effusion is seen in suprapatellar bursa. There is subcutaneous edema in the medial and lateral aspects. IMPRESSION: No recent fracture or dislocation is seen. Moderate to marked degenerative changes are noted. Possible small effusion. Electronically Signed   By: Ernie Avena M.D.   On: 04/29/2023 18:54   DG Chest 2 View  Result Date: 04/29/2023 CLINICAL DATA:   Chest pain EXAM: CHEST - 2 VIEW COMPARISON:  X-ray 09/18/2019 FINDINGS: Enlarged cardiopericardial silhouette with a calcified and tortuous aorta. No pneumothorax or effusion. There is some linear opacity at the  lung bases likely scar or atelectasis. No consolidation. Diffuse degenerative changes of the spine. IMPRESSION: Enlarged heart.  Basilar atelectasis. Electronically Signed   By: Karen Kays M.D.   On: 04/29/2023 18:16     EKG EKG Interpretation Date/Time:  Monday April 29 2023 17:48:36 EDT Ventricular Rate:  63 PR Interval:  172 QRS Duration:  158 QT Interval:  468 QTC Calculation: 478 R Axis:   -57  Text Interpretation: Normal sinus rhythm with sinus arrhythmia Left axis deviation Left bundle branch block Confirmed by Cathren Laine ([id number]) on 04/29/2023 5:58:38 PM  Radiology DG Knee Complete 4 Views Left  Result Date: 04/29/2023 CLINICAL DATA:  Pain left knee EXAM: LEFT KNEE - COMPLETE 4+ VIEW COMPARISON:  None Available. FINDINGS: No recent fracture or dislocation is seen. Degenerative changes are noted with bony spurs in medial, lateral and patellofemoral compartments. Possible small effusion is seen in suprapatellar bursa. There is subcutaneous edema in the medial and lateral aspects. IMPRESSION: No recent fracture or dislocation is seen. Moderate to marked degenerative changes are noted. Possible small effusion. Electronically Signed   By: Ernie Avena M.D.   On: 04/29/2023 18:54   DG Chest 2 View  Result Date: 04/29/2023 CLINICAL DATA:  Chest pain EXAM: CHEST - 2 VIEW COMPARISON:  X-ray 09/18/2019 FINDINGS: Enlarged cardiopericardial silhouette with a calcified and tortuous aorta. No pneumothorax or effusion. There is some linear opacity at the lung bases likely scar or atelectasis. No consolidation. Diffuse degenerative changes of the spine. IMPRESSION: Enlarged heart.  Basilar atelectasis. Electronically Signed   By: Karen Kays M.D.   On: 04/29/2023 18:16     Procedures Procedures    Medications Ordered in ED Medications  acetaminophen (TYLENOL) tablet 1,000 mg (1,000 mg Oral Given 04/29/23 2058)  famotidine (PEPCID) tablet 20 mg (20 mg Oral Given 04/29/23 2058)  alum & mag hydroxide-simeth (MAALOX/MYLANTA) 200-200-20 MG/5ML suspension 30 mL (30 mLs Oral Given 04/29/23 2059)    ED Course/ Medical Decision Making/ A&P                                 Medical Decision Making Problems Addressed: Acute pain of left knee: acute illness or injury Precordial chest pain: acute illness or injury with systemic symptoms that poses a threat to life or bodily functions Primary osteoarthritis of left knee: chronic illness or injury  Amount and/or Complexity of Data Reviewed Independent Historian:     Details: Fam, hx External Data Reviewed: notes. Labs: ordered. Decision-making details documented in ED Course. Radiology: ordered and independent interpretation performed. Decision-making details documented in ED Course. ECG/medicine tests: ordered and independent interpretation performed. Decision-making details documented in ED Course.  Risk OTC drugs. Decision regarding hospitalization.   Iv ns. Continuous pulse ox and cardiac monitoring. Labs ordered/sent. Imaging ordered.   Differential diagnosis includes ACS, msk cp, gi cp, etc. Dispo decision including potential need for admission considered - will get labs and imaging and reassess.   Reviewed nursing notes and prior charts for additional history. External reports reviewed. Additional history from: family.   Pt adds also wants check as left knee soreness/pain anteriorly in past week. Denies injury. Denies hx arthritis/djd. No leg or knee swelling. No erythema or skin changes. No lower leg or calf pain or swelling. No hip pain.   Cardiac monitor: sinus rhythm, rate 66.  Labs reviewed/interpreted by me - wbc and hgb normal. Chem normal. Trop sl elev 34. Delta  trop pending. No current chest  pain.   Additional labs reviewed/interpreted by me - pts most recent prior trops similarly mildly high. Pts current/recent chest pain v atypical/brief. Delta trops normal and not increasing. No current cp or increased wob.   Xrays reviewed/interpreted by me - no pna.   Pt currently appears stable for d/c.   Rec close pcp and cardiology f/u.  Return precautions provided.          Final Clinical Impression(s) / ED Diagnoses Final diagnoses:  None    Rx / DC Orders ED Discharge Orders     None         Cathren Laine, MD 04/29/23 2224

## 2023-04-29 NOTE — ED Triage Notes (Signed)
Pt reports left side chest pain intermittently since Saturday.

## 2023-05-09 ENCOUNTER — Ambulatory Visit
Admission: RE | Admit: 2023-05-09 | Discharge: 2023-05-09 | Disposition: A | Discharge status: Home / Self Care | Payer: 59 | Source: Ambulatory Visit | Attending: Family | Admitting: Family

## 2023-05-09 DIAGNOSIS — Z1231 Encounter for screening mammogram for malignant neoplasm of breast: Secondary | ICD-10-CM

## 2023-05-15 ENCOUNTER — Ambulatory Visit (INDEPENDENT_AMBULATORY_CARE_PROVIDER_SITE_OTHER): Payer: 59 | Admitting: Podiatry

## 2023-05-15 DIAGNOSIS — M19071 Primary osteoarthritis, right ankle and foot: Secondary | ICD-10-CM | POA: Diagnosis not present

## 2023-05-15 DIAGNOSIS — M2031 Hallux varus (acquired), right foot: Secondary | ICD-10-CM

## 2023-05-15 DIAGNOSIS — I999 Unspecified disorder of circulatory system: Secondary | ICD-10-CM

## 2023-05-15 NOTE — Patient Instructions (Signed)
The shoes are causing rubbing to the toe, she is to change out the shoes.

## 2023-05-15 NOTE — Progress Notes (Signed)
Subjective:  Patient ID: Kiara Little, female    DOB: Dec 10, 1935,  MRN: [id number]  Chief Complaint  Patient presents with   Toe Pain    87 y.o. female presents with the above complaint.  Patient presents with right hallux IPJ contracture with underlying arthritis.  Patient states that the hallux malleus contracture is causing her some pain and pushing up against the shoe causing her to have a hyperkeratotic lesion painful to touch she would like to discuss surgical options conservative for now.  She also has a history of vascular intervention.   Review of Systems: Negative except as noted in the HPI. Denies N/V/F/Ch.  Past Medical History:  Diagnosis Date   Chronic back pain    COPD (chronic obstructive pulmonary disease) (HCC)    COVID-06 October 2019   Hyperlipidemia    Hypertension    Muscle spasm    Noncompliance    Osteoarthritis    a. s/p R THA   PAD (peripheral artery disease) (HCC)     Current Outpatient Medications:    amLODipine (NORVASC) 10 MG tablet, Take 10 mg by mouth every morning., Disp: , Rfl:    atorvastatin (LIPITOR) 40 MG tablet, Take 1 tablet (40 mg total) by mouth daily., Disp: 90 tablet, Rfl: 3   clopidogrel (PLAVIX) 75 MG tablet, TAKE 1 TABLET BY MOUTH EVERY DAY, Disp: 90 tablet, Rfl: 3   HYDROcodone-acetaminophen (NORCO) 7.5-325 MG tablet, Take 1 tablet by mouth every 8 (eight) hours as needed., Disp: , Rfl:    RESTASIS 0.05 % ophthalmic emulsion, Place 1 drop into both eyes 2 (two) times daily., Disp: , Rfl:   Social History   Tobacco Use  Smoking Status Former   Current packs/day: 0.00   Average packs/day: 0.5 packs/day for 40.0 years (20.0 ttl pk-yrs)   Types: Cigarettes   Start date: 07/24/1973   Quit date: 07/24/2013   Years since quitting: 9.8   Passive exposure: Never  Smokeless Tobacco Never    No Known Allergies Objective:  There were no vitals filed for this visit. There is no height or weight on file to calculate  BMI. Constitutional Well developed. Well nourished.  Vascular Dorsalis pedis pulses nonpalpable bilaterally. Posterior tibial pulses non palpable bilaterally. Capillary refill diminished to all digits.  No cyanosis or clubbing noted. Pedal hair not present  Neurologic Normal speech. Oriented to person, place, and time. Epicritic sensation to light touch grossly present bilaterally.  Dermatologic Nails well groomed and normal in appearance. No open wounds. No skin lesions.  Orthopedic: Pain on palpation right hallux IPJ pain with range of motion limited range of motion noted of the hallux IPJ.  Hyperkeratotic lesion without open wound noted to the dorsal medial surface of the IPJ of the hallux.  No purulent drainage noted no open wounds noted.  Hallux malleus contracture noted to semirigid   Radiographs: None Assessment:   1. Vascular abnormality   2. Hallux malleus of right foot   3. Degenerative arthritis of toe joint, right    Plan:  Patient was evaluated and treated and all questions answered.  Right hallux malleus with underlying interphalangeal joint arthritis -All questions and concerns were discussed with the patient in extensive detail.  Toe protectors were dispensed.  Also discussed with her surgical options including IPJ fusion of the right hallux.  She states understanding and will think about it.  She also has a vascular history.  She is not a diabetic.  Vascular abnormality -Patient will  benefit from another ABIs PVRs of the previous 1 was over 2 years ago.  Patient has a history of angioplasty to the right leg.

## 2023-05-16 ENCOUNTER — Other Ambulatory Visit: Payer: Self-pay

## 2023-05-16 DIAGNOSIS — I739 Peripheral vascular disease, unspecified: Secondary | ICD-10-CM

## 2023-05-21 ENCOUNTER — Ambulatory Visit (INDEPENDENT_AMBULATORY_CARE_PROVIDER_SITE_OTHER)
Admission: RE | Admit: 2023-05-21 | Discharge: 2023-05-21 | Disposition: A | Discharge status: Home / Self Care | Payer: 59 | Source: Ambulatory Visit | Attending: Vascular Surgery | Admitting: Vascular Surgery

## 2023-05-21 ENCOUNTER — Encounter: Payer: Self-pay | Admitting: Vascular Surgery

## 2023-05-21 ENCOUNTER — Ambulatory Visit (INDEPENDENT_AMBULATORY_CARE_PROVIDER_SITE_OTHER): Payer: 59 | Admitting: Vascular Surgery

## 2023-05-21 ENCOUNTER — Ambulatory Visit (HOSPITAL_COMMUNITY)
Admission: RE | Admit: 2023-05-21 | Discharge: 2023-05-21 | Disposition: A | Discharge status: Home / Self Care | Payer: 59 | Source: Ambulatory Visit | Attending: Vascular Surgery | Admitting: Vascular Surgery

## 2023-05-21 VITALS — BP 145/87 | HR 56 | Temp 98.7°F | Resp 18 | Ht 69.0 in | Wt 193.7 lb

## 2023-05-21 DIAGNOSIS — I70222 Atherosclerosis of native arteries of extremities with rest pain, left leg: Secondary | ICD-10-CM | POA: Insufficient documentation

## 2023-05-21 DIAGNOSIS — I739 Peripheral vascular disease, unspecified: Secondary | ICD-10-CM | POA: Insufficient documentation

## 2023-05-21 DIAGNOSIS — I70221 Atherosclerosis of native arteries of extremities with rest pain, right leg: Secondary | ICD-10-CM | POA: Insufficient documentation

## 2023-05-21 LAB — VAS US ABI WITH/WO TBI
Left ABI: 0.86
Right ABI: 0.87

## 2023-05-21 NOTE — Progress Notes (Signed)
Patient name: Kiara Little MRN: [id number] DOB: 03-05-1936 Sex: female  REASON FOR CONSULT: New right great toe ulcer  HPI: Kiara Little is a 87 y.o. female, with multiple medical problems including COPD, hypertension, hyperlipidemia, PAD that presents for evaluation of a right great toe ulcer.  Patient states she rubbed an ulcer on her right great toe about 2 weeks ago with a new pair of shoes.  Patient previously underwent right AT angioplasty as well as right SFA angioplasty via retrograde AT approach on 01/12/2021 for a wound that ultimately healed.  Last seen on 08/07/2022 by her PA with no lower extremity symptoms.  Past Medical History:  Diagnosis Date   Chronic back pain    COPD (chronic obstructive pulmonary disease) (HCC)    COVID-06 October 2019   Hyperlipidemia    Hypertension    Muscle spasm    Noncompliance    Osteoarthritis    a. s/p R THA   PAD (peripheral artery disease) (HCC)     Past Surgical History:  Procedure Laterality Date   ABDOMINAL AORTOGRAM W/LOWER EXTREMITY N/A 01/12/2021   Procedure: ABDOMINAL AORTOGRAM W/LOWER EXTREMITY;  Surgeon: Cephus Shelling, MD;  Location: [facility] INVASIVE [facility];  Service: Cardiovascular;  Laterality: N/A;   BACK SURGERY     Cataract surgeries Right 07/2019   JOINT REPLACEMENT  2000   RIGHT TOTAL HIP ARTHROPLASTY   PERIPHERAL VASCULAR BALLOON ANGIOPLASTY Right 01/12/2021   Procedure: PERIPHERAL VASCULAR BALLOON ANGIOPLASTY;  Surgeon: Cephus Shelling, MD;  Location: [facility] INVASIVE [facility];  Service: Cardiovascular;  Laterality: Right;  AT and SFA   SURGERY FOR TUBAL PREGNANCY     TOTAL HIP ARTHROPLASTY Left 07/24/2013   Procedure: LEFT TOTAL HIP ARTHROPLASTY ANTERIOR APPROACH;  Surgeon: Kathryne Hitch, MD;  Location: [facility];  Service: Orthopedics;  Laterality: Left;    Family History  Problem Relation Age of Onset   Heart attack Mother    Diabetes Father    Cancer Brother     SOCIAL HISTORY: Social  History   Socioeconomic History   Marital status: Legally Separated    Spouse name: Not on file   Number of children: Not on file   Years of education: Not on file   Highest education level: Not on file  Occupational History   Not on file  Tobacco Use   Smoking status: Former    Current packs/day: 0.00    Average packs/day: 0.5 packs/day for 40.0 years (20.0 ttl pk-yrs)    Types: Cigarettes    Start date: 07/24/1973    Quit date: 07/24/2013    Years since quitting: 9.8    Passive exposure: Never   Smokeless tobacco: Never  Vaping Use   Vaping status: Never Used  Substance and Sexual Activity   Alcohol use: No   Drug use: No   Sexual activity: Not on file  Other Topics Concern   Not on file  Social History Narrative   Pt lives in [place] with her son.  She is retired.  She does not exercise.   Social Determinants of Health   Financial Resource Strain: Not on file  Food Insecurity: Low Risk  (01/04/2023)   [facility]   Hunger Vital Sign    Worried About Running Out of Food in the Last Year: Never true    Ran Out of Food in the Last Year: Never true  Transportation Needs: No Transportation Needs (01/04/2023)  [facility]   Transportation    In the past 12 months, has lack of reliable transportation kept you from medical appointments, meetings, work or from getting things needed for daily living? : No  Physical Activity: Not on file  Stress: Not on file  Social Connections: Not on file  Intimate Partner Violence: Not on file    No Known Allergies  Current Outpatient Medications  Medication Sig Dispense Refill   amLODipine (NORVASC) 10 MG tablet Take 10 mg by mouth every morning.     atorvastatin (LIPITOR) 40 MG tablet Take 1 tablet (40 mg total) by mouth daily. 90 tablet 3   clopidogrel (PLAVIX) 75 MG tablet TAKE 1 TABLET BY MOUTH EVERY DAY 90 tablet 3   HYDROcodone-acetaminophen (NORCO) 7.5-325 MG  tablet Take 1 tablet by mouth every 8 (eight) hours as needed.     RESTASIS 0.05 % ophthalmic emulsion Place 1 drop into both eyes 2 (two) times daily.     No current facility-administered medications for this visit.    REVIEW OF SYSTEMS:  [X]  denotes positive finding, [ ]  denotes negative finding Cardiac  Comments:  Chest pain or chest pressure:    Shortness of breath upon exertion:    Short of breath when lying flat:    Irregular heart rhythm:        Vascular    Pain in calf, thigh, or hip brought on by ambulation:    Pain in feet at night that wakes you up from your sleep:     Blood clot in your veins:    Leg swelling:         Pulmonary    Oxygen at home:    Productive cough:     Wheezing:         Neurologic    Sudden weakness in arms or legs:     Sudden numbness in arms or legs:     Sudden onset of difficulty speaking or slurred speech:    Temporary loss of vision in one eye:     Problems with dizziness:         Gastrointestinal    Blood in stool:     Vomited blood:         Genitourinary    Burning when urinating:     Blood in urine:        Psychiatric    Major depression:         Hematologic    Bleeding problems:    Problems with blood clotting too easily:        Skin    Rashes or ulcers:        Constitutional    Fever or chills:      PHYSICAL EXAM: There were no vitals filed for this visit.  GENERAL: The patient is a well-nourished female, in no acute distress. The vital signs are documented above. CARDIAC: There is a regular rate and rhythm.  VASCULAR:  Bilateral femoral pulses palpable No palpable pedal pulses Right great toe ulcer as pictured PULMONARY: No respiratory distress. ABDOMEN: Soft and non-tender. MUSCULOSKELETAL: There are no major deformities or cyanosis. NEUROLOGIC: No focal weakness or paresthesias are detected. PSYCHIATRIC: The patient has a normal affect.    DATA:   ABIs today are 0.87 on the right monophasic and 0.86  on the left multiphasic  Right leg arterial duplex shows high-grade stenosis in the proximal popliteal artery of a velocity of 342 as well as occluded anterior tibial  and posterior tibial arteries  Assessment/Plan:  87 y.o. female, with multiple medical problems including COPD, hypertension, hyperlipidemia, PAD that presents for evaluation of a right great toe ulcer that has been present about 2 weeks.  Discussed that ABIs today suggest she has inadequate toe pressure for wound healing in the right foot with a toe pressure of 40s.  Right leg arterial duplex shows high-grade stenosis in the above-knee popliteal artery with significant tibial disease with occluded anterior tibial and posterior tibial artery.  I have recommended aortogram, lower extreme arteriogram with a focus on the right leg with possible intervention.  Suspect this will be popliteal or tibial disease.  Risks benefits discussed.  Will get scheduled in the cath lab next week.   Cephus Shelling, MD [facility] Office: [phone]

## 2023-05-23 ENCOUNTER — Other Ambulatory Visit: Payer: Self-pay

## 2023-05-23 DIAGNOSIS — I70235 Atherosclerosis of native arteries of right leg with ulceration of other part of foot: Secondary | ICD-10-CM

## 2023-05-27 ENCOUNTER — Telehealth: Payer: Self-pay

## 2023-05-27 NOTE — Telephone Encounter (Signed)
Attempted to reach patient to update arrival time for procedure on 9/12. Left VM for patient to return call.

## 2023-05-30 ENCOUNTER — Ambulatory Visit (HOSPITAL_COMMUNITY)
Admission: RE | Admit: 2023-05-30 | Discharge: 2023-05-30 | Disposition: A | Discharge status: Home / Self Care | Payer: 59 | Source: Ambulatory Visit | Attending: Vascular Surgery | Admitting: Vascular Surgery

## 2023-05-30 ENCOUNTER — Other Ambulatory Visit: Payer: Self-pay

## 2023-05-30 ENCOUNTER — Encounter (HOSPITAL_COMMUNITY)
Admission: RE | Disposition: A | Discharge status: Home / Self Care | Payer: Self-pay | Source: Ambulatory Visit | Attending: Vascular Surgery

## 2023-05-30 DIAGNOSIS — I70235 Atherosclerosis of native arteries of right leg with ulceration of other part of foot: Secondary | ICD-10-CM | POA: Insufficient documentation

## 2023-05-30 DIAGNOSIS — Z87891 Personal history of nicotine dependence: Secondary | ICD-10-CM | POA: Insufficient documentation

## 2023-05-30 DIAGNOSIS — Z538 Procedure and treatment not carried out for other reasons: Secondary | ICD-10-CM | POA: Diagnosis not present

## 2023-05-30 DIAGNOSIS — J449 Chronic obstructive pulmonary disease, unspecified: Secondary | ICD-10-CM | POA: Diagnosis not present

## 2023-05-30 DIAGNOSIS — Z8249 Family history of ischemic heart disease and other diseases of the circulatory system: Secondary | ICD-10-CM | POA: Diagnosis not present

## 2023-05-30 DIAGNOSIS — Z8616 Personal history of COVID-19: Secondary | ICD-10-CM | POA: Insufficient documentation

## 2023-05-30 DIAGNOSIS — L97519 Non-pressure chronic ulcer of other part of right foot with unspecified severity: Secondary | ICD-10-CM | POA: Diagnosis not present

## 2023-05-30 DIAGNOSIS — E785 Hyperlipidemia, unspecified: Secondary | ICD-10-CM | POA: Insufficient documentation

## 2023-05-30 DIAGNOSIS — I10 Essential (primary) hypertension: Secondary | ICD-10-CM | POA: Insufficient documentation

## 2023-05-30 LAB — POCT I-STAT, CHEM 8
BUN: 12 mg/dL (ref 8–23)
Calcium, Ion: 1.17 mmol/L (ref 1.15–1.40)
Chloride: 105 mmol/L (ref 98–111)
Creatinine, Ser: 0.8 mg/dL (ref 0.44–1.00)
Glucose, Bld: 96 mg/dL (ref 70–99)
HCT: 38 % (ref 36.0–46.0)
Hemoglobin: 12.9 g/dL (ref 12.0–15.0)
Potassium: 3.6 mmol/L (ref 3.5–5.1)
Sodium: 145 mmol/L (ref 135–145)
TCO2: 28 mmol/L (ref 22–32)

## 2023-05-30 SURGERY — INVASIVE LAB ABORTED CASE

## 2023-05-30 MED ORDER — SODIUM CHLORIDE 0.9 % IV SOLN: INTRAVENOUS | Status: DC

## 2023-05-30 MED ORDER — LIDOCAINE HCL (PF) 1 % IJ SOLN
INTRAMUSCULAR | Status: AC
  Filled 2023-05-30: qty 30

## 2023-05-30 SURGICAL SUPPLY — 7 items
CATH OMNI FLUSH 5F 65CM (CATHETERS) ×1 IMPLANT
COVER DOME SNAP 22 D (MISCELLANEOUS) ×1 IMPLANT
KIT MICROPUNCTURE NIT STIFF (SHEATH) ×1 IMPLANT
SHEATH PINNACLE 5F 10CM (SHEATH) ×1 IMPLANT
SHEATH PROBE COVER 6X72 (BAG) ×1 IMPLANT
TRAY PV CATH (CUSTOM PROCEDURE TRAY) ×2 IMPLANT
WIRE BENTSON .035X145CM (WIRE) ×1 IMPLANT

## 2023-05-30 NOTE — Progress Notes (Signed)
87 year old female scheduled for right leg angiogram for tissue loss today.  After putting the patient on the table she was very tearful and felt she could not lay flat.  Chronic back pain and does not feel she can lay flat for the procedure.  She is scheduled for a back injection next week.  Ultimately after discussing options of moving forward we have elected to cancel the procedure and opted to reschedule in about 3 weeks.  Toe looks stable as pictured.  No signs of infection.    Cephus Shelling, MD [facility] Office: [phone]   Cephus Shelling

## 2023-05-30 NOTE — H&P (Signed)
History and Physical Interval Note:  05/30/2023 8:23 AM  [name]  has presented today for surgery, with the diagnosis of ischemia of right lower extremity with toe ulcer.  The various methods of treatment have been discussed with the patient and family. After consideration of risks, benefits and other options for treatment, the patient has consented to  Procedure(s): ABDOMINAL AORTOGRAM W/LOWER EXTREMITY (N/A) as a surgical intervention.  The patient's history has been reviewed, patient examined, no change in status, stable for surgery.  I have reviewed the patient's chart and labs.  Questions were answered to the patient's satisfaction.     Cephus Shelling     Patient name: Kiara Little            MRN: [id number]        DOB: 10/08/1935            Sex: female   REASON FOR CONSULT: New right great toe ulcer   HPI: [name] is a 87 y.o. female, with multiple medical problems including COPD, hypertension, hyperlipidemia, PAD that presents for evaluation of a right great toe ulcer.  Patient states she rubbed an ulcer on her right great toe about 2 weeks ago with a new pair of shoes.  Patient previously underwent right AT angioplasty as well as right SFA angioplasty via retrograde AT approach on 01/12/2021 for a wound that ultimately healed.  Last seen on 08/07/2022 by her PA with no lower extremity symptoms.       Past Medical History:  Diagnosis Date   Chronic back pain     COPD (chronic obstructive pulmonary disease) (HCC)     COVID-06 October 2019   Hyperlipidemia     Hypertension     Muscle spasm     Noncompliance     Osteoarthritis      a. s/p R THA   PAD (peripheral artery disease) (HCC)                 Past Surgical History:  Procedure Laterality Date   ABDOMINAL AORTOGRAM W/LOWER EXTREMITY N/A 01/12/2021    Procedure: ABDOMINAL AORTOGRAM W/LOWER EXTREMITY;  Surgeon: Cephus Shelling, MD;  Location: [facility];  Service: Cardiovascular;   Laterality: N/A;   BACK SURGERY       Cataract surgeries Right 07/2019   JOINT REPLACEMENT   2000    RIGHT TOTAL HIP ARTHROPLASTY   PERIPHERAL VASCULAR BALLOON ANGIOPLASTY Right 01/12/2021    Procedure: PERIPHERAL VASCULAR BALLOON ANGIOPLASTY;  Surgeon: Cephus Shelling, MD;  Location: [facility];  Service: Cardiovascular;  Laterality: Right;  AT and SFA   SURGERY FOR TUBAL PREGNANCY       TOTAL HIP ARTHROPLASTY Left 07/24/2013    Procedure: LEFT TOTAL HIP ARTHROPLASTY ANTERIOR APPROACH;  Surgeon: Kathryne Hitch, MD;  Location: [facility];  Service: Orthopedics;  Laterality: Left;               Family History  Problem Relation Age of Onset   Heart attack Mother     Diabetes Father     Cancer Brother            SOCIAL HISTORY: Social History         Socioeconomic History   Marital status: Legally Separated      Spouse name: Not on file   Number of children: Not on file   Years of education: Not on file   Highest  education level: Not on file  Occupational History   Not on file  Tobacco Use   Smoking status: Former      Current packs/day: 0.00      Average packs/day: 0.5 packs/day for 40.0 years (20.0 ttl pk-yrs)      Types: Cigarettes      Start date: 07/24/1973      Quit date: 07/24/2013      Years since quitting: 9.8      Passive exposure: Never   Smokeless tobacco: Never  Vaping Use   Vaping status: Never Used  Substance and Sexual Activity   Alcohol use: No   Drug use: No   Sexual activity: Not on file  Other Topics Concern   Not on file  Social History Narrative    Pt lives in [place] with her son.  She is retired.  She does not exercise.    Social Determinants of Health        Financial Resource Strain: Not on file  Food Insecurity: Low Risk  (01/04/2023)    [facility]    Hunger Vital Sign     Worried About Running Out of Food in the Last Year: Never true     Ran Out of Food in the Last Year: Never true   Transportation Needs: No Transportation Needs (01/04/2023)    [facility]    Transportation     In the past 12 months, has lack of reliable transportation kept you from medical appointments, meetings, work or from getting things needed for daily living? : No  Physical Activity: Not on file  Stress: Not on file  Social Connections: Not on file  Intimate Partner Violence: Not on file      Allergies  No Known Allergies           Current Outpatient Medications  Medication Sig Dispense Refill   amLODipine (NORVASC) 10 MG tablet Take 10 mg by mouth every morning.       atorvastatin (LIPITOR) 40 MG tablet Take 1 tablet (40 mg total) by mouth daily. 90 tablet 3   clopidogrel (PLAVIX) 75 MG tablet TAKE 1 TABLET BY MOUTH EVERY DAY 90 tablet 3   HYDROcodone-acetaminophen (NORCO) 7.5-325 MG tablet Take 1 tablet by mouth every 8 (eight) hours as needed.       RESTASIS 0.05 % ophthalmic emulsion Place 1 drop into both eyes 2 (two) times daily.          No current facility-administered medications for this visit.        REVIEW OF SYSTEMS:  [X]  denotes positive finding, [ ]  denotes negative finding Cardiac   Comments:  Chest pain or chest pressure:      Shortness of breath upon exertion:      Short of breath when lying flat:      Irregular heart rhythm:             Vascular      Pain in calf, thigh, or hip brought on by ambulation:      Pain in feet at night that wakes you up from your sleep:       Blood clot in your veins:      Leg swelling:              Pulmonary      Oxygen at home:      Productive cough:       Wheezing:  Neurologic      Sudden weakness in arms or legs:       Sudden numbness in arms or legs:       Sudden onset of difficulty speaking or slurred speech:      Temporary loss of vision in one eye:       Problems with dizziness:              Gastrointestinal      Blood in stool:       Vomited blood:               Genitourinary      Burning when urinating:       Blood in urine:             Psychiatric      Major depression:              Hematologic      Bleeding problems:      Problems with blood clotting too easily:             Skin      Rashes or ulcers:             Constitutional      Fever or chills:          PHYSICAL EXAM: There were no vitals filed for this visit.   GENERAL: The patient is a well-nourished female, in no acute distress. The vital signs are documented above. CARDIAC: There is a regular rate and rhythm.  VASCULAR:  Bilateral femoral pulses palpable No palpable pedal pulses Right great toe ulcer as pictured PULMONARY: No respiratory distress. ABDOMEN: Soft and non-tender. MUSCULOSKELETAL: There are no major deformities or cyanosis. NEUROLOGIC: No focal weakness or paresthesias are detected. PSYCHIATRIC: The patient has a normal affect.      DATA:    ABIs today are 0.87 on the right monophasic and 0.86 on the left multiphasic   Right leg arterial duplex shows high-grade stenosis in the proximal popliteal artery of a velocity of 342 as well as occluded anterior tibial and posterior tibial arteries   Assessment/Plan:   87 y.o. female, with multiple medical problems including COPD, hypertension, hyperlipidemia, PAD that presents for evaluation of a right great toe ulcer that has been present about 2 weeks.  Discussed that ABIs today suggest she has inadequate toe pressure for wound healing in the right foot with a toe pressure of 40s.  Right leg arterial duplex shows high-grade stenosis in the above-knee popliteal artery with significant tibial disease with occluded anterior tibial and posterior tibial artery.  I have recommended aortogram, lower extreme arteriogram with a focus on the right leg with possible intervention.  Suspect this will be popliteal or tibial disease.  Risks benefits discussed.  Will get scheduled in the cath lab next week.     Cephus Shelling, MD [facility] Office: [phone]

## 2023-06-10 ENCOUNTER — Telehealth: Payer: Self-pay

## 2023-06-10 NOTE — Telephone Encounter (Signed)
Left VM for patient to return the call, in attempt to reschedule aortogram.

## 2023-06-26 NOTE — Telephone Encounter (Signed)
Contacted patient to follow up if she has received back injection due to her back pain and inability to lay flat, so she can proceed with rescheduling her aortogram. Patient stated, she is still waiting to hear from the doctor's office, but will contact them again today and then call our office back when scheduled.

## 2023-08-01 ENCOUNTER — Telehealth: Payer: Self-pay | Admitting: Cardiology

## 2023-08-01 NOTE — Telephone Encounter (Signed)
   Pre-operative Risk Assessment    Patient Name: Kiara Little  DOB: 1936-05-11 MRN: [id number]      Request for Surgical Clearance    Procedure:   left l3 l4  ESI  Date of Surgery:  Clearance TBD                                 Surgeon:  NAa Surgeon's Group or Practice Name:  [facility]  Phone number:  [phone] Fax number:  [phone]    Type of Clearance Requested:   - Pharmacy:  Hold Clopidogrel (Plavix) 7?    Type of Anesthesia:  Not Indicated   Additional requests/questions:    Lajuana Matte   08/01/2023, 5:18 PM

## 2023-08-02 NOTE — Telephone Encounter (Signed)
   Patient Name: Kiara Little  DOB: 1936-03-31 MRN: [id number]  Primary Cardiologist: Nona Dell, MD  Chart reviewed as part of pre-operative protocol coverage.  Patient takes Plavix for history of PAD with prior interventions, managed per vascular surgery.  Therefore, recommendations for holding Plavix prior to spinal injection should come from managing provider (vascular surgery).  I will route this recommendation to the requesting party via Epic fax function and remove from pre-op pool.  Please call with questions.  Joylene Grapes, NP 08/02/2023, 7:52 AM

## 2023-08-22 NOTE — Progress Notes (Signed)
Fax received from [facility] on 08/05/23 for medical clearance/medication hold for left L3-L4 BSI to be signed by C. Chestine Spore, MD.  Provider signed on 08/20/23, scanned into pt's chart, media routed via MyChart to sender on 08/22/23.

## 2023-09-19 ENCOUNTER — Encounter: Payer: Self-pay | Admitting: Nurse Practitioner

## 2023-09-19 ENCOUNTER — Ambulatory Visit: Payer: 59 | Attending: Nurse Practitioner | Admitting: Nurse Practitioner

## 2023-09-20 ENCOUNTER — Encounter: Payer: Self-pay | Admitting: Nurse Practitioner

## 2023-10-07 ENCOUNTER — Ambulatory Visit: Payer: 59 | Attending: Nurse Practitioner | Admitting: Nurse Practitioner

## 2023-10-07 ENCOUNTER — Encounter: Payer: Self-pay | Admitting: Nurse Practitioner

## 2023-10-07 VITALS — BP 130/70 | HR 67 | Ht 69.0 in | Wt 178.0 lb

## 2023-10-07 DIAGNOSIS — E782 Mixed hyperlipidemia: Secondary | ICD-10-CM

## 2023-10-07 DIAGNOSIS — R0989 Other specified symptoms and signs involving the circulatory and respiratory systems: Secondary | ICD-10-CM

## 2023-10-07 DIAGNOSIS — I10 Essential (primary) hypertension: Secondary | ICD-10-CM | POA: Diagnosis not present

## 2023-10-07 DIAGNOSIS — Z01810 Encounter for preprocedural cardiovascular examination: Secondary | ICD-10-CM | POA: Diagnosis not present

## 2023-10-07 DIAGNOSIS — I739 Peripheral vascular disease, unspecified: Secondary | ICD-10-CM

## 2023-10-07 DIAGNOSIS — R002 Palpitations: Secondary | ICD-10-CM | POA: Diagnosis not present

## 2023-10-07 DIAGNOSIS — G629 Polyneuropathy, unspecified: Secondary | ICD-10-CM

## 2023-10-07 DIAGNOSIS — J449 Chronic obstructive pulmonary disease, unspecified: Secondary | ICD-10-CM

## 2023-10-07 NOTE — Progress Notes (Unsigned)
Cardiology Office Note:  .   Date: 10/07/2023 ID:  MCKINNEY GLASS, DOB [birth year], MRN [id number] PCP: Raymon Mutton., FNP  [facility] Providers Cardiologist:  Nona Dell, MD    History of Present Illness: .   Kiara Little is a 88 y.o. female with a PMH of chest pain, mixed hyperlipidemia, hypertension, chronic dyspnea on exertion, COPD, PAD, s/p angioplasty of right anterior tibial and superficial femoral arteries in 2022 (followed by [facility]), who presents today for 56-month follow-up appointment.  NST in 2021 was unremarkable.  Echocardiogram with [facility] in April 2024 showed normal EF, no major valvular abnormalities.  Last seen by Dr. Diona Browner on March 18, 2023.  Reported intermittent left arm/chest pain, was related to stress and was having recurring lower back pain.  In the interim, she presented to Dr. Chestine Spore with [facility] with right great toe ulcer.  ABIs that day suggested an adequate toe pressure from wound healing and right foot with toe pressure of 40s.  Right leg arterial duplex revealed high-grade stenosis in above-knee popliteal artery with significant tibial disease with occluded anterior tibial and posterior tibial artery.  Aortogram was recommended along with lower extremity arteriogram with focus look on the right leg with possible intervention.  This procedure was rescheduled due to her chronic back pain, was going to receive injection to help with this.  Patient states she will call back office when ready to schedule procedure.  Today she presents for 15-month follow-up appointment.  She tells me she is unsure of her blood pressure trends at home, says she does own a arm cuff.  She admits to "fluttering" sensation along her left breast tissue, says she experiences DOE when this occurs.  Says it is rare in occurrence and last for "split-second" when it happens.  Denies any chest pain, syncope, presyncope, dizziness, orthopnea, PND, swelling or significant weight  changes, acute bleeding, or claudication.  She does admit to some issues with neuropathy, says gabapentin did not help in the past.  Wants to know what else can be taken for her neuropathy. Tells me she is scheduled for upcoming spinal injection in future.   ROS: Negative.  See HPI.  SH: Works as a Engineer, structural 5 days/week.  Studies Reviewed: Marland Kitchen    Vascular ultrasound lower extremity arterial duplex right 05/2023: Summary:  Right: Total occlusion noted in the anterior tibial artery. Total  occlusion noted in the posterior tibial artery. Right above knee popliteal  artery 50-74% stenosis based on PSV, or 75-99% stenosis based on PSV ratio  >4.0.  ABIs 05/2023: Arterial wall calcification precludes accurate ankle pressures and ABIs.  Bilateral ABIs appear increased. Bilateral TBIs appear essentially  unchanged.    Summary:  Right: Resting right ankle-brachial index indicates mild right lower  extremity arterial disease. The right toe-brachial index is abnormal.   Left: Resting left ankle-brachial index indicates mild left lower  extremity arterial disease. The left toe-brachial index is abnormal.    *See table(s) above for measurements and observations.    Lexiscan 12/2019: Left bundle branch block present throughout the infusion with occasional PACs and PVCs. Medium sized, mild to moderate intensity, mid to apical anteroseptal and basal anterolateral defects that are fixed suggesting attenuation artifact in light of normal wall motion, although cannot definitively rule out scar given defect intensity. This is a low risk study. Nuclear stress EF: 52%.  Echocardiogram 12/2019: 1. Left ventricular ejection fraction, by estimation, is 55%. The left  ventricle has normal function.  The left ventricle has no regional wall  motion abnormalities. There is moderate left ventricular hypertrophy. Left ventricular diastolic parameters are consistent with Grade I diastolic dysfunction (impaired  relaxation).   2. Right ventricular systolic function is normal. The right ventricular  size is normal. Tricuspid regurgitation signal is inadequate for assessing  PA pressure.   3. Left atrial size was mildly dilated.   4. The mitral valve is grossly normal. Mild mitral valve regurgitation.   5. The aortic valve is tricuspid. Aortic valve regurgitation is not  visualized.   6. The inferior vena cava is normal in size with greater than 50%  respiratory variability, suggesting right atrial pressure of 3 mmHg.       Physical Exam:   VS:  BP 130/70 (BP Location: Left Arm, Patient Position: Sitting, Cuff Size: Normal)   Pulse 67   Ht 5\' 9"  (1.753 m)   Wt 178 lb (80.7 kg)   SpO2 98%   BMI 26.29 kg/m    Wt Readings from Last 3 Encounters:  10/07/23 178 lb (80.7 kg)  05/30/23 210 lb (95.3 kg)  05/21/23 193 lb 11.2 oz (87.9 kg)    GEN: Well nourished, well developed in no acute distress NECK: No JVD; right carotid bruit, no left carotid bruit CARDIAC: S1/S2, RRR, no murmurs, rubs, gallops RESPIRATORY:  Clear to auscultation without rales, wheezing or rhonchi  ABDOMEN: Soft, non-tender, non-distended EXTREMITIES:  No edema; No deformity   ASSESSMENT AND PLAN: .    HTN Blood pressure on recheck at goal.  She is unsure of her BP trends at home. Discussed to monitor BP at home at least 2 hours after medications and sitting for 5-10 minutes.  No medication changes at this time. Heart healthy diet and regular cardiovascular exercise encouraged.   2. Palpitations Rare and brief in occurrence.  No indication for heart monitor at this time. No medication changes at this time. Will continue to observe for now. Care and ED precautions discussed. Heart healthy diet and regular cardiovascular exercise encouraged.   3. Mixed HLD No recent labs on file.  Will request most recent labs from PCP.  Continue atorvastatin. Heart healthy diet and regular cardiovascular exercise encouraged.   4. PAD,  right carotid bruit Closely followed by [facility].  See vascular studies noted above.  She has a right carotid bruit noted on exam with no recent carotid duplex.  Will arrange this at this time.  Tells me she is scheduled for upcoming back injection in the future-see below.  No medication changes at this time.  Continue follow-up with [facility].  5. COPD Denies any recent or acute exacerbations recent shortness of breath. Continue current medication regimen. Continue to follow with PCP.  6. Pre-operative cardiovascular risk assessment Chart reviewed as part of pre-operative protocol coverage.  Patient takes Plavix for history of PAD with prior interventions, managed by [facility].  Therefore, recommendations for holding Plavix prior to spinal injection should come from [facility].  7. Neuropathy Recommended to discuss with PCP who is managing this and to talk with her doctor regarding Lyrica. She verbalized understanding.   Dispo: Follow-up with Dr. Diona Browner or APP in 4-6 months or sooner if anything changes.   Signed, Sharlene Dory, NP

## 2023-10-07 NOTE — Patient Instructions (Addendum)
Medication Instructions:  Your physician recommends that you continue on your current medications as directed. Please refer to the Current Medication list given to you today.  Labwork: None   Testing/Procedures: Your physician has requested that you have a carotid duplex. This test is an ultrasound of the carotid arteries in your neck. It looks at blood flow through these arteries that supply the brain with blood. Allow one hour for this exam. There are no restrictions or special instructions.  Follow-Up: Your physician recommends that you schedule a follow-up appointment in: 4-6 months   Any Other Special Instructions Will Be Listed Below (If Applicable).  If you need a refill on your cardiac medications before your next appointment, please call your pharmacy.

## 2023-10-15 ENCOUNTER — Telehealth: Payer: Self-pay | Admitting: Nurse Practitioner

## 2023-10-15 NOTE — Telephone Encounter (Signed)
Checking percert on the following patient for    VAS CARTOID STUDY    ( in [place] Office ) 10/16/2023

## 2023-10-16 ENCOUNTER — Ambulatory Visit: Payer: 59 | Attending: Nurse Practitioner

## 2023-10-16 DIAGNOSIS — R0989 Other specified symptoms and signs involving the circulatory and respiratory systems: Secondary | ICD-10-CM | POA: Diagnosis not present

## 2023-11-07 ENCOUNTER — Ambulatory Visit: Payer: 59 | Admitting: Nurse Practitioner

## 2023-11-14 ENCOUNTER — Telehealth: Payer: Self-pay

## 2023-11-14 NOTE — Telephone Encounter (Signed)
 Attempted to call for surgery scheduling. Kiara Little

## 2023-11-29 ENCOUNTER — Other Ambulatory Visit: Payer: Self-pay

## 2023-11-29 ENCOUNTER — Telehealth: Payer: Self-pay

## 2023-11-29 ENCOUNTER — Encounter (HOSPITAL_COMMUNITY): Payer: Self-pay | Admitting: Vascular Surgery

## 2023-11-29 DIAGNOSIS — I70235 Atherosclerosis of native arteries of right leg with ulceration of other part of foot: Secondary | ICD-10-CM

## 2023-11-29 NOTE — Telephone Encounter (Signed)
 Patient called to state she accidentally took her blood thinner this morning after being directed to stop Eliquis prior to her procedure on Monday.  This nurse advised that it will be OK but please be mindful not to take any more until after her procedure.

## 2023-11-29 NOTE — Progress Notes (Signed)
 Posting and Prep order was changed after the chart was initially was worked up [place] am but the consent order was not changed (still says right leg). Did not notice until pt was called at 1740 and office had closed. Please check with Lenell Antu to make sure it is to be done Bilaterally and have new consent filled out.   PCP - Raymon Mutton., FNP Cardiologist - Jonelle Sidle, MD  Chest x-ray - 04/29/23 EKG - 04/29/23 Stress Test - 12/29/19 ECHO - 12/29/19   Blood Thinner Instructions: "YOU WILL NEED TO HOLD YOUR PLAVIX FOR 4 DAYS BEFORE YOUR PROCEDURE. TAKE YOUR LAST DOSE ON 11/28/23." Pt called office on 3/14 stating she forgot and took her Plavix that morning. RN at office said it would still be ok to have her procedure on Monday as long as she didn't take any more doses When I spoke with pt she said her last dose was yesterday 11/28/23   Aspirin Instructions: Continue  Anesthesia review: Y  Patient verbally denies any shortness of breath, fever, cough and chest pain during phone call   -------------  SDW INSTRUCTIONS given:  Your procedure is scheduled on Monday, March 17th.  Report to [facility] Main Entrance "A" at 0725 A.M., and check in at the Admitting office.  Call this number if you have problems the morning of surgery:  [phone]   Remember:  Do not eat or drink after midnight the night before your surgery    Take these medicines the morning of surgery with A SIP OF WATER  amLODipine (NORVASC)  aspirin  atorvastatin (LIPITOR)  RESTASIS  HYDROcodone-acetaminophen (NORCO)-if needed  As of today, STOP taking any Aspirin (unless otherwise instructed by your surgeon) VOLTAREN, Aleve, Naproxen, Ibuprofen, Motrin, Advil, Goody's, BC's, all herbal medications, fish oil, and all vitamins.                      Do not wear jewelry, make up, or nail polish            Do not wear lotions, powders, perfumes/colognes, or deodorant.            Do not shave 48 hours prior to  surgery.  Men may shave face and neck.            Do not bring valuables to the hospital.            [facility] is not responsible for any belongings or valuables.  Do NOT Smoke (Tobacco/Vaping) 24 hours prior to your procedure If you use a CPAP at night, you may bring all equipment for your overnight stay.   Contacts, glasses, dentures or bridgework may not be worn into surgery.      For patients admitted to the hospital, discharge time will be determined by your treatment team.   Patients discharged the day of surgery will not be allowed to drive home, and someone needs to stay with them for 24 hours.    Special instructions:   [facility]- Preparing For Surgery  Before surgery, you can play an important role. Because skin is not sterile, your skin needs to be as free of germs as possible. You can reduce the number of germs on your skin by washing with CHG (chlorahexidine gluconate) Soap before surgery.  CHG is an antiseptic cleaner which kills germs and bonds with the skin to continue killing germs even after washing.    Oral Hygiene is also important to reduce your  risk of infection.  Remember - BRUSH YOUR TEETH THE MORNING OF SURGERY WITH YOUR REGULAR TOOTHPASTE  Please do not use if you have an allergy to CHG or antibacterial soaps. If your skin becomes reddened/irritated stop using the CHG.  Do not shave (including legs and underarms) for at least 48 hours prior to first CHG shower. It is OK to shave your face.  Please follow these instructions carefully.   Shower the NIGHT BEFORE SURGERY and the MORNING OF SURGERY with DIAL Soap.   Pat yourself dry with a CLEAN TOWEL.  Wear CLEAN PAJAMAS to bed the night before surgery  Place CLEAN SHEETS on your bed the night of your first shower and DO NOT SLEEP WITH PETS.   Day of Surgery: Please shower morning of surgery  Wear Clean/Comfortable clothing the morning of surgery Do not apply any deodorants/lotions.   Remember to  brush your teeth WITH YOUR REGULAR TOOTHPASTE.   Questions were answered. Patient verbalized understanding of instructions.

## 2023-11-29 NOTE — Telephone Encounter (Signed)
 Patient left a message stating that she received a call from [facility], asking her about her leg and which leg it was, etc.  This nurse returned the call to inform [name] that these calls are normal and that she should answer any questions they may have.  We do have her right leg documented as the leg of interest.

## 2023-12-02 ENCOUNTER — Ambulatory Visit (HOSPITAL_COMMUNITY): Admitting: Anesthesiology

## 2023-12-02 ENCOUNTER — Encounter (HOSPITAL_COMMUNITY)
Admission: RE | Disposition: A | Discharge status: Home / Self Care | Payer: Self-pay | Source: Ambulatory Visit | Attending: Vascular Surgery

## 2023-12-02 ENCOUNTER — Encounter (HOSPITAL_COMMUNITY): Payer: Self-pay | Admitting: Vascular Surgery

## 2023-12-02 ENCOUNTER — Ambulatory Visit (HOSPITAL_COMMUNITY)
Admission: RE | Admit: 2023-12-02 | Discharge: 2023-12-02 | Disposition: A | Discharge status: Home / Self Care | Source: Ambulatory Visit | Attending: Vascular Surgery | Admitting: Vascular Surgery

## 2023-12-02 ENCOUNTER — Other Ambulatory Visit: Payer: Self-pay

## 2023-12-02 DIAGNOSIS — I70235 Atherosclerosis of native arteries of right leg with ulceration of other part of foot: Secondary | ICD-10-CM

## 2023-12-02 DIAGNOSIS — I70203 Unspecified atherosclerosis of native arteries of extremities, bilateral legs: Secondary | ICD-10-CM | POA: Diagnosis present

## 2023-12-02 DIAGNOSIS — Z538 Procedure and treatment not carried out for other reasons: Secondary | ICD-10-CM | POA: Insufficient documentation

## 2023-12-02 LAB — POCT I-STAT, CHEM 8
BUN: 12 mg/dL (ref 8–23)
Calcium, Ion: 1.01 mmol/L — ABNORMAL LOW (ref 1.15–1.40)
Chloride: 107 mmol/L (ref 98–111)
Creatinine, Ser: 0.8 mg/dL (ref 0.44–1.00)
Glucose, Bld: 94 mg/dL (ref 70–99)
HCT: 36 % (ref 36.0–46.0)
Hemoglobin: 12.2 g/dL (ref 12.0–15.0)
Potassium: 3.8 mmol/L (ref 3.5–5.1)
Sodium: 143 mmol/L (ref 135–145)
TCO2: 27 mmol/L (ref 22–32)

## 2023-12-02 SURGERY — ANGIOGRAM, LOWER EXTREMITY
Anesthesia: General | Laterality: Bilateral

## 2023-12-02 MED ORDER — SODIUM CHLORIDE 0.9 % IV SOLN: INTRAVENOUS | Status: DC

## 2023-12-02 MED ORDER — FENTANYL CITRATE (PF) 250 MCG/5ML IJ SOLN
INTRAMUSCULAR | Status: AC
Start: 2023-12-02 — End: ?
  Filled 2023-12-02: qty 5

## 2023-12-02 MED ORDER — ORAL CARE MOUTH RINSE: 15.0000 mL | Freq: Once | OROMUCOSAL | Status: AC

## 2023-12-02 MED ORDER — CHLORHEXIDINE GLUCONATE 4 % EX SOLN: 60.0000 mL | Freq: Once | CUTANEOUS | Status: DC

## 2023-12-02 MED ORDER — CHLORHEXIDINE GLUCONATE 0.12 % MT SOLN
OROMUCOSAL | Status: AC
  Administered 2023-12-02: 15 mL via OROMUCOSAL
  Filled 2023-12-02: qty 15

## 2023-12-02 MED ORDER — CEFAZOLIN SODIUM-DEXTROSE 2-4 GM/100ML-% IV SOLN
INTRAVENOUS | Status: AC
  Filled 2023-12-02: qty 100

## 2023-12-02 MED ORDER — ACETAMINOPHEN 500 MG PO TABS
1000.0000 mg | ORAL_TABLET | Freq: Once | ORAL | Status: AC
  Administered 2023-12-02: 1000 mg via ORAL
  Filled 2023-12-02: qty 2

## 2023-12-02 MED ORDER — CEFAZOLIN SODIUM-DEXTROSE 2-4 GM/100ML-% IV SOLN: 2.0000 g | INTRAVENOUS | Status: DC

## 2023-12-02 MED ORDER — CHLORHEXIDINE GLUCONATE 0.12 % MT SOLN: 15.0000 mL | Freq: Once | OROMUCOSAL | Status: AC

## 2023-12-02 NOTE — Anesthesia Preprocedure Evaluation (Addendum)
 Anesthesia Evaluation  Patient identified by MRN, date of birth, ID band Patient awake    Reviewed: Allergy & Precautions, NPO status , Patient's Chart, lab work & pertinent test results  Airway Mallampati: II  TM Distance: >3 FB Neck ROM: Full    Dental  (+) Dental Advisory Given, Missing   Pulmonary COPD, former smoker   Pulmonary exam normal breath sounds clear to auscultation       Cardiovascular hypertension, Pt. on medications (-) angina + Peripheral Vascular Disease (Critical limb ischemia of right lower extremity with ulceration of foot)  (-) Past MI Normal cardiovascular exam Rhythm:Regular Rate:Normal     Neuro/Psych  PSYCHIATRIC DISORDERS Anxiety     negative neurological ROS     GI/Hepatic negative GI ROS, Neg liver ROS,,,  Endo/Other  negative endocrine ROS    Renal/GU negative Renal ROS     Musculoskeletal  (+) Arthritis ,    Abdominal   Peds  Hematology  (+) Blood dyscrasia (Plavix)   Anesthesia Other Findings Day of surgery medications reviewed with the patient.  Reproductive/Obstetrics                             Anesthesia Physical Anesthesia Plan  ASA: 3  Anesthesia Plan: General   Post-op Pain Management: Tylenol PO (pre-op)*   Induction: Intravenous  PONV Risk Score and Plan: 3 and Dexamethasone and Ondansetron  Airway Management Planned: Oral ETT  Additional Equipment:   Intra-op Plan:   Post-operative Plan: Extubation in OR  Informed Consent: I have reviewed the patients History and Physical, chart, labs and discussed the procedure including the risks, benefits and alternatives for the proposed anesthesia with the patient or authorized representative who has indicated his/her understanding and acceptance.     Dental advisory given  Plan Discussed with: CRNA  Anesthesia Plan Comments:        Anesthesia Quick Evaluation

## 2023-12-02 NOTE — Progress Notes (Signed)
 Cancelled surgery per Dr. Karin Lieu. Patient no longer has a wound on her foot.

## 2023-12-02 NOTE — Progress Notes (Signed)
 Patient was seen and examined in preoperative holding.  From notes, Kiara Little was scheduled for right lower extremity angiogram with possible intervention for critical limb ischemia with tissue loss at the first toe back in September.  This was canceled, she was unable to lay flat.  On presentation today, the right first toe has healed.  When asked about her symptoms, she does not have rest pain, she does not have tissue loss. She does have some numbness in the left lower extremity, however this appears to be musculoskeletal in etiology.  I had a long discussion with both her and her family regarding the above.  While she does have known severe atherosclerotic disease bilaterally, there is no indication for intervention at this time.  The intervention was canceled.  My plan is to see her back in the office in 6 months.  She was asked to call should a new one arise.  Victorino Sparrow MD

## 2023-12-17 ENCOUNTER — Other Ambulatory Visit: Payer: Self-pay

## 2023-12-17 ENCOUNTER — Telehealth: Payer: Self-pay

## 2023-12-17 DIAGNOSIS — I739 Peripheral vascular disease, unspecified: Secondary | ICD-10-CM

## 2023-12-17 NOTE — Telephone Encounter (Signed)
 Triage:  -pt was in person in lobby this am with c/o left foot numb and having problems walking -returned call to pt who stated she had a surgery schedule on the March 17th and the doctor cancelled after she had went to the hospital for it saying she didn't have to have it b/c it might be related to her back and the toe is healed.  She did follow up with neurosurgery today.   -pt states her foot is numb, there is pain in left lower leg and foot pain which is unbearable.  That it hurts to walk, sit, or stand.  She reports that everyone is talking about her right foot because she had the ulcer on it but her left leg has also been bothering her and is worsening.   -per Dr. Chestine Spore - book arterial duplex, ABI's and office visit (see appointment note for duplex/ABI's)

## 2023-12-19 ENCOUNTER — Other Ambulatory Visit: Payer: Self-pay

## 2023-12-19 ENCOUNTER — Telehealth: Payer: Self-pay

## 2023-12-19 DIAGNOSIS — I739 Peripheral vascular disease, unspecified: Secondary | ICD-10-CM

## 2023-12-19 NOTE — Telephone Encounter (Signed)
 Appointment-Duplex & ABI: -discussed w/Dr. Chestine Spore the pt's ability to lay flat on exam table for required studies and these studies are required before pursuing tx options.  -called pt and discussed the requirements of laying on exam table.  Pt confirms understanding that she may not be in a sitting position but it is possible to accommodate with slight elevation of head of table.  These studies require pt be laying down for accuracy.  There are no options for sedation. -pt will call back if unable tolerate already scheduled studies.

## 2024-01-07 ENCOUNTER — Encounter (HOSPITAL_COMMUNITY)

## 2024-01-07 ENCOUNTER — Ambulatory Visit

## 2024-01-27 ENCOUNTER — Encounter: Payer: Self-pay | Admitting: Cardiology

## 2024-01-27 ENCOUNTER — Ambulatory Visit: Payer: 59 | Attending: Cardiology | Admitting: Cardiology

## 2024-01-27 VITALS — BP 130/60 | HR 52 | Ht 69.0 in | Wt 190.2 lb

## 2024-01-27 DIAGNOSIS — E782 Mixed hyperlipidemia: Secondary | ICD-10-CM | POA: Diagnosis not present

## 2024-01-27 DIAGNOSIS — I10 Essential (primary) hypertension: Secondary | ICD-10-CM | POA: Diagnosis not present

## 2024-01-27 DIAGNOSIS — Z79899 Other long term (current) drug therapy: Secondary | ICD-10-CM

## 2024-01-27 DIAGNOSIS — I739 Peripheral vascular disease, unspecified: Secondary | ICD-10-CM

## 2024-01-27 MED ORDER — ASPIRIN 81 MG PO TBEC: 81.0000 mg | DELAYED_RELEASE_TABLET | Freq: Every day | ORAL | Status: AC

## 2024-01-27 NOTE — Progress Notes (Signed)
    Cardiology Office Note  Date: 01/27/2024   ID: Kiara Little, DOB 1936-05-05, MRN [id number]  History of Present Illness: Kiara Little is an 88 y.o. female last seen in January by Ms. Clementine Cutting NP, I reviewed the note.  She is here today with her daughter for a follow-up visit.  She describes occasional brief, atypical sensations in her chest usually at nighttime, nothing with exertion.  No sudden dizziness or syncope.  She states that she has had a feeling of numbness and discomfort in her left leg, no lower extremity wounds or ulcerations however.  She has a pending visit with [facility] this month and further vascular studies planned as well.  PCP is with [facility] in [place].  She is meeting a new provider.  No interval lab work regarding lipid follow-up.  We went over her medications today.  Physical Exam: VS:  BP 130/60 (BP Location: Right Arm)   Pulse (!) 52   Ht 5\' 9"  (1.753 m)   Wt 190 lb 3.2 oz (86.3 kg)   SpO2 97%   BMI 28.09 kg/m , BMI Body mass index is 28.09 kg/m.  Wt Readings from Last 3 Encounters:  01/27/24 190 lb 3.2 oz (86.3 kg)  12/02/23 188 lb (85.3 kg)  10/07/23 178 lb (80.7 kg)    General: Patient appears comfortable at rest. HEENT: Conjunctiva and lids normal. Neck: Supple, no elevated JVP or carotid bruits. Lungs: Clear to auscultation, nonlabored breathing at rest. Cardiac: RRR with ectopy, soft systolic murmur without gallop.  ECG:  An ECG dated 04/29/2023 was personally reviewed today and demonstrated:  Sinus rhythm with PAC, left bundle branch block.  Labwork: 04/29/2023: Platelets 242 12/02/2023: BUN 12; Creatinine, Ser 0.80; Hemoglobin 12.2; Potassium 3.8; Sodium 143  April 2024: Cholesterol 141, triglycerides 74, HDL 33, LDL 92 August 2024: Potassium 3.6, BUN 17, creatinine 1.04, GFR 53, hemoglobin 12, platelets 242  Other Studies Reviewed Today:  No interval cardiac testing for review today.  Assessment and Plan:  1.  Mixed  hyperlipidemia.  Last LDL 92 in April 2024, Lipitor has been increased to 40 mg daily since that time, she reports compliance.  Follow-up FLP.  2.  PAD followed by Dr. Rosalva Comber with [facility].  She is scheduled for follow-up ABI and lower extremity arterial Dopplers.  She reports no lower extremity wounds or ulcerations.  Does describe numbness and discomfort mainly left-sided at this point.  States that she has a back MRI pending, could be neuropathic symptoms as well, but she has follow-up with [facility] soon for further discussion.  Continue aspirin  81 mg daily and Plavix  75 mg daily.   3.  Primary hypertension.  Continue Norvasc  10 mg daily.  4.  Asymptomatic carotid artery disease, 1 to 39% mild bilateral ICA stenosis by Dopplers in January.  Continue aspirin  and statin.  Disposition:  Follow up 6 months.  Signed, Gerard Knight, M.D., F.A.C.C. [facility] at [facility]

## 2024-01-27 NOTE — Patient Instructions (Addendum)
 Medication Instructions:  Your physician has recommended you make the following change in your medication:  Decrease aspirin  to 81 mg daily Continue all other medications as prescribed  Labwork: Your physician recommends that you return for a FASTING lipid profile as soon as possible. Please do not eat or drink for at least 8 hours when you have this done. You may take your medications that morning with a sip of water . [facility] ([address]. [place])  Testing/Procedures: none  Follow-Up: Your physician recommends that you schedule a follow-up appointment in: 6 months  Any Other Special Instructions Will Be Listed Below (If Applicable).  If you need a refill on your cardiac medications before your next appointment, please call your pharmacy.

## 2024-01-29 ENCOUNTER — Ambulatory Visit: Payer: Self-pay | Admitting: Cardiology

## 2024-01-29 LAB — LIPID PANEL
Chol/HDL Ratio: 3.3 ratio (ref 0.0–4.4)
Cholesterol, Total: 147 mg/dL (ref 100–199)
HDL: 44 mg/dL (ref >39)
LDL Chol Calc (NIH): 90 mg/dL (ref 0–99)
Triglycerides: 62 mg/dL (ref 0–149)
VLDL Cholesterol Cal: 13 mg/dL (ref 5–40)

## 2024-01-30 ENCOUNTER — Encounter (HOSPITAL_COMMUNITY)

## 2024-01-30 ENCOUNTER — Ambulatory Visit

## 2024-02-12 MED ORDER — ATORVASTATIN CALCIUM 80 MG PO TABS: 80.0000 mg | ORAL_TABLET | Freq: Every day | ORAL | 3 refills | Status: AC

## 2024-02-19 NOTE — Progress Notes (Unsigned)
 HISTORY AND PHYSICAL     CC:  follow up. Requesting Provider:  Shannan Dart., FNP  HPI: This is a 88 y.o. female who is here today for follow up for PAD.  Pt has hx of  angiogram with right ATA angioplasty via retrograde right AT approach at the ankle and right SFA angioplasty via retrograde right AT approach at ankle by Dr. Fulton Job 01/12/2021 for CLI with non healing right great toe wound.    Pt was last seen in the office by Dr. Fulton Job on 05/21/2023 at which time she had a new right great toe ulcer.  He recommended aortogram with focus of right leg.  She was scheduled for 05/30/2023 and she could not lay flat for the procedure and therefore the procedure was cancelled. Plan was to reschedule.    Pt did have a carotid duplex on 10/16/2023 that revealed 1-39% bilateral ICA stenosis.    Pt was last seen in the hospital on 12/02/2023 when her angiogram had been rescheduled with Dr. Rosalva Comber and at that time, the right great toe had healed.  She was not having any rest pain or tissue loss. She did have some numbness but appeared to be musculoskeletal in nature.  She came to our office a couple of weeks later complaining of foot and leg numbness.   It is noted she had seen neurosurgery that day but I am unable to see those notes.   She did see podiatry on 02/14/2024 at which time they trimmed her toenails and callus.    The pt returns today for follow up.  ***  The pt is on a statin for cholesterol management.    The pt is on an aspirin .    Other AC:  Plavix  The pt is on CCB for hypertension.  The pt is not on medication for diabetes. Tobacco hx:  former  Pt does *** have family hx of AAA.  Past Medical History:  Diagnosis Date   Chronic back pain    COPD (chronic obstructive pulmonary disease) (HCC)    COVID-06 October 2019   Hyperlipidemia    Hypertension    Muscle spasm    Noncompliance    Osteoarthritis    a. s/p R THA   PAD (peripheral artery disease) (HCC)     Past Surgical  History:  Procedure Laterality Date   ABDOMINAL AORTOGRAM W/LOWER EXTREMITY N/A 01/12/2021   Procedure: ABDOMINAL AORTOGRAM W/LOWER EXTREMITY;  Surgeon: Young Hensen, MD;  Location: [facility];  Service: Cardiovascular;  Laterality: N/A;   BACK SURGERY     Cataract surgeries Right 07/2019   JOINT REPLACEMENT  2000   RIGHT TOTAL HIP ARTHROPLASTY   PERIPHERAL VASCULAR BALLOON ANGIOPLASTY Right 01/12/2021   Procedure: PERIPHERAL VASCULAR BALLOON ANGIOPLASTY;  Surgeon: Young Hensen, MD;  Location: [facility];  Service: Cardiovascular;  Laterality: Right;  AT and SFA   SURGERY FOR TUBAL PREGNANCY     TOTAL HIP ARTHROPLASTY Left 07/24/2013   Procedure: LEFT TOTAL HIP ARTHROPLASTY ANTERIOR APPROACH;  Surgeon: Arnie Lao, MD;  Location: [facility];  Service: Orthopedics;  Laterality: Left;    No Known Allergies  Current Outpatient Medications  Medication Sig Dispense Refill   amLODipine  (NORVASC ) 10 MG tablet Take 10 mg by mouth every morning.     aspirin  EC 81 MG tablet Take 1 tablet (81 mg total) by mouth daily. Swallow whole.     atorvastatin  (LIPITOR) 80 MG tablet Take 1  tablet (80 mg total) by mouth daily. 90 tablet 3   cholecalciferol (VITAMIN D3) 25 MCG (1000 UNIT) tablet Take 1,000 Units by mouth daily.     clopidogrel  (PLAVIX ) 75 MG tablet TAKE 1 TABLET BY MOUTH EVERY DAY 90 tablet 3   diclofenac Sodium (VOLTAREN) 1 % GEL Apply 1 Application topically 4 (four) times daily as needed (pain).     HYDROcodone -acetaminophen  (NORCO) 7.5-325 MG tablet Take 1 tablet by mouth every 8 (eight) hours as needed (pain.).     RESTASIS 0.05 % ophthalmic emulsion Place 1 drop into both eyes in the morning, at noon, and at bedtime.     No current facility-administered medications for this visit.    Family History  Problem Relation Age of Onset   Heart attack Mother    Diabetes Father    Cancer Brother     Social History   Socioeconomic History   Marital  status: Legally Separated    Spouse name: Not on file   Number of children: Not on file   Years of education: Not on file   Highest education level: Not on file  Occupational History   Not on file  Tobacco Use   Smoking status: Former    Current packs/day: 0.00    Average packs/day: 0.5 packs/day for 40.0 years (20.0 ttl pk-yrs)    Types: Cigarettes    Start date: 07/24/1973    Quit date: 07/24/2013    Years since quitting: 10.5    Passive exposure: Never   Smokeless tobacco: Never  Vaping Use   Vaping status: Never Used  Substance and Sexual Activity   Alcohol use: No   Drug use: No   Sexual activity: Not on file  Other Topics Concern   Not on file  Social History Narrative   Pt lives in [place] with her son.  She is retired.  She does not exercise.   Social Drivers of Corporate investment banker Strain: Not on file  Food Insecurity: Low Risk  (01/04/2023)   [facility]   Hunger Vital Sign    Worried About Running Out of Food in the Last Year: Never true    Ran Out of Food in the Last Year: Never true  Transportation Needs: No Transportation Needs (01/04/2023)   [facility]   Transportation    In the past 12 months, has lack of reliable transportation kept you from medical appointments, meetings, work or from getting things needed for daily living? : No  Physical Activity: Not on file  Stress: Not on file  Social Connections: Not on file  Intimate Partner Violence: Not on file     REVIEW OF SYSTEMS:  *** [X]  denotes positive finding, [ ]  denotes negative finding Cardiac  Comments:  Chest pain or chest pressure:    Shortness of breath upon exertion:    Short of breath when lying flat:    Irregular heart rhythm:        Vascular    Pain in calf, thigh, or hip brought on by ambulation:    Pain in feet at night that wakes you up from your sleep:     Blood clot in your veins:    Leg swelling:          Pulmonary    Oxygen at home:    Productive cough:     Wheezing:         Neurologic    Sudden weakness  in arms or legs:     Sudden numbness in arms or legs:     Sudden onset of difficulty speaking or slurred speech:    Temporary loss of vision in one eye:     Problems with dizziness:         Gastrointestinal    Blood in stool:     Vomited blood:         Genitourinary    Burning when urinating:     Blood in urine:        Psychiatric    Major depression:         Hematologic    Bleeding problems:    Problems with blood clotting too easily:        Skin    Rashes or ulcers:        Constitutional    Fever or chills:      PHYSICAL EXAMINATION:  ***  General:  WDWN in NAD; vital signs documented above Gait: Not observed HENT: WNL, normocephalic Pulmonary: normal non-labored breathing , without wheezing Cardiac: {Desc; regular/irreg:14544} HR, {With/Without:20273} carotid bruit*** Abdomen: soft, NT; aortic pulse is *** palpable Skin: {With/Without:20273} rashes Vascular Exam/Pulses:  Right Left  Radial {Exam; arterial pulse strength 0-4:30167} {Exam; arterial pulse strength 0-4:30167}  Femoral {Exam; arterial pulse strength 0-4:30167} {Exam; arterial pulse strength 0-4:30167}  Popliteal {Exam; arterial pulse strength 0-4:30167} {Exam; arterial pulse strength 0-4:30167}  DP {Exam; arterial pulse strength 0-4:30167} {Exam; arterial pulse strength 0-4:30167}  PT {Exam; arterial pulse strength 0-4:30167} {Exam; arterial pulse strength 0-4:30167}  Peroneal *** ***   Extremities: {With/Without:20273} ischemic changes, {With/Without:20273} Gangrene , {With/Without:20273} cellulitis; {With/Without:20273} open wounds Musculoskeletal: no muscle wasting or atrophy  Neurologic: A&O X 3 Psychiatric:  The pt has {Desc; normal/abnormal:11317::"Normal"} affect.   Non-Invasive Vascular Imaging:   ABI's/TBI's on 02/20/2024: Right:  *** - Great toe pressure: *** Left:  *** - Great  toe pressure: ***  Arterial duplex on 02/20/2024: ***  Previous ABI's/TBI's on 05/21/2023: Right:  0.87/0.30 - Great toe pressure: 43 Left:  0.86/0.57 - Great toe pressure:  82  Previous arterial duplex on 05/21/2023: +-----------+--------+-----+---------------+----------+--------+  RIGHT     PSV cm/sRatioStenosis       Waveform  Comments  +-----------+--------+-----+---------------+----------+--------+  CFA Distal 93                          triphasic           +-----------+--------+-----+---------------+----------+--------+  DFA       73                          triphasic           +-----------+--------+-----+---------------+----------+--------+  SFA Prox   83                          triphasic           +-----------+--------+-----+---------------+----------+--------+  SFA Mid    70                          triphasic           +-----------+--------+-----+---------------+----------+--------+  SFA Distal 78                          triphasic           +-----------+--------+-----+---------------+----------+--------+  POP  Prox   342     4.4  50-74% - 75-99%triphasic           +-----------+--------+-----+---------------+----------+--------+  POP Mid    94                          triphasic           +-----------+--------+-----+---------------+----------+--------+  POP Distal 33                          monophasic          +-----------+--------+-----+---------------+----------+--------+  ATA Distal 0            occluded                           +-----------+--------+-----+---------------+----------+--------+  PTA Distal 0            occluded                           +-----------+--------+-----+---------------+----------+--------+  PERO Distal32                          monophasic          +-----------+--------+-----+---------------+----------+--------+   Summary:  Right: Total occlusion noted in the  anterior tibial artery. Total  occlusion noted in the posterior tibial artery. Right above knee popliteal  artery 50-74% stenosis based on PSV, or 75-99% stenosis based on PSV ratio >4.0.     ASSESSMENT/PLAN:: 88 y.o. female here for follow up for PAD with hx of ***   -*** -continue asa/statin/plavix  -pt will f/u in *** with ***.   Maryanna Smart, [facility] [phone]  Clinic MD:   Edgardo Goodwill on call MD

## 2024-02-20 ENCOUNTER — Ambulatory Visit (HOSPITAL_COMMUNITY)
Admission: RE | Admit: 2024-02-20 | Discharge: 2024-02-20 | Disposition: A | Discharge status: Home / Self Care | Source: Ambulatory Visit | Attending: Vascular Surgery | Admitting: Vascular Surgery

## 2024-02-20 ENCOUNTER — Encounter (HOSPITAL_COMMUNITY)

## 2024-02-20 ENCOUNTER — Ambulatory Visit: Attending: Vascular Surgery | Admitting: Physician Assistant

## 2024-02-20 VITALS — BP 134/76 | HR 50 | Temp 98.3°F | Ht 69.0 in | Wt 190.0 lb

## 2024-02-20 DIAGNOSIS — I739 Peripheral vascular disease, unspecified: Secondary | ICD-10-CM | POA: Diagnosis present

## 2024-02-20 LAB — VAS US ABI WITH/WO TBI
Left ABI: 0.61
Right ABI: 0.61

## 2024-02-21 ENCOUNTER — Other Ambulatory Visit: Payer: Self-pay | Admitting: *Deleted

## 2024-02-21 DIAGNOSIS — I70221 Atherosclerosis of native arteries of extremities with rest pain, right leg: Secondary | ICD-10-CM

## 2024-02-21 DIAGNOSIS — I739 Peripheral vascular disease, unspecified: Secondary | ICD-10-CM

## 2024-04-14 ENCOUNTER — Encounter: Payer: Self-pay | Admitting: Vascular Surgery

## 2024-04-14 ENCOUNTER — Ambulatory Visit (HOSPITAL_COMMUNITY)
Admission: RE | Admit: 2024-04-14 | Discharge: 2024-04-14 | Disposition: A | Discharge status: Home / Self Care | Source: Ambulatory Visit | Attending: Vascular Surgery | Admitting: Vascular Surgery

## 2024-04-14 ENCOUNTER — Ambulatory Visit (INDEPENDENT_AMBULATORY_CARE_PROVIDER_SITE_OTHER): Admitting: Vascular Surgery

## 2024-04-14 ENCOUNTER — Ambulatory Visit (HOSPITAL_BASED_OUTPATIENT_CLINIC_OR_DEPARTMENT_OTHER)
Admission: RE | Admit: 2024-04-14 | Discharge: 2024-04-14 | Disposition: A | Discharge status: Home / Self Care | Source: Ambulatory Visit | Attending: Vascular Surgery | Admitting: Vascular Surgery

## 2024-04-14 VITALS — BP 126/72 | HR 58 | Temp 98.3°F | Resp 18 | Ht 69.0 in | Wt 190.0 lb

## 2024-04-14 DIAGNOSIS — I70221 Atherosclerosis of native arteries of extremities with rest pain, right leg: Secondary | ICD-10-CM | POA: Diagnosis present

## 2024-04-14 DIAGNOSIS — I70222 Atherosclerosis of native arteries of extremities with rest pain, left leg: Secondary | ICD-10-CM

## 2024-04-14 DIAGNOSIS — I739 Peripheral vascular disease, unspecified: Secondary | ICD-10-CM | POA: Insufficient documentation

## 2024-04-14 LAB — VAS US ABI WITH/WO TBI
Left ABI: 0.62
Right ABI: 0.52

## 2024-04-14 NOTE — Progress Notes (Signed)
 Patient name: Kiara Little MRN: [id number] DOB: 1935/10/11 Sex: female  REASON FOR CONSULT: PAD follow-up  HPI: Kiara Little is a 88 y.o. female, with multiple medical problems including COPD, hypertension, hyperlipidemia, PAD, chronic back pain that presents for PAD follow-up.  Patient describes increasing pain in her left foot.  States she cannot sleep.  Ongoing for a year and now happening constantly.  This is opposite the leg we have treated in the past.  Patient previously underwent right AT angioplasty as well as right SFA angioplasty via retrograde AT approach on 01/12/2021 for a wound that ultimately healed.    Past Medical History:  Diagnosis Date   Chronic back pain    COPD (chronic obstructive pulmonary disease) (HCC)    COVID-06 October 2019   Hyperlipidemia    Hypertension    Muscle spasm    Noncompliance    Osteoarthritis    a. s/p R THA   PAD (peripheral artery disease) (HCC)     Past Surgical History:  Procedure Laterality Date   ABDOMINAL AORTOGRAM W/LOWER EXTREMITY N/A 01/12/2021   Procedure: ABDOMINAL AORTOGRAM W/LOWER EXTREMITY;  Surgeon: Gretta Lonni PARAS, MD;  Location: [facility];  Service: Cardiovascular;  Laterality: N/A;   BACK SURGERY     Cataract surgeries Right 07/2019   JOINT REPLACEMENT  2000   RIGHT TOTAL HIP ARTHROPLASTY   PERIPHERAL VASCULAR BALLOON ANGIOPLASTY Right 01/12/2021   Procedure: PERIPHERAL VASCULAR BALLOON ANGIOPLASTY;  Surgeon: Gretta Lonni PARAS, MD;  Location: [facility];  Service: Cardiovascular;  Laterality: Right;  AT and SFA   SURGERY FOR TUBAL PREGNANCY     TOTAL HIP ARTHROPLASTY Left 07/24/2013   Procedure: LEFT TOTAL HIP ARTHROPLASTY ANTERIOR APPROACH;  Surgeon: Lonni CINDERELLA Poli, MD;  Location: [facility];  Service: Orthopedics;  Laterality: Left;    Family History  Problem Relation Age of Onset   Heart attack Mother    Diabetes Father    Cancer Brother     SOCIAL HISTORY: Social  History   Socioeconomic History   Marital status: Legally Separated    Spouse name: Not on file   Number of children: Not on file   Years of education: Not on file   Highest education level: Not on file  Occupational History   Not on file  Tobacco Use   Smoking status: Former    Current packs/day: 0.00    Average packs/day: 0.5 packs/day for 40.0 years (20.0 ttl pk-yrs)    Types: Cigarettes    Start date: 07/24/1973    Quit date: 07/24/2013    Years since quitting: 10.7    Passive exposure: Never   Smokeless tobacco: Never  Vaping Use   Vaping status: Never Used  Substance and Sexual Activity   Alcohol use: No   Drug use: No   Sexual activity: Not on file  Other Topics Concern   Not on file  Social History Narrative   Pt lives in [place] with her son.  She is retired.  She does not exercise.   Social Drivers of Corporate investment banker Strain: Not on file  Food Insecurity: Low Risk  (01/04/2023)   Received from [facility]   Hunger Vital Sign    Within the past 12 months, you worried that your food would run out before you got money to buy more: Never true    Within the past 12 months, the food you bought just didn't last and you  didn't have money to get more. : Never true  Transportation Needs: No Transportation Needs (01/04/2023)   Received from [employer]    In the past 12 months, has lack of reliable transportation kept you from medical appointments, meetings, work or from getting things needed for daily living? : No  Physical Activity: Not on file  Stress: Not on file  Social Connections: Not on file  Intimate Partner Violence: Not on file    No Known Allergies  Current Outpatient Medications  Medication Sig Dispense Refill   amLODipine  (NORVASC ) 10 MG tablet Take 10 mg by mouth every morning.     aspirin  EC 81 MG tablet Take 1 tablet (81 mg total) by mouth daily. Swallow whole.     atorvastatin  (LIPITOR) 80 MG tablet Take 1 tablet  (80 mg total) by mouth daily. 90 tablet 3   cholecalciferol (VITAMIN D3) 25 MCG (1000 UNIT) tablet Take 1,000 Units by mouth daily.     clopidogrel  (PLAVIX ) 75 MG tablet TAKE 1 TABLET BY MOUTH EVERY DAY 90 tablet 3   diclofenac Sodium (VOLTAREN) 1 % GEL Apply 1 Application topically 4 (four) times daily as needed (pain).     HYDROcodone -acetaminophen  (NORCO) 7.5-325 MG tablet Take 1 tablet by mouth every 8 (eight) hours as needed (pain.).     RESTASIS 0.05 % ophthalmic emulsion Place 1 drop into both eyes in the morning, at noon, and at bedtime.     No current facility-administered medications for this visit.    REVIEW OF SYSTEMS:  [X]  denotes positive finding, [ ]  denotes negative finding Cardiac  Comments:  Chest pain or chest pressure:    Shortness of breath upon exertion:    Short of breath when lying flat:    Irregular heart rhythm:        Vascular    Pain in calf, thigh, or hip brought on by ambulation:    Pain in feet at night that wakes you up from your sleep:     Blood clot in your veins:    Leg swelling:         Pulmonary    Oxygen at home:    Productive cough:     Wheezing:         Neurologic    Sudden weakness in arms or legs:     Sudden numbness in arms or legs:     Sudden onset of difficulty speaking or slurred speech:    Temporary loss of vision in one eye:     Problems with dizziness:         Gastrointestinal    Blood in stool:     Vomited blood:         Genitourinary    Burning when urinating:     Blood in urine:        Psychiatric    Major depression:         Hematologic    Bleeding problems:    Problems with blood clotting too easily:        Skin    Rashes or ulcers:        Constitutional    Fever or chills:      PHYSICAL EXAM: Vitals:   04/14/24 1536  BP: 126/72  Pulse: (!) 58  Resp: 18  Temp: 98.3 F (36.8 C)  TempSrc: Temporal  SpO2: 97%  Weight: 190 lb (86.2 kg)  Height: 5' 9 (1.753 m)    GENERAL: The patient is a  well-nourished female, in no acute distress. The vital signs are documented above. CARDIAC: There is a regular rate and rhythm.  VASCULAR:  Bilateral femoral pulses palpable No palpable pedal pulses PULMONARY: No respiratory distress. ABDOMEN: Soft and non-tender. MUSCULOSKELETAL: There are no major deformities or cyanosis. NEUROLOGIC: No focal weakness or paresthesias are detected. PSYCHIATRIC: The patient has a normal affect.   DATA:   ABIs today are 0.52 on the right and 0.62 on the left  LOWER EXTREMITY ARTERIAL DUPLEX STUDY  Patient Name:  Kiara Little  Date of Exam:   02/20/2024 Medical Rec #: [id number]        Accession #:    [id number] Date of Birth: [birth year]         Patient Gender: F Patient Age:   59 years Exam Location:  [address] Procedure:      VAS US  LOWER EXTREMITY ARTERIAL DUPLEX Referring Phys: LONNI GASKINS   --------------------------------------------------------------------------- -----   Indications: Peripheral artery disease.  High Risk Factors: Hypertension, hyperlipidemia, past history of smoking.    Current ABI: 0.61/0.61  Comparison Study: None.  Performing Technologist: Garnette Rockers    Examination Guidelines: A complete evaluation includes B-mode imaging, spectral Doppler, color Doppler, and power Doppler as needed of all accessible portions of each vessel. Bilateral testing is considered an integral part of a complete examination. Limited examinations for reoccurring indications may be performed as noted.         +-----------+--------+-----+---------------+----------+--------+ LEFT       PSV cm/sRatioStenosis       Waveform  Comments +-----------+--------+-----+---------------+----------+--------+ CFA Distal 124                         triphasic          +-----------+--------+-----+---------------+----------+--------+ DFA        72                          biphasic            +-----------+--------+-----+---------------+----------+--------+ SFA Prox   99                          biphasic           +-----------+--------+-----+---------------+----------+--------+ SFA Mid    111                         biphasic           +-----------+--------+-----+---------------+----------+--------+ SFA Distal 209          50-74% stenosisbiphasic           +-----------+--------+-----+---------------+----------+--------+ POP Prox   148          50-74% stenosisbiphasic           +-----------+--------+-----+---------------+----------+--------+ POP Distal 34                                             +-----------+--------+-----+---------------+----------+--------+ ATA Prox   202          50-74% stenosistriphasic          +-----------+--------+-----+---------------+----------+--------+ ATA Distal 17                          monophasic         +-----------+--------+-----+---------------+----------+--------+  PTA Mid    57                          monophasic         +-----------+--------+-----+---------------+----------+--------+ PERO Distal39                          monophasic         +-----------+--------+-----+---------------+----------+--------+       Summary: Left: 50-74% stenosis noted in the superficial femoral artery and/or popliteal artery. 50-74% stenosis noted in the popliteal artery. 50-74% stenosis noted in the anterior tibial artery.    See table(s) above for measurements and observations.     Electronically signed by Lonni Gaskins MD on 02/20/2024 at 3:06:46 PM.    Assessment/Plan:   88 y.o. female, with multiple medical problems including COPD, hypertension, hyperlipidemia, PAD, chronic back pain that presents for PAD follow-up.  Her complaint today is the left foot specifically.  This is opposite the leg we have treated in the past where she has had tissue loss requiring prior  infrainguinal intervention.  Her ABIs are in the 0.6 range but she has a classic description of rest pain with the foot aching and keeping her awake.  I have offered her aortogram, lower extremity arteriogram with a focus on the left leg.  Duplex imaging does show moderate disease throughout the SFA popliteal and tibial segments.  This could involve SFA popliteal angioplasty and/or stent as well as tibial angioplasty.  Discussed this being done in the Cath Lab under moderate sedation.  He does have chronic back pain but I think we can hopefully get her comfortable enough.   Lonni DOROTHA Gaskins, MD [facility] Office: [phone]

## 2024-04-15 ENCOUNTER — Telehealth: Payer: Self-pay

## 2024-04-15 NOTE — Telephone Encounter (Signed)
 Attempted to call for surgery scheduling. Kiara Little

## 2024-04-16 ENCOUNTER — Other Ambulatory Visit: Payer: Self-pay

## 2024-04-16 DIAGNOSIS — I70222 Atherosclerosis of native arteries of extremities with rest pain, left leg: Secondary | ICD-10-CM

## 2024-05-07 ENCOUNTER — Encounter (HOSPITAL_COMMUNITY)
Admission: RE | Disposition: A | Discharge status: Home / Self Care | Payer: Self-pay | Source: Home / Self Care | Attending: Vascular Surgery

## 2024-05-07 ENCOUNTER — Observation Stay (HOSPITAL_COMMUNITY)
Admission: RE | Admit: 2024-05-07 | Discharge: 2024-05-08 | Disposition: A | Discharge status: Home / Self Care | Attending: Vascular Surgery | Admitting: Vascular Surgery

## 2024-05-07 ENCOUNTER — Other Ambulatory Visit: Payer: Self-pay

## 2024-05-07 DIAGNOSIS — Z87891 Personal history of nicotine dependence: Secondary | ICD-10-CM | POA: Diagnosis not present

## 2024-05-07 DIAGNOSIS — I7092 Chronic total occlusion of artery of the extremities: Secondary | ICD-10-CM

## 2024-05-07 DIAGNOSIS — J449 Chronic obstructive pulmonary disease, unspecified: Secondary | ICD-10-CM | POA: Diagnosis not present

## 2024-05-07 DIAGNOSIS — I10 Essential (primary) hypertension: Secondary | ICD-10-CM | POA: Diagnosis not present

## 2024-05-07 DIAGNOSIS — I739 Peripheral vascular disease, unspecified: Secondary | ICD-10-CM | POA: Diagnosis present

## 2024-05-07 DIAGNOSIS — Z79899 Other long term (current) drug therapy: Secondary | ICD-10-CM | POA: Insufficient documentation

## 2024-05-07 DIAGNOSIS — M549 Dorsalgia, unspecified: Secondary | ICD-10-CM | POA: Diagnosis not present

## 2024-05-07 DIAGNOSIS — E785 Hyperlipidemia, unspecified: Secondary | ICD-10-CM | POA: Insufficient documentation

## 2024-05-07 DIAGNOSIS — I70222 Atherosclerosis of native arteries of extremities with rest pain, left leg: Principal | ICD-10-CM

## 2024-05-07 DIAGNOSIS — I771 Stricture of artery: Secondary | ICD-10-CM | POA: Diagnosis not present

## 2024-05-07 DIAGNOSIS — Z7982 Long term (current) use of aspirin: Secondary | ICD-10-CM | POA: Diagnosis not present

## 2024-05-07 DIAGNOSIS — G8929 Other chronic pain: Secondary | ICD-10-CM | POA: Insufficient documentation

## 2024-05-07 HISTORY — PX: LOWER EXTREMITY INTERVENTION: CATH118252

## 2024-05-07 HISTORY — PX: ABDOMINAL AORTOGRAM: CATH118222

## 2024-05-07 HISTORY — PX: LOWER EXTREMITY ANGIOGRAPHY: CATH118251

## 2024-05-07 LAB — BASIC METABOLIC PANEL WITH GFR
Anion gap: 9 (ref 5–15)
BUN: 12 mg/dL (ref 8–23)
CO2: 25 mmol/L (ref 22–32)
Calcium: 8.5 mg/dL — ABNORMAL LOW (ref 8.9–10.3)
Chloride: 109 mmol/L (ref 98–111)
Creatinine, Ser: 0.89 mg/dL (ref 0.44–1.00)
GFR, Estimated: >60 mL/min (ref >60)
Glucose, Bld: 133 mg/dL — ABNORMAL HIGH (ref 70–99)
Potassium: 4.3 mmol/L (ref 3.5–5.1)
Sodium: 143 mmol/L (ref 135–145)

## 2024-05-07 LAB — POCT I-STAT, CHEM 8
BUN: 15 mg/dL (ref 8–23)
Calcium, Ion: 1.2 mmol/L (ref 1.15–1.40)
Chloride: 105 mmol/L (ref 98–111)
Creatinine, Ser: 0.9 mg/dL (ref 0.44–1.00)
Glucose, Bld: 91 mg/dL (ref 70–99)
HCT: 38 % (ref 36.0–46.0)
Hemoglobin: 12.9 g/dL (ref 12.0–15.0)
Potassium: 4.2 mmol/L (ref 3.5–5.1)
Sodium: 144 mmol/L (ref 135–145)
TCO2: 27 mmol/L (ref 22–32)

## 2024-05-07 LAB — CBC
HCT: 32.4 % — ABNORMAL LOW (ref 36.0–46.0)
Hemoglobin: 10 g/dL — ABNORMAL LOW (ref 12.0–15.0)
MCH: 29.5 pg (ref 26.0–34.0)
MCHC: 30.9 g/dL (ref 30.0–36.0)
MCV: 95.6 fL (ref 80.0–100.0)
Platelets: 229 K/uL (ref 150–400)
RBC: 3.39 MIL/uL — ABNORMAL LOW (ref 3.87–5.11)
RDW: 16.1 % — ABNORMAL HIGH (ref 11.5–15.5)
WBC: 10.7 K/uL — ABNORMAL HIGH (ref 4.0–10.5)
nRBC: 0 % (ref 0.0–0.2)

## 2024-05-07 SURGERY — ABDOMINAL AORTOGRAM
Anesthesia: LOCAL

## 2024-05-07 MED ORDER — ASPIRIN 81 MG PO TBEC
81.0000 mg | DELAYED_RELEASE_TABLET | Freq: Every day | ORAL | Status: DC
  Administered 2024-05-08: 81 mg via ORAL
  Filled 2024-05-07: qty 1

## 2024-05-07 MED ORDER — SODIUM CHLORIDE 0.9 % IV SOLN: Freq: Once | INTRAVENOUS | Status: AC

## 2024-05-07 MED ORDER — HYDRALAZINE HCL 20 MG/ML IJ SOLN: 5.0000 mg | INTRAMUSCULAR | Status: DC | PRN

## 2024-05-07 MED ORDER — ONDANSETRON HCL 4 MG/2ML IJ SOLN
4.0000 mg | Freq: Four times a day (QID) | INTRAMUSCULAR | Status: DC | PRN
  Administered 2024-05-07: 4 mg via INTRAVENOUS
  Filled 2024-05-07 (×2): qty 2

## 2024-05-07 MED ORDER — ONDANSETRON HCL 4 MG/2ML IJ SOLN
4.0000 mg | Freq: Once | INTRAMUSCULAR | Status: AC
  Administered 2024-05-07: 4 mg via INTRAVENOUS

## 2024-05-07 MED ORDER — HYDROCODONE-ACETAMINOPHEN 7.5-325 MG PO TABS: 1.0000 | ORAL_TABLET | Freq: Three times a day (TID) | ORAL | Status: DC | PRN

## 2024-05-07 MED ORDER — HEPARIN SODIUM (PORCINE) 1000 UNIT/ML IJ SOLN
INTRAMUSCULAR | Status: DC | PRN
  Administered 2024-05-07: 9000 [IU] via INTRAVENOUS

## 2024-05-07 MED ORDER — ACETAMINOPHEN 325 MG PO TABS
650.0000 mg | ORAL_TABLET | ORAL | Status: DC | PRN
Start: 2024-05-07 — End: 2024-05-07

## 2024-05-07 MED ORDER — IODIXANOL 320 MG/ML IV SOLN
INTRAVENOUS | Status: DC | PRN
  Administered 2024-05-07: 90 mL via INTRA_ARTERIAL

## 2024-05-07 MED ORDER — HEPARIN SODIUM (PORCINE) 1000 UNIT/ML IJ SOLN
INTRAMUSCULAR | Status: AC
  Filled 2024-05-07: qty 10

## 2024-05-07 MED ORDER — MIDAZOLAM HCL 2 MG/2ML IJ SOLN
INTRAMUSCULAR | Status: AC
Start: 2024-05-07 — End: 2024-05-07
  Filled 2024-05-07: qty 2

## 2024-05-07 MED ORDER — LIDOCAINE HCL (PF) 1 % IJ SOLN
INTRAMUSCULAR | Status: DC | PRN
  Administered 2024-05-07: 5 mL via INTRADERMAL

## 2024-05-07 MED ORDER — LABETALOL HCL 5 MG/ML IV SOLN: 10.0000 mg | INTRAVENOUS | Status: DC | PRN

## 2024-05-07 MED ORDER — CLOPIDOGREL BISULFATE 75 MG PO TABS
75.0000 mg | ORAL_TABLET | Freq: Every day | ORAL | Status: DC
  Administered 2024-05-08: 75 mg via ORAL
  Filled 2024-05-07: qty 1

## 2024-05-07 MED ORDER — SODIUM CHLORIDE 0.9% FLUSH: 3.0000 mL | INTRAVENOUS | Status: DC | PRN

## 2024-05-07 MED ORDER — FENTANYL CITRATE (PF) 100 MCG/2ML IJ SOLN
INTRAMUSCULAR | Status: DC | PRN
  Administered 2024-05-07 (×2): 25 ug via INTRAVENOUS
  Administered 2024-05-07: 50 ug via INTRAVENOUS

## 2024-05-07 MED ORDER — SODIUM CHLORIDE 0.9% FLUSH
3.0000 mL | Freq: Two times a day (BID) | INTRAVENOUS | Status: DC
  Administered 2024-05-07: 3 mL via INTRAVENOUS

## 2024-05-07 MED ORDER — SODIUM CHLORIDE 0.9 % IV SOLN: INTRAVENOUS | Status: DC

## 2024-05-07 MED ORDER — ONDANSETRON HCL 4 MG/2ML IJ SOLN: 4.0000 mg | Freq: Four times a day (QID) | INTRAMUSCULAR | Status: DC | PRN

## 2024-05-07 MED ORDER — SODIUM CHLORIDE 0.9 % IV SOLN: 250.0000 mL | INTRAVENOUS | Status: DC | PRN

## 2024-05-07 MED ORDER — LIDOCAINE HCL (PF) 1 % IJ SOLN
INTRAMUSCULAR | Status: AC
  Filled 2024-05-07: qty 30

## 2024-05-07 MED ORDER — FENTANYL CITRATE (PF) 100 MCG/2ML IJ SOLN
INTRAMUSCULAR | Status: AC
Start: 2024-05-07 — End: 2024-05-07
  Filled 2024-05-07: qty 2

## 2024-05-07 MED ORDER — MIDAZOLAM HCL 2 MG/2ML IJ SOLN
INTRAMUSCULAR | Status: DC | PRN
  Administered 2024-05-07 (×2): 1 mg via INTRAVENOUS

## 2024-05-07 MED ORDER — SODIUM CHLORIDE 0.9% FLUSH: 3.0000 mL | Freq: Two times a day (BID) | INTRAVENOUS | Status: DC

## 2024-05-07 MED ORDER — ACETAMINOPHEN 325 MG PO TABS: 650.0000 mg | ORAL_TABLET | ORAL | Status: DC | PRN

## 2024-05-07 MED ORDER — HEPARIN (PORCINE) IN NACL 1000-0.9 UT/500ML-% IV SOLN
INTRAVENOUS | Status: DC | PRN
  Administered 2024-05-07 (×2): 500 mL

## 2024-05-07 MED ORDER — SODIUM CHLORIDE 0.9 % IV SOLN: INTRAVENOUS | Status: AC

## 2024-05-07 SURGICAL SUPPLY — 18 items
CATH CROSS OVER TEMPO 5F (CATHETERS) IMPLANT
CATH OMNI FLUSH 5F 65CM (CATHETERS) IMPLANT
CATH QUICKCROSS .018X135CM (MICROCATHETER) IMPLANT
CATH TEMPO AQUA 5F 100CM (CATHETERS) IMPLANT
COVER DOME SNAP 22 D (MISCELLANEOUS) IMPLANT
DEVICE CLOSURE MYNXGRIP 5F (Vascular Products) IMPLANT
GLIDEWIRE ADV .035X260CM (WIRE) IMPLANT
KIT MICROPUNCTURE NIT STIFF (SHEATH) IMPLANT
KIT PV (KITS) ×1 IMPLANT
KIT SINGLE USE MANIFOLD (KITS) IMPLANT
KIT SYRINGE INJ CVI SPIKEX1 (MISCELLANEOUS) IMPLANT
SET ATX-X65L (MISCELLANEOUS) IMPLANT
SHEATH CATAPULT 5FR 60 (SHEATH) IMPLANT
SHEATH PINNACLE 5F 10CM (SHEATH) IMPLANT
SHEATH PROBE COVER 6X72 (BAG) IMPLANT
WIRE BENTSON .035X145CM (WIRE) IMPLANT
WIRE COMMAND ST STR 014 300 (WIRE) IMPLANT
WIRE G V18X300CM (WIRE) IMPLANT

## 2024-05-07 NOTE — Progress Notes (Signed)
 Client up and c/o nausea and vomited sm amt green liquid; color pale; Dr Gretta notified and in to see client

## 2024-05-07 NOTE — Progress Notes (Signed)
 Patient intermittently dizzy and lightheaded with nausea and low blood pressure when she got up twice after her downtime following transfemoral access.  On my evaluation she has normal blood pressure at bedside now in the 120 systolic.  Her groin looks okay on multiple evaluations.  Basic labs checked.  States she is feeling better.  I did give her 1 L bolus as well as several doses of Zofran .  Will keep overnight for observation.  She was bradycardic on arrival this morning prior to any procedure.  She denies chest pain.  EKG rechecked and this is the same as this morning.  Lonni DOROTHA Gaskins, MD [facility] [place] Office: [phone]   Lonni JINNY Gaskins

## 2024-05-07 NOTE — Progress Notes (Addendum)
 Client up and c/o nausea, color pale, and diaphoretic, Dr Gretta in and orders noted, saline bolus given, zofran  given; bedrest additional hour

## 2024-05-07 NOTE — H&P (Signed)
 History and Physical Interval Note:  05/07/2024 7:41 AM  Kiara Little  has presented today for surgery, with the diagnosis of ichemia left leg.  The various methods of treatment have been discussed with the patient and family. After consideration of risks, benefits and other options for treatment, the patient has consented to  Procedure(s): ABDOMINAL AORTOGRAM (N/A) Lower Extremity Angiography (N/A) LOWER EXTREMITY INTERVENTION (N/A) as a surgical intervention.  The patient's history has been reviewed, patient examined, no change in status, stable for surgery.  I have reviewed the patient's chart and labs.  Questions were answered to the patient's satisfaction.     Kiara Little     Patient name: Kiara Little            MRN: [id number]        DOB: [birth year]            Sex: female   REASON FOR CONSULT: PAD follow-up   HPI: Kiara Little is a 88 y.o. female, with multiple medical problems including COPD, hypertension, hyperlipidemia, PAD, chronic back pain that presents for PAD follow-up.  Patient describes increasing pain in her left foot.  States she cannot sleep.  Ongoing for a year and now happening constantly.  This is opposite the leg we have treated in the past.   Patient previously underwent right AT angioplasty as well as right SFA angioplasty via retrograde AT approach on 01/12/2021 for a wound that ultimately healed.         Past Medical History:  Diagnosis Date   Chronic back pain     COPD (chronic obstructive pulmonary disease) (HCC)     COVID-06 October 2019   Hyperlipidemia     Hypertension     Muscle spasm     Noncompliance     Osteoarthritis      a. s/p R THA   PAD (peripheral artery disease) (HCC)                 Past Surgical History:  Procedure Laterality Date   ABDOMINAL AORTOGRAM W/LOWER EXTREMITY N/A 01/12/2021    Procedure: ABDOMINAL AORTOGRAM W/LOWER EXTREMITY;  Surgeon: Little Kiara JINNY, MD;  Location: [facility];   Service: Cardiovascular;  Laterality: N/A;   BACK SURGERY       Cataract surgeries Right 07/2019   JOINT REPLACEMENT   2000    RIGHT TOTAL HIP ARTHROPLASTY   PERIPHERAL VASCULAR BALLOON ANGIOPLASTY Right 01/12/2021    Procedure: PERIPHERAL VASCULAR BALLOON ANGIOPLASTY;  Surgeon: Little Kiara JINNY, MD;  Location: [facility];  Service: Cardiovascular;  Laterality: Right;  AT and SFA   SURGERY FOR TUBAL PREGNANCY       TOTAL HIP ARTHROPLASTY Left 07/24/2013    Procedure: LEFT TOTAL HIP ARTHROPLASTY ANTERIOR APPROACH;  Surgeon: Kiara CINDERELLA Poli, MD;  Location: [facility];  Service: Orthopedics;  Laterality: Left;               Family History  Problem Relation Age of Onset   Heart attack Mother     Diabetes Father     Cancer Brother            SOCIAL HISTORY: Social History         Socioeconomic History   Marital status: Legally Separated      Spouse name: Not on file   Number of children: Not on file   Years of education: Not on file   Highest education  level: Not on file  Occupational History   Not on file  Tobacco Use   Smoking status: Former      Current packs/day: 0.00      Average packs/day: 0.5 packs/day for 40.0 years (20.0 ttl pk-yrs)      Types: Cigarettes      Start date: 07/24/1973      Quit date: 07/24/2013      Years since quitting: 10.7      Passive exposure: Never   Smokeless tobacco: Never  Vaping Use   Vaping status: Never Used  Substance and Sexual Activity   Alcohol use: No   Drug use: No   Sexual activity: Not on file  Other Topics Concern   Not on file  Social History Narrative    Pt lives in [place] with her son.  She is retired.  She does not exercise.    Social Drivers of Acupuncturist Strain: Not on file  Food Insecurity: Low Risk  (01/04/2023)    Received from [facility]    Hunger Vital Sign     Within the past 12 months, you worried that your food would run out before you got money to buy more:  Never true     Within the past 12 months, the food you bought just didn't last and you didn't have money to get more. : Never true  Transportation Needs: No Transportation Needs (01/04/2023)    Received from [employer]     In the past 12 months, has lack of reliable transportation kept you from medical appointments, meetings, work or from getting things needed for daily living? : No  Physical Activity: Not on file  Stress: Not on file  Social Connections: Not on file  Intimate Partner Violence: Not on file      Allergies  No Known Allergies           Current Outpatient Medications  Medication Sig Dispense Refill   amLODipine  (NORVASC ) 10 MG tablet Take 10 mg by mouth every morning.       aspirin  EC 81 MG tablet Take 1 tablet (81 mg total) by mouth daily. Swallow whole.       atorvastatin  (LIPITOR) 80 MG tablet Take 1 tablet (80 mg total) by mouth daily. 90 tablet 3   cholecalciferol (VITAMIN D3) 25 MCG (1000 UNIT) tablet Take 1,000 Units by mouth daily.       clopidogrel  (PLAVIX ) 75 MG tablet TAKE 1 TABLET BY MOUTH EVERY DAY 90 tablet 3   diclofenac Sodium (VOLTAREN) 1 % GEL Apply 1 Application topically 4 (four) times daily as needed (pain).       HYDROcodone -acetaminophen  (NORCO) 7.5-325 MG tablet Take 1 tablet by mouth every 8 (eight) hours as needed (pain.).       RESTASIS 0.05 % ophthalmic emulsion Place 1 drop into both eyes in the morning, at noon, and at bedtime.          No current facility-administered medications for this visit.        REVIEW OF SYSTEMS:  [X]  denotes positive finding, [ ]  denotes negative finding Cardiac   Comments:  Chest pain or chest pressure:      Shortness of breath upon exertion:      Short of breath when lying flat:      Irregular heart rhythm:             Vascular  Pain in calf, thigh, or hip brought on by ambulation:      Pain in feet at night that wakes you up from your sleep:       Blood clot in your veins:       Leg swelling:              Pulmonary      Oxygen at home:      Productive cough:       Wheezing:              Neurologic      Sudden weakness in arms or legs:       Sudden numbness in arms or legs:       Sudden onset of difficulty speaking or slurred speech:      Temporary loss of vision in one eye:       Problems with dizziness:              Gastrointestinal      Blood in stool:       Vomited blood:              Genitourinary      Burning when urinating:       Blood in urine:             Psychiatric      Major depression:              Hematologic      Bleeding problems:      Problems with blood clotting too easily:             Skin      Rashes or ulcers:             Constitutional      Fever or chills:          PHYSICAL EXAM:    Vitals:    04/14/24 1536  BP: 126/72  Pulse: (!) 58  Resp: 18  Temp: 98.3 F (36.8 C)  TempSrc: Temporal  SpO2: 97%  Weight: 190 lb (86.2 kg)  Height: 5' 9 (1.753 m)      GENERAL: The patient is a well-nourished female, in no acute distress. The vital signs are documented above. CARDIAC: There is a regular rate and rhythm.  VASCULAR:  Bilateral femoral pulses palpable No palpable pedal pulses PULMONARY: No respiratory distress. ABDOMEN: Soft and non-tender. MUSCULOSKELETAL: There are no major deformities or cyanosis. NEUROLOGIC: No focal weakness or paresthesias are detected. PSYCHIATRIC: The patient has a normal affect.     DATA:    ABIs today are 0.52 on the right and 0.62 on the left   LOWER EXTREMITY ARTERIAL DUPLEX STUDY  Patient Name:  Kiara Little  Date of Exam:   02/20/2024 Medical Rec #: [id number]        Accession #:    [id number] Date of Birth: [birth year]         Patient Gender: F Patient Age:   55 years Exam Location:  [address] Procedure:      VAS US  LOWER EXTREMITY ARTERIAL DUPLEX Referring Phys: Kiara Little   --------------------------------------------------------------------------- -----   Indications: Peripheral artery disease.  High Risk Factors: Hypertension, hyperlipidemia, past history of smoking.    Current ABI: 0.61/0.61  Comparison Study: None.  Performing Technologist: Garnette Rockers    Examination Guidelines: A complete evaluation includes B-mode imaging, spectral Doppler, color Doppler, and power Doppler as needed of all accessible portions of each vessel. Bilateral testing  is considered an integral part of a complete examination. Limited examinations for reoccurring indications may be performed as noted.         +-----------+--------+-----+---------------+----------+--------+ LEFT       PSV cm/sRatioStenosis       Waveform  Comments +-----------+--------+-----+---------------+----------+--------+ CFA Distal 124                         triphasic          +-----------+--------+-----+---------------+----------+--------+ DFA        72                          biphasic           +-----------+--------+-----+---------------+----------+--------+ SFA Prox   99                          biphasic           +-----------+--------+-----+---------------+----------+--------+ SFA Mid    111                         biphasic           +-----------+--------+-----+---------------+----------+--------+ SFA Distal 209          50-74% stenosisbiphasic           +-----------+--------+-----+---------------+----------+--------+ POP Prox   148          50-74% stenosisbiphasic           +-----------+--------+-----+---------------+----------+--------+ POP Distal 34                                             +-----------+--------+-----+---------------+----------+--------+ ATA Prox   202          50-74% stenosistriphasic          +-----------+--------+-----+---------------+----------+--------+ ATA Distal 17                           monophasic         +-----------+--------+-----+---------------+----------+--------+ PTA Mid    57                          monophasic         +-----------+--------+-----+---------------+----------+--------+ PERO Distal39                          monophasic         +-----------+--------+-----+---------------+----------+--------+       Summary: Left: 50-74% stenosis noted in the superficial femoral artery and/or popliteal artery. 50-74% stenosis noted in the popliteal artery. 50-74% stenosis noted in the anterior tibial artery.    See table(s) above for measurements and observations.     Electronically signed by Kiara Gaskins MD on 02/20/2024 at 3:06:46 PM.      Assessment/Plan:    88 y.o. female, with multiple medical problems including COPD, hypertension, hyperlipidemia, PAD, chronic back pain that presents for PAD follow-up.  Her complaint today is the left foot specifically.  This is opposite the leg we have treated in the past where she has had tissue loss requiring prior infrainguinal intervention.  Her ABIs are in the 0.6 range but she has a classic description of rest pain with the foot aching and keeping  her awake.  I have offered her aortogram, lower extremity arteriogram with a focus on the left leg.  Duplex imaging does show moderate disease throughout the SFA popliteal and tibial segments.  This could involve SFA popliteal angioplasty and/or stent as well as tibial angioplasty.  Discussed this being done in the Cath Lab under moderate sedation.  He does have chronic back pain but I think we can hopefully get her comfortable enough.     Kiara DOROTHA Gaskins, MD [facility] [place] Office: [phone]

## 2024-05-07 NOTE — Op Note (Signed)
 Patient name: Kiara Little MRN: [id number] DOB: 09/27/1935 Sex: female  05/07/2024 Pre-operative Diagnosis: Critical limb ischemia left lower extremity with rest pain Post-operative diagnosis:  Same Surgeon:  Lonni DOROTHA Gaskins, MD Procedure Performed: 1.  Ultrasound-guided access right common femoral artery 2.  Aortogram with catheter selection of aorta 3.  Left lower extremity arteriogram with catheter selection of the popliteal artery as well as the anterior tibial 4.  Unsuccessful antegrade recanalization of the left anterior tibial artery 5.  Mynx closure of the right common femoral artery 6.  54 minutes of monitored moderate conscious sedation time  Indications: 88 year old female seen with pain in her left foot with known history of PAD.  She presents for left lower extremity angiogram with possible invention after risk benefits discussed.  Findings:   Ultrasound-guided access right common femoral artery.  Aortogram showed patent renal arteries bilaterally with a patent infrarenal aorta.  Her iliacs are very tortuous without flow-limiting stenosis.    On the left her common femoral and profunda is widely patent.  Her SFA is patent including the above and below-knee popliteal artery.  She has some moderate disease without obvious flow-limiting stenosis in the SFA above-knee popliteal segment.  Severe tibial disease with occluded posterior tibial and peroneal at the trifurcation.  The anterior tibial is patent proximally and then occludes in the proximal to mid calf.  Distally she reconstitutes a short peroneal at the ankle with a collateral off this filling the anterior tibial at the ankle with flow into the DP.  Ultimately I tried to recanalize the anterior tibial but could not get a wire to go antegrade due to long segment chronic total occlusion.   Procedure:  The patient was identified in the holding area and taken to room 8.  The patient was then placed supine on the table  and prepped and draped in the usual sterile fashion.  A time out was called.  The patient received Versed  and fentanyl  for conscious moderate sedation.  Vital signs were monitored including heart rate, respiratory rate, oxygenation and blood pressure.  I was present for all moderate sedation.  Ultrasound was used to evaluate the right common femoral artery.  It was patent .  A digital ultrasound image was acquired.  A micropuncture needle was used to access the right common femoral artery under ultrasound guidance.  An 018 wire was advanced without resistance and a micropuncture sheath was placed.  The 018 wire was removed and a benson wire was placed.  The micropuncture sheath was exchanged for a 5 french sheath.  An omniflush catheter was advanced over the wire to the level of L-1.  An abdominal angiogram was obtained.  Ultimately did use a crossover catheter with a Glidewire advantage to get my Omni flush catheter into the left external iliac artery.  We then got left leg images with runoff.  Pertinent findings are noted above.  I did take a Glidewire advantage down the left SFA into the popliteal artery with a straight catheter to get better images.  The only target was really the anterior tibial that had distal reconstitution.  I then gave the patient 100 units per kilogram IV heparin .  I placed a 5 French long sheath in the right groin over the aortic bifurcation.  I then went down with a V18 wire and a quick cross catheter and was able to cannulate the anterior tibial.  I did multiple hand injections.  Ultimately could not get my wire through the  chronic total occlusion into the true lumen distally.  I did try different 014 wire command ST unsuccessful as well.  I elected to stop at this point with the only other option  anterior tibial bypass at the ankle.  Wires and catheters were removed.  Mynx closure was placed in the right groin.  Mynx closure was placed in the right common femoral artery.  Plan:  Discussed patient's options are continuing medical management with walking therapies versus tibial bypass to the anterior tibial at the ankle in the left leg.  Will arrange follow-up in 1 month with vein mapping and further discussion.   Lonni DOROTHA Gaskins, MD [facility] [place] Office: [phone]

## 2024-05-07 NOTE — Progress Notes (Signed)
 Patient brought to 4E from cath lab. VSS. Telemetry box applied, CCMD notified. Patient oriented to room and staff. Call bell in reach.  Kenard Gower, RN

## 2024-05-07 NOTE — Progress Notes (Signed)
 Dr Gretta notified of client c/o hands being numb and cold and in to see client

## 2024-05-07 NOTE — Progress Notes (Signed)
 Report called to nurse for 4-E-21 and client transferred via bed

## 2024-05-08 ENCOUNTER — Encounter (HOSPITAL_COMMUNITY): Payer: Self-pay | Admitting: Vascular Surgery

## 2024-05-08 DIAGNOSIS — I70222 Atherosclerosis of native arteries of extremities with rest pain, left leg: Secondary | ICD-10-CM | POA: Diagnosis not present

## 2024-05-08 LAB — CBC
HCT: 26.4 % — ABNORMAL LOW (ref 36.0–46.0)
Hemoglobin: 8.3 g/dL — ABNORMAL LOW (ref 12.0–15.0)
MCH: 29.2 pg (ref 26.0–34.0)
MCHC: 31.4 g/dL (ref 30.0–36.0)
MCV: 93 fL (ref 80.0–100.0)
Platelets: 189 K/uL (ref 150–400)
RBC: 2.84 MIL/uL — ABNORMAL LOW (ref 3.87–5.11)
RDW: 16.1 % — ABNORMAL HIGH (ref 11.5–15.5)
WBC: 11.4 K/uL — ABNORMAL HIGH (ref 4.0–10.5)
nRBC: 0 % (ref 0.0–0.2)

## 2024-05-08 LAB — BASIC METABOLIC PANEL WITH GFR
Anion gap: 9 (ref 5–15)
BUN: 18 mg/dL (ref 8–23)
CO2: 25 mmol/L (ref 22–32)
Calcium: 8.3 mg/dL — ABNORMAL LOW (ref 8.9–10.3)
Chloride: 110 mmol/L (ref 98–111)
Creatinine, Ser: 1.05 mg/dL — ABNORMAL HIGH (ref 0.44–1.00)
GFR, Estimated: 52 mL/min — ABNORMAL LOW (ref >60)
Glucose, Bld: 96 mg/dL (ref 70–99)
Potassium: 4.1 mmol/L (ref 3.5–5.1)
Sodium: 144 mmol/L (ref 135–145)

## 2024-05-08 MED ORDER — POLYETHYLENE GLYCOL 3350 17 G PO PACK
17.0000 g | PACK | Freq: Every day | ORAL | Status: DC
  Administered 2024-05-08: 17 g via ORAL
  Filled 2024-05-08: qty 1

## 2024-05-08 NOTE — Discharge Instructions (Signed)

## 2024-05-08 NOTE — Care Management Obs Status (Signed)
 MEDICARE OBSERVATION STATUS NOTIFICATION   Patient Details  Name: Kiara Little MRN: [id number] Date of Birth: 02/28/1936   Medicare Observation Status Notification Given:  Yes    Vonzell Arrie Sharps 05/08/2024, 9:28 AM

## 2024-05-08 NOTE — Progress Notes (Addendum)
  Progress Note    05/08/2024 7:55 AM 1 Day Post-Op  Subjective:  no further nausea or dizziness   Vitals:   05/08/24 0419 05/08/24 0746  BP: (!) 100/52 (!) 108/53  Pulse: (!) 55 (!) 59  Resp: 20 20  Temp: 99.4 F (37.4 C) 98.7 F (37.1 C)  SpO2: 96% 95%   Physical Exam: Lungs:  non labored Incisions:  R groin without hematoma Extremities:  moving all ext well; feet warm Abdomen:  soft Neurologic: A&O  CBC    Component Value Date/Time   WBC 11.4 (H) 05/08/2024 0357   RBC 2.84 (L) 05/08/2024 0357   HGB 8.3 (L) 05/08/2024 0357   HCT 26.4 (L) 05/08/2024 0357   PLT 189 05/08/2024 0357   MCV 93.0 05/08/2024 0357   MCH 29.2 05/08/2024 0357   MCHC 31.4 05/08/2024 0357   RDW 16.1 (H) 05/08/2024 0357   LYMPHSABS 1.7 01/01/2023 1500   MONOABS 0.4 01/01/2023 1500   EOSABS 0.1 01/01/2023 1500   BASOSABS 0.0 01/01/2023 1500    BMET    Component Value Date/Time   NA 144 05/08/2024 0357   K 4.1 05/08/2024 0357   CL 110 05/08/2024 0357   CO2 25 05/08/2024 0357   GLUCOSE 96 05/08/2024 0357   BUN 18 05/08/2024 0357   CREATININE 1.05 (H) 05/08/2024 0357   CALCIUM  8.3 (L) 05/08/2024 0357   GFRNONAA 52 (L) 05/08/2024 0357   GFRAA 55 (L) 09/18/2019 1658    INR    Component Value Date/Time   INR 1.02 07/14/2013 1230     Intake/Output Summary (Last 24 hours) at 05/08/2024 0755 Last data filed at 05/07/2024 1800 Gross per 24 hour  Intake 361.07 ml  Output --  Net 361.07 ml     Assessment/Plan:  88 y.o. female is s/p diagnostic LLE angiogram 1 Day Post-Op   Kiara Little was kept overnight due to post operative nausea and dizziness.  These symptoms have resolved.  R groin cath site is without hematoma.  She is ready for discharge home.  She will follow up with Dr. Gretta in 1 month with vein mapping to discuss options   Donnice Sender, PA-C [facility] [phone] 05/08/2024 7:55 AM   I have seen and evaluated the patient. I agree with the PA  note as documented above.  She underwent right transfemoral access yesterday with left leg angiogram.  Admitted due to nausea vomiting dizziness getting out of bed post procedure when her downtime was up with an episode of hypotension.  Hemoglobin did drop and discussed she may have had some bleeding around the sheath at the time of removal.  Groin looks great with no hematoma or pain.   Otherwise looks much better today.  Will get her out of bed and make sure she does okay and hopefully plan for discharge.  1 month follow-up to discuss ongoing conservative management versus tibial bypass.  Kiara DOROTHA Gretta, MD [facility] [place] Office: [phone]

## 2024-05-08 NOTE — Progress Notes (Signed)
 Patient ambulated 28ft. Tolerated well. issues.Discharge instructions were given. Patient verbalized understanding. All questions were answered.

## 2024-05-10 NOTE — Discharge Summary (Signed)
  Discharge Summary  Patient ID: Kiara Little [id number] 88 y.o. 02/18/1936  Admit date: 05/07/2024  Discharge date and time: 05/08/2024 10:09 AM   Admitting Physician: Kiara JINNY Gaskins, MD   Discharge Physician: same  Admission Diagnoses: PAD (peripheral artery disease) (HCC) [I73.9]  Discharge Diagnoses: same  Admission Condition: fair  Discharged Condition: fair  Indication for Admission: observation  Hospital Course: [name] is an 88 year old female with critical limb ischemia of the left lower extremity with rest pain.  She was brought in as an outpatient by Dr. Gaskins on 05/07/2024 and underwent diagnostic left lower extremity angiogram.  She was kept in observation postoperatively due to dizziness and nausea with standing after her procedure.  Postoperative day #1 the symptoms resolved.  Based on angiography she would require a tibial bypass.  At the time of discharge her right groin appeared unremarkable without hematoma.  She will follow-up with Dr. Gaskins with saphenous vein mapping in about 1 month to discuss continued conservative management versus tibial bypass surgery.  She was discharged home in stable condition.  Consults: None  Treatments: surgery: Diagnostic left lower extremity angiogram by Dr. Gaskins on 05/07/2024  Discharge Exam: See progress note 05/08/24 Vitals:   05/08/24 0419 05/08/24 0746  BP: (!) 100/52 (!) 108/53  Pulse: (!) 55 (!) 59  Resp: 20 20  Temp: 99.4 F (37.4 C) 98.7 F (37.1 C)  SpO2: 96% 95%     Disposition: Discharge disposition: 01-Home or Self Care       Patient Instructions:  Allergies as of 05/08/2024   No Known Allergies      Medication List     TAKE these medications    amLODipine  10 MG tablet Commonly known as: NORVASC  Take 10 mg by mouth every morning.   aspirin  EC 81 MG tablet Take 1 tablet (81 mg total) by mouth daily. Swallow whole.   atorvastatin  80 MG tablet Commonly known as: LIPITOR Take  1 tablet (80 mg total) by mouth daily. What changed: how much to take   BIOFREEZE EX Apply 1 application  topically daily as needed (pain).   clopidogrel  75 MG tablet Commonly known as: PLAVIX  TAKE 1 TABLET BY MOUTH EVERY DAY   HYDROcodone -acetaminophen  7.5-325 MG tablet Commonly known as: NORCO Take 1 tablet by mouth every 8 (eight) hours as needed (pain.).   Restasis 0.05 % ophthalmic emulsion Generic drug: cycloSPORINE Place 1 drop into both eyes 2 (two) times daily as needed (dry eyes).   Vitamin D3 125 MCG (5000 UT) Tabs Take 5,000 Units by mouth daily.       Activity: activity as tolerated Diet: regular diet Wound Care: keep wound clean and dry  Follow-up with Dr. Gaskins in 1 month.  SignedBETHA Donnice Sender, PA-C 05/10/2024 10:24 AM [facility] Office: [phone]

## 2024-05-11 ENCOUNTER — Telehealth: Payer: Self-pay

## 2024-05-11 ENCOUNTER — Ambulatory Visit (HOSPITAL_COMMUNITY)
Admission: RE | Admit: 2024-05-11 | Discharge: 2024-05-11 | Disposition: A | Discharge status: Home / Self Care | Source: Ambulatory Visit | Attending: Vascular Surgery | Admitting: Vascular Surgery

## 2024-05-11 DIAGNOSIS — I739 Peripheral vascular disease, unspecified: Secondary | ICD-10-CM | POA: Insufficient documentation

## 2024-05-11 NOTE — Progress Notes (Unsigned)
 Patient name: PATRECIA VEIGA MRN: [id number] DOB: [birth year] Sex: female  REASON FOR CONSULT: Triage visit, left foot pain  HPI: Randa Riss Knight is a 88 y.o. female, with multiple medical problems including COPD, hypertension, hyperlipidemia, PAD, chronic back pain that presents for triage visit for ongoing left foot pain.  This has been ongoing for years.  She underwent angiogram on 05/07/24 and has severe occlusive tibial disease with all tibials occluded.  Patient previously underwent right AT angioplasty as well as right SFA angioplasty via retrograde AT approach on 01/12/2021 for a wound that ultimately healed.    Past Medical History:  Diagnosis Date   Chronic back pain    COPD (chronic obstructive pulmonary disease) (HCC)    COVID-06 October 2019   Hyperlipidemia    Hypertension    Muscle spasm    Noncompliance    Osteoarthritis    a. s/p R THA   PAD (peripheral artery disease) (HCC)     Past Surgical History:  Procedure Laterality Date   ABDOMINAL AORTOGRAM N/A 05/07/2024   Procedure: ABDOMINAL AORTOGRAM;  Surgeon: Gretta Lonni PARAS, MD;  Location: [facility];  Service: Cardiovascular;  Laterality: N/A;   ABDOMINAL AORTOGRAM W/LOWER EXTREMITY N/A 01/12/2021   Procedure: ABDOMINAL AORTOGRAM W/LOWER EXTREMITY;  Surgeon: Gretta Lonni PARAS, MD;  Location: [facility];  Service: Cardiovascular;  Laterality: N/A;   BACK SURGERY     Cataract surgeries Right 07/2019   JOINT REPLACEMENT  2000   RIGHT TOTAL HIP ARTHROPLASTY   LOWER EXTREMITY ANGIOGRAPHY N/A 05/07/2024   Procedure: Lower Extremity Angiography;  Surgeon: Gretta Lonni PARAS, MD;  [facility];  Service: Cardiovascular;  Laterality: N/A;   LOWER EXTREMITY INTERVENTION N/A 05/07/2024   Procedure: LOWER EXTREMITY INTERVENTION;  Surgeon: Gretta Lonni PARAS, MD;  Location: [facility];  Service: Cardiovascular;  Laterality: N/A;   PERIPHERAL VASCULAR BALLOON ANGIOPLASTY  Right 01/12/2021   Procedure: PERIPHERAL VASCULAR BALLOON ANGIOPLASTY;  Surgeon: Gretta Lonni PARAS, MD;  Location: [facility];  Service: Cardiovascular;  Laterality: Right;  AT and SFA   SURGERY FOR TUBAL PREGNANCY     TOTAL HIP ARTHROPLASTY Left 07/24/2013   Procedure: LEFT TOTAL HIP ARTHROPLASTY ANTERIOR APPROACH;  Surgeon: Lonni CINDERELLA Poli, MD;  Location: [facility];  Service: Orthopedics;  Laterality: Left;    Family History  Problem Relation Age of Onset   Heart attack Mother    Diabetes Father    Cancer Brother     SOCIAL HISTORY: Social History   Socioeconomic History   Marital status: Legally Separated    Spouse name: Not on file   Number of children: Not on file   Years of education: Not on file   Highest education level: Not on file  Occupational History   Not on file  Tobacco Use   Smoking status: Former    Current packs/day: 0.00    Average packs/day: 0.5 packs/day for 40.0 years (20.0 ttl pk-yrs)    Types: Cigarettes    Start date: 07/24/1973    Quit date: 07/24/2013    Years since quitting: 10.8    Passive exposure: Never   Smokeless tobacco: Never  Vaping Use   Vaping status: Never Used  Substance and Sexual Activity   Alcohol use: No   Drug use: No   Sexual activity: Not on file  Other Topics Concern   Not on file  Social History Narrative   Pt lives in   with her son.  She is retired.  She does not exercise.   Social Drivers of Corporate investment banker Strain: Not on file  Food Insecurity: Patient Declined (05/08/2024)   Hunger Vital Sign    Worried About Running Out of Food in the Last Year: Patient declined    Ran Out of Food in the Last Year: Patient declined  Transportation Needs: Patient Declined (05/08/2024)   PRAPARE - Administrator, Civil Service (Medical): Patient declined    Lack of Transportation (Non-Medical): Patient declined  Physical Activity: Not on file  Stress: Not on file  Social Connections:  Patient Declined (05/08/2024)   Social Connection and Isolation Panel    Frequency of Communication with Friends and Family: Patient declined    Frequency of Social Gatherings with Friends and Family: Patient declined    Attends Religious Services: Patient declined    Database administrator or Organizations: Patient declined    Attends Banker Meetings: Patient declined    Marital Status: Patient declined  Intimate Partner Violence: Patient Declined (05/08/2024)   Humiliation, Afraid, Rape, and Kick questionnaire    Fear of Current or Ex-Partner: Patient declined    Emotionally Abused: Patient declined    Physically Abused: Patient declined    Sexually Abused: Patient declined    No Known Allergies  Current Outpatient Medications  Medication Sig Dispense Refill   amLODipine  (NORVASC ) 10 MG tablet Take 10 mg by mouth every morning.     aspirin  EC 81 MG tablet Take 1 tablet (81 mg total) by mouth daily. Swallow whole.     atorvastatin  (LIPITOR) 80 MG tablet Take 1 tablet (80 mg total) by mouth daily. (Patient taking differently: Take 40 mg by mouth daily.) 90 tablet 3   Cholecalciferol (VITAMIN D3) 125 MCG (5000 UT) TABS Take 5,000 Units by mouth daily.     clopidogrel  (PLAVIX ) 75 MG tablet TAKE 1 TABLET BY MOUTH EVERY DAY 90 tablet 3   HYDROcodone -acetaminophen  (NORCO) 7.5-325 MG tablet Take 1 tablet by mouth every 8 (eight) hours as needed (pain.).     Menthol , Topical Analgesic, (BIOFREEZE EX) Apply 1 application  topically daily as needed (pain).     RESTASIS 0.05 % ophthalmic emulsion Place 1 drop into both eyes 2 (two) times daily as needed (dry eyes).     No current facility-administered medications for this visit.    REVIEW OF SYSTEMS:  [X]  denotes positive finding, [ ]  denotes negative finding Cardiac  Comments:  Chest pain or chest pressure:    Shortness of breath upon exertion:    Short of breath when lying flat:    Irregular heart rhythm:        Vascular     Pain in calf, thigh, or hip brought on by ambulation:    Pain in feet at night that wakes you up from your sleep:     Blood clot in your veins:    Leg swelling:         Pulmonary    Oxygen at home:    Productive cough:     Wheezing:         Neurologic    Sudden weakness in arms or legs:     Sudden numbness in arms or legs:     Sudden onset of difficulty speaking or slurred speech:    Temporary loss of vision in one eye:     Problems with dizziness:  Gastrointestinal    Blood in stool:     Vomited blood:         Genitourinary    Burning when urinating:     Blood in urine:        Psychiatric    Major depression:         Hematologic    Bleeding problems:    Problems with blood clotting too easily:        Skin    Rashes or ulcers:        Constitutional    Fever or chills:      PHYSICAL EXAM: There were no vitals filed for this visit.   GENERAL: The patient is a well-nourished female, in no acute distress. The vital signs are documented above. CARDIAC: There is a regular rate and rhythm.  VASCULAR:  Bilateral femoral pulses palpable No palpable pedal pulses PULMONARY: No respiratory distress. ABDOMEN: Soft and non-tender. MUSCULOSKELETAL: There are no major deformities or cyanosis. NEUROLOGIC: No focal weakness or paresthesias are detected. PSYCHIATRIC: The patient has a normal affect.   DATA:   ABIs today are 0.52 on the right and 0.62 on the left  Assessment/Plan:  88 y.o. female, with multiple medical problems including COPD, hypertension, hyperlipidemia, PAD, chronic back pain that presents for triage visit for ongoing left foot pain.  This has been ongoing for years.  She underwent angiogram on 05/07/24 and has severe occlusive tibial disease with all tibials occluded.  Underwent unsuccessful attempted recanalization of the anterior tibial which is really the only decent vessel that reconstituted at the ankle.  The only other option would be an  anterior tibial bypass at the ankle at age 7 as we discussed again today.   Lonni DOROTHA Gaskins, MD [facility] [place] Office: [phone]

## 2024-05-11 NOTE — Telephone Encounter (Signed)
 Pt called with worsening LLE rest pain/swelling that she states is worse than it was in the hospital. Her saphenous vein mapping and f/u with MD has been moved to tomorrow. Pt is aware.

## 2024-05-12 ENCOUNTER — Telehealth: Payer: Self-pay

## 2024-05-12 ENCOUNTER — Encounter: Payer: Self-pay | Admitting: Vascular Surgery

## 2024-05-12 ENCOUNTER — Encounter (HOSPITAL_COMMUNITY)

## 2024-05-12 ENCOUNTER — Ambulatory Visit: Attending: Vascular Surgery | Admitting: Vascular Surgery

## 2024-05-12 VITALS — BP 112/62 | HR 84 | Temp 98.7°F | Resp 18 | Ht 69.0 in | Wt 200.0 lb

## 2024-05-12 DIAGNOSIS — I70222 Atherosclerosis of native arteries of extremities with rest pain, left leg: Secondary | ICD-10-CM

## 2024-05-12 MED ORDER — CILOSTAZOL 100 MG PO TABS: 100.0000 mg | ORAL_TABLET | Freq: Two times a day (BID) | ORAL | 11 refills | Status: DC

## 2024-05-12 NOTE — Telephone Encounter (Signed)
 Left VM

## 2024-05-18 ENCOUNTER — Emergency Department (HOSPITAL_COMMUNITY)
Admission: EM | Admit: 2024-05-18 | Discharge: 2024-05-18 | Disposition: A | Discharge status: Home / Self Care | Attending: Emergency Medicine | Admitting: Emergency Medicine

## 2024-05-18 ENCOUNTER — Emergency Department (HOSPITAL_COMMUNITY)

## 2024-05-18 ENCOUNTER — Other Ambulatory Visit: Payer: Self-pay

## 2024-05-18 ENCOUNTER — Encounter (HOSPITAL_COMMUNITY): Payer: Self-pay

## 2024-05-18 DIAGNOSIS — R531 Weakness: Secondary | ICD-10-CM | POA: Diagnosis not present

## 2024-05-18 DIAGNOSIS — R5383 Other fatigue: Secondary | ICD-10-CM | POA: Insufficient documentation

## 2024-05-18 DIAGNOSIS — Z79899 Other long term (current) drug therapy: Secondary | ICD-10-CM | POA: Diagnosis not present

## 2024-05-18 DIAGNOSIS — R011 Cardiac murmur, unspecified: Secondary | ICD-10-CM | POA: Diagnosis not present

## 2024-05-18 DIAGNOSIS — I10 Essential (primary) hypertension: Secondary | ICD-10-CM | POA: Diagnosis not present

## 2024-05-18 DIAGNOSIS — R7989 Other specified abnormal findings of blood chemistry: Secondary | ICD-10-CM | POA: Insufficient documentation

## 2024-05-18 DIAGNOSIS — D72829 Elevated white blood cell count, unspecified: Secondary | ICD-10-CM | POA: Diagnosis not present

## 2024-05-18 DIAGNOSIS — Z7902 Long term (current) use of antithrombotics/antiplatelets: Secondary | ICD-10-CM | POA: Diagnosis not present

## 2024-05-18 DIAGNOSIS — R110 Nausea: Secondary | ICD-10-CM | POA: Insufficient documentation

## 2024-05-18 DIAGNOSIS — Z7982 Long term (current) use of aspirin: Secondary | ICD-10-CM | POA: Diagnosis not present

## 2024-05-18 LAB — COMPREHENSIVE METABOLIC PANEL WITH GFR
ALT: 44 U/L (ref 0–44)
AST: 50 U/L — ABNORMAL HIGH (ref 15–41)
Albumin: 3.1 g/dL — ABNORMAL LOW (ref 3.5–5.0)
Alkaline Phosphatase: 99 U/L (ref 38–126)
Anion gap: 14 (ref 5–15)
BUN: 12 mg/dL (ref 8–23)
CO2: 25 mmol/L (ref 22–32)
Calcium: 8.8 mg/dL — ABNORMAL LOW (ref 8.9–10.3)
Chloride: 101 mmol/L (ref 98–111)
Creatinine, Ser: 0.81 mg/dL (ref 0.44–1.00)
GFR, Estimated: >60 mL/min (ref >60)
Glucose, Bld: 118 mg/dL — ABNORMAL HIGH (ref 70–99)
Potassium: 3.5 mmol/L (ref 3.5–5.1)
Sodium: 140 mmol/L (ref 135–145)
Total Bilirubin: 1.9 mg/dL — ABNORMAL HIGH (ref 0.0–1.2)
Total Protein: 7.5 g/dL (ref 6.5–8.1)

## 2024-05-18 LAB — URINALYSIS, ROUTINE W REFLEX MICROSCOPIC
Bacteria, UA: NONE SEEN
Bilirubin Urine: NEGATIVE
Glucose, UA: NEGATIVE mg/dL
Hgb urine dipstick: NEGATIVE
Ketones, ur: NEGATIVE mg/dL
Nitrite: NEGATIVE
Protein, ur: NEGATIVE mg/dL
Specific Gravity, Urine: 1.013 (ref 1.005–1.030)
pH: 6 (ref 5.0–8.0)

## 2024-05-18 LAB — CBC
HCT: 29.3 % — ABNORMAL LOW (ref 36.0–46.0)
Hemoglobin: 9.4 g/dL — ABNORMAL LOW (ref 12.0–15.0)
MCH: 29.9 pg (ref 26.0–34.0)
MCHC: 32.1 g/dL (ref 30.0–36.0)
MCV: 93.3 fL (ref 80.0–100.0)
Platelets: 478 K/uL — ABNORMAL HIGH (ref 150–400)
RBC: 3.14 MIL/uL — ABNORMAL LOW (ref 3.87–5.11)
RDW: 16.2 % — ABNORMAL HIGH (ref 11.5–15.5)
WBC: 14.6 K/uL — ABNORMAL HIGH (ref 4.0–10.5)
nRBC: 0.3 % — ABNORMAL HIGH (ref 0.0–0.2)

## 2024-05-18 LAB — TROPONIN I (HIGH SENSITIVITY)
Troponin I (High Sensitivity): 23 ng/L — ABNORMAL HIGH (ref <18)
Troponin I (High Sensitivity): 20 ng/L — ABNORMAL HIGH (ref <18)

## 2024-05-18 LAB — SARS CORONAVIRUS 2 BY RT PCR: SARS Coronavirus 2 by RT PCR: NEGATIVE

## 2024-05-18 LAB — TSH: TSH: 0.948 u[IU]/mL (ref 0.350–4.500)

## 2024-05-18 LAB — CBG MONITORING, ED: Glucose-Capillary: 113 mg/dL — ABNORMAL HIGH (ref 70–99)

## 2024-05-18 NOTE — Discharge Instructions (Addendum)
 You are seen in the emergency department for general weakness and fatigue.  You had a chest x-ray urinalysis lab work and COVID testing that did not show an obvious explanation for your symptoms.  Please rest and keep well-hydrated.  Follow-up with your primary care doctor.  Return to the emergency department if any worsening or concerning symptoms

## 2024-05-18 NOTE — ED Triage Notes (Signed)
 Pt c/o weakness this morning feeling as if she was going to pass out. Pt states she had a vitamin B12 shot last week and has not felt right since. Pt denies dizziness. Pt state n/v.

## 2024-05-18 NOTE — ED Provider Notes (Signed)
 [place] EMERGENCY DEPARTMENT AT [facility] Provider Note   CSN: [id number] Arrival date & time: 05/18/24  1301     Patient presents with: Fatigue   Kiara Little is a 88 y.o. female.  She has a history of hypertension peripheral artery disease lower extremity ischemia.  Complaining of worsening general weakness after she received a B12 shot on Friday.  The shot was to help her with weakness.  Since then she is felt worse.  Nausea no vomiting.  No fevers or chills but felt hot this morning.  Has been constipated but took a pill and moved her bowels.  No cough or shortness of breath no urinary symptoms.  She just had a lower extremity arteriogram a few weeks ago and was prescribed Pletal  but has not started it yet due to not feeling well.  {Add pertinent medical, surgical, social history, OB history to YEP:67052} The history is provided by the patient.  Weakness Severity:  Moderate Onset quality:  Gradual Duration:  3 days Timing:  Constant Progression:  Worsening Chronicity:  New Relieved by:  Nothing Worsened by:  Activity Ineffective treatments:  Rest Associated symptoms: nausea   Associated symptoms: no abdominal pain, no chest pain, no cough, no diarrhea, no dysuria, no falls, no fever, no frequency, no shortness of breath and no vomiting        Prior to Admission medications   Medication Sig Start Date End Date Taking? Authorizing Provider  amLODipine  (NORVASC ) 10 MG tablet Take 10 mg by mouth every morning.    [provider]  aspirin  EC 81 MG tablet Take 1 tablet (81 mg total) by mouth daily. Swallow whole. 01/27/24   Debera Jayson MATSU, MD  atorvastatin  (LIPITOR) 80 MG tablet Take 1 tablet (80 mg total) by mouth daily. Patient taking differently: Take 40 mg by mouth daily. 02/12/24   Debera Jayson MATSU, MD  Cholecalciferol (VITAMIN D3) 125 MCG (5000 UT) TABS Take 5,000 Units by mouth daily.    [provider]  cilostazol  (PLETAL ) 100 MG  tablet Take 1 tablet (100 mg total) by mouth 2 (two) times daily before a meal. 05/12/24   Gretta Lonni PARAS, MD  clopidogrel  (PLAVIX ) 75 MG tablet TAKE 1 TABLET BY MOUTH EVERY DAY 11/07/21   Sheree Penne Lonni, MD  HYDROcodone -acetaminophen  (NORCO) 7.5-325 MG tablet Take 1 tablet by mouth every 8 (eight) hours as needed (pain.). 01/04/21   [provider]  Menthol , Topical Analgesic, (BIOFREEZE EX) Apply 1 application  topically daily as needed (pain).    [provider]  RESTASIS 0.05 % ophthalmic emulsion Place 1 drop into both eyes 2 (two) times daily as needed (dry eyes).    [provider]    Allergies: Patient has no known allergies.    Review of Systems  Constitutional:  Positive for fatigue. Negative for fever.  Eyes:  Negative for visual disturbance.  Respiratory:  Negative for cough and shortness of breath.   Cardiovascular:  Negative for chest pain.  Gastrointestinal:  Positive for nausea. Negative for abdominal pain, diarrhea and vomiting.  Genitourinary:  Negative for dysuria and frequency.  Musculoskeletal:  Positive for back pain (chronic). Negative for falls.  Neurological:  Positive for weakness.    Updated Vital Signs Ht 5' 9 (1.753 m)   Wt 90.7 kg   BMI 29.54 kg/m   Physical Exam Vitals and nursing note reviewed.  Constitutional:      General: She is not in acute distress.  Appearance: Normal appearance. She is well-developed.  HENT:     Head: Normocephalic and atraumatic.  Eyes:     Conjunctiva/sclera: Conjunctivae normal.  Cardiovascular:     Rate and Rhythm: Normal rate. Rhythm irregular.     Heart sounds: Murmur heard.  Pulmonary:     Effort: Pulmonary effort is normal. No respiratory distress.     Breath sounds: Normal breath sounds. No stridor. No wheezing.  Abdominal:     Palpations: Abdomen is soft.     Tenderness: There is no abdominal tenderness. There is no guarding or rebound.  Musculoskeletal:         General: No deformity.     Cervical back: Neck supple.  Skin:    General: Skin is warm and dry.  Neurological:     General: No focal deficit present.     Mental Status: She is alert.     GCS: GCS eye subscore is 4. GCS verbal subscore is 5. GCS motor subscore is 6.     (all labs ordered are listed, but only abnormal results are displayed) Labs Reviewed  CBG MONITORING, ED - Abnormal; Notable for the following components:      Result Value   Glucose-Capillary 113 (*)    All other components within normal limits  COMPREHENSIVE METABOLIC PANEL WITH GFR  CBC  URINALYSIS, ROUTINE W REFLEX MICROSCOPIC  CBG MONITORING, ED  TROPONIN I (HIGH SENSITIVITY)    EKG: None  Radiology: No results found.  {Document cardiac monitor, telemetry assessment procedure when appropriate:32947} Procedures   Medications Ordered in the ED - No data to display    {Click here for ABCD2, HEART and other calculators REFRESH Note before signing:1}                              Medical Decision Making Amount and/or Complexity of Data Reviewed Labs: ordered. Radiology: ordered.   This patient complains of ***; this involves an extensive number of treatment Options and is a complaint that carries with it a high risk of complications and morbidity. The differential includes ***  I ordered, reviewed and interpreted labs, which included *** I ordered medication *** and reviewed PMP when indicated. I ordered imaging studies which included *** and I independently    visualized and interpreted imaging which showed *** Additional history obtained from *** Previous records obtained and reviewed *** I consulted *** and discussed lab and imaging findings and discussed disposition.  Cardiac monitoring reviewed, *** Social determinants considered, *** Critical Interventions: ***  After the interventions stated above, I reevaluated the patient and found *** Admission and further testing considered,  ***   {Document critical care time when appropriate  Document review of labs and clinical decision tools ie CHADS2VASC2, etc  Document your independent review of radiology images and any outside records  Document your discussion with family members, caretakers and with consultants  Document social determinants of health affecting pt's care  Document your decision making why or why not admission, treatments were needed:32947:::1}   Final diagnoses:  None    ED Discharge Orders     None

## 2024-06-16 ENCOUNTER — Encounter: Admitting: Vascular Surgery

## 2024-06-16 ENCOUNTER — Encounter (HOSPITAL_COMMUNITY)

## 2024-06-22 ENCOUNTER — Telehealth: Payer: Self-pay

## 2024-06-22 NOTE — Telephone Encounter (Signed)
 Pt called stating she received a letter from us  to make an appt. She was here 5 weeks ago and just recently started Pletal . She states it is helping a little. She does not sound interested in proceeding with a bypass which I explained to her, per MD note, is the other option. She is aware to call us  back if she has any changes or concerns that arise.

## 2024-07-08 NOTE — Progress Notes (Unsigned)
 Cardiology Office Note    Date:  07/09/2024  ID:  Little, Kiara 1936/03/08, MRN [id number] Cardiologist: Jayson Sierras, MD { :  History of Present Illness:    Kiara Little is a 88 y.o. female with past medical history of HTN, HLD, carotid artery stenosis and PAD who presents to the office today for 30-month follow-up.  She was last examined by Dr. Sierras in 01/2024 and reported occasional sensations in her chest at night but no exertional chest pain or specific palpitations. No changes were made to her cardiac medications and she was continued on Amlodipine  10 mg daily, ASA 81 mg daily, Atorvastatin  40 mg daily and Plavix  75 mg daily (on this per Vascular).  She was admitted to [facility] in 04/2024 by the Vascular Surgery team for critical left limb ischemia. She underwent unsuccessful attempt at recannulization of the left anterior tibial artery and it was recommended to consider tibial bypass at the time of her follow-up appointment. She did review this with Dr. Gretta at her appointment later that month and she was not in favor of undergoing tibial bypass given her age and was started on Pletal  to help with her symptoms.  In talking with the patient today, she reports her biggest issue is pain along her left leg. This does not occur with activity as she denies any pain with walking. Reports more pain at nighttime which keeps her from sleeping. Tried Lyrica  in the past but says she self discontinued as this did not help her symptoms but only tried the lower dose. She has also self-discontinued Pletal  since her last visit with Vascular as she did not feel that this was helping with symptoms either. She denies any chest pain or palpitations.  Has baseline dyspnea on exertion but no acute changes in this. No specific orthopnea, PND or pitting edema.  Studies Reviewed:   EKG: EKG is not ordered today.  Echocardiogram: 12/2019 IMPRESSIONS     1. Left ventricular ejection fraction,  by estimation, is 55%. The left  ventricle has normal function. The left ventricle has no regional wall  motion abnormalities. There is moderate left ventricular hypertrophy. Left  ventricular diastolic parameters are   consistent with Grade I diastolic dysfunction (impaired relaxation).   2. Right ventricular systolic function is normal. The right ventricular  size is normal. Tricuspid regurgitation signal is inadequate for assessing  PA pressure.   3. Left atrial size was mildly dilated.   4. The mitral valve is grossly normal. Mild mitral valve regurgitation.   5. The aortic valve is tricuspid. Aortic valve regurgitation is not  visualized.   6. The inferior vena cava is normal in size with greater than 50%  respiratory variability, suggesting right atrial pressure of 3 mmHg.    NST: 12/2019 Left bundle branch block present throughout the infusion with occasional PACs and PVCs. Medium sized, mild to moderate intensity, mid to apical anteroseptal and basal anterolateral defects that are fixed suggesting attenuation artifact in light of normal wall motion, although cannot definitively rule out scar given defect intensity. This is a low risk study. Nuclear stress EF: 52%.  Carotid Dopplers: 09/2023 Summary:  Right Carotid: Velocities in the right ICA are consistent with a 1-39%  stenosis.                Non-hemodynamically significant plaque <50% noted in the  CCA. The                 ECA  appears <50% stenosed.   Left Carotid: Velocities in the left ICA are consistent with a 1-39%  stenosis.               Non-hemodynamically significant plaque <50% noted in the  CCA. The                ECA appears <50% stenosed.   Vertebrals:  Bilateral vertebral arteries demonstrate antegrade flow.  Subclavians: Normal flow hemodynamics were seen in bilateral subclavian               arteries.    Physical Exam:   VS:  BP 120/68 (BP Location: Right Arm, Cuff Size: Normal)   Pulse 74   Ht  5' 9 (1.753 m)   Wt 192 lb (87.1 kg)   SpO2 100%   BMI 28.35 kg/m    Wt Readings from Last 3 Encounters:  07/09/24 192 lb (87.1 kg)  05/18/24 200 lb (90.7 kg)  05/12/24 200 lb (90.7 kg)     GEN: Well nourished, well developed female appearing in no acute distress NECK: No JVD; No carotid bruits CARDIAC: RRR, no murmurs, rubs, gallops RESPIRATORY:  Clear to auscultation without rales, wheezing or rhonchi  ABDOMEN: Appears non-distended. No obvious abdominal masses. EXTREMITIES: No clubbing or cyanosis. No pitting edema.  Distal pedal pulses are 1+ bilaterally.   Assessment and Plan:   1. Essential hypertension, benign - BP is well-controlled at 120/68 during today's visit. Continue current medical therapy with Amlodipine  10 mg daily.  2. Hyperlipidemia LDL goal <70 - FLP in 01/2024 showed her LDL was at 90 and Atorvastatin  was increased to 80 mg daily. LFT's in 05/2024 showed her AST was mildly elevated at 50 and ALT was at 44. She has not had a repeat FLP since in our system. She does report having labs with her PCP within the past month and we will request a copy of records.  3. PAD (peripheral artery disease) - Followed closely by Vascular Surgery and recently underwent unsuccessful attempt at recannulization of her left anterior tibial artery in 04/2024 as outlined above. Options in regards to undergoing tibial bypass were reviewed but she declined given her age.  - Based off her description as outlined above, symptoms seem atypical for claudication and more concerning for neuropathy. She was previously on Lyrica  but only tried one dose and self-discontinued. I encouraged her to reach out to her PCP to see if she would benefit from trying a higher dose of the medication to see if this helps with symptoms. Remains on ASA 81 mg daily, Plavix  75 mg daily and Atorvastatin  80 mg daily. Previously self-discontinued Pletal  as discussed above.  4. Bilateral carotid artery stenosis -  Carotid dopplers in 09/2023 showed 1 to 39% stenosis bilaterally. Continue ASA 81 mg daily and Atorvastatin  80 mg daily.   Signed, Laymon CHRISTELLA Qua, PA-C

## 2024-07-09 ENCOUNTER — Encounter: Payer: Self-pay | Admitting: *Deleted

## 2024-07-09 ENCOUNTER — Encounter: Payer: Self-pay | Admitting: Student

## 2024-07-09 ENCOUNTER — Ambulatory Visit: Attending: Student | Admitting: Student

## 2024-07-09 VITALS — BP 120/68 | HR 74 | Ht 69.0 in | Wt 192.0 lb

## 2024-07-09 DIAGNOSIS — I739 Peripheral vascular disease, unspecified: Secondary | ICD-10-CM

## 2024-07-09 DIAGNOSIS — I6523 Occlusion and stenosis of bilateral carotid arteries: Secondary | ICD-10-CM

## 2024-07-09 DIAGNOSIS — E785 Hyperlipidemia, unspecified: Secondary | ICD-10-CM | POA: Diagnosis not present

## 2024-07-09 DIAGNOSIS — I10 Essential (primary) hypertension: Secondary | ICD-10-CM | POA: Diagnosis not present

## 2024-07-09 NOTE — Patient Instructions (Signed)
 Medication Instructions:  Your physician recommends that you continue on your current medications as directed. Please refer to the Current Medication list given to you today.  *If you need a refill on your cardiac medications before your next appointment, please call your pharmacy*  Lab Work: NONE   If you have labs (blood work) drawn today and your tests are completely normal, you will receive your results only by: MyChart Message (if you have MyChart) OR A paper copy in the mail If you have any lab test that is abnormal or we need to change your treatment, we will call you to review the results.  Testing/Procedures: NONE   Follow-Up: At [facility], you and your health needs are our priority.  As part of our continuing mission to provide you with exceptional heart care, our providers are all part of one team.  This team includes your primary Cardiologist (physician) and Advanced Practice Providers or APPs (Physician Assistants and Nurse Practitioners) who all work together to provide you with the care you need, when you need it.  Your next appointment:   6 month(s)  Provider:   Jayson Sierras, MD    We recommend signing up for the patient portal called MyChart.  Sign up information is provided on this After Visit Summary.  MyChart is used to connect with patients for Virtual Visits (Telemedicine).  Patients are able to view lab/test results, encounter notes, upcoming appointments, etc.  Non-urgent messages can be sent to your provider as well.   To learn more about what you can do with MyChart, go to [email or web address].   Other Instructions Thank you for choosing [facility]!

## 2024-08-10 NOTE — Progress Notes (Unsigned)
 Patient name: Kiara Little MRN: [id number] DOB: 01-06-1936 Sex: female  REASON FOR CONSULT: 24-month follow-up, left leg PAD  HPI: Kiara Little is a 88 y.o. female, with multiple medical problems including COPD, hypertension, hyperlipidemia, PAD, chronic back pain that presents for 32-month follow-up of left leg PAD.  She underwent angiogram on 05/07/24 and has severe occlusive tibial disease with all tibials occluded.  I offered her left leg bypass to the anterior tibial as I was unsuccessful getting through the occluded segment endovascular.  States she is having more pain in her left foot at night that keeps her awake.  Describes it as a stabbing shooting pain.  Lyrica  did help her in the past but she ran out.  Patient previously underwent right AT angioplasty as well as right SFA angioplasty via retrograde AT approach on 01/12/2021 for a wound that ultimately healed.    Past Medical History:  Diagnosis Date   Chronic back pain    COPD (chronic obstructive pulmonary disease) (HCC)    COVID-06 October 2019   Hyperlipidemia    Hypertension    Muscle spasm    Noncompliance    Osteoarthritis    a. s/p R THA   PAD (peripheral artery disease)     Past Surgical History:  Procedure Laterality Date   ABDOMINAL AORTOGRAM N/A 05/07/2024   Procedure: ABDOMINAL AORTOGRAM;  Surgeon: Gretta Lonni PARAS, MD;  Location: [facility] INVASIVE CV LAB;  Service: Cardiovascular;  Laterality: N/A;   ABDOMINAL AORTOGRAM W/LOWER EXTREMITY N/A 01/12/2021   Procedure: ABDOMINAL AORTOGRAM W/LOWER EXTREMITY;  Surgeon: Gretta Lonni PARAS, MD;  Location: [facility] INVASIVE CV LAB;  Service: Cardiovascular;  Laterality: N/A;   BACK SURGERY     Cataract surgeries Right 07/2019   JOINT REPLACEMENT  2000   RIGHT TOTAL HIP ARTHROPLASTY   LOWER EXTREMITY ANGIOGRAPHY N/A 05/07/2024   Procedure: Lower Extremity Angiography;  Surgeon: Gretta Lonni PARAS, MD;  Location: [facility] INVASIVE CV LAB;  Service: Cardiovascular;   Laterality: N/A;   LOWER EXTREMITY INTERVENTION N/A 05/07/2024   Procedure: LOWER EXTREMITY INTERVENTION;  Surgeon: Gretta Lonni PARAS, MD;  Location: [facility] INVASIVE CV LAB;  Service: Cardiovascular;  Laterality: N/A;   PERIPHERAL VASCULAR BALLOON ANGIOPLASTY Right 01/12/2021   Procedure: PERIPHERAL VASCULAR BALLOON ANGIOPLASTY;  Surgeon: Gretta Lonni PARAS, MD;  Location: [facility] INVASIVE CV LAB;  Service: Cardiovascular;  Laterality: Right;  AT and SFA   SURGERY FOR TUBAL PREGNANCY     TOTAL HIP ARTHROPLASTY Left 07/24/2013   Procedure: LEFT TOTAL HIP ARTHROPLASTY ANTERIOR APPROACH;  Surgeon: Lonni CINDERELLA Poli, MD;  Location: [facility];  Service: Orthopedics;  Laterality: Left;    Family History  Problem Relation Age of Onset   Heart attack Mother    Diabetes Father    Cancer Brother     SOCIAL HISTORY: Social History   Socioeconomic History   Marital status: Legally Separated    Spouse name: Not on file   Number of children: Not on file   Years of education: Not on file   Highest education level: Not on file  Occupational History   Not on file  Tobacco Use   Smoking status: Former    Current packs/day: 0.00    Average packs/day: 0.5 packs/day for 40.0 years (20.0 ttl pk-yrs)    Types: Cigarettes    Start date: 07/24/1973    Quit date: 07/24/2013    Years since quitting: 11.0    Passive exposure: Never   Smokeless tobacco: Never  Vaping Use   Vaping status: Never Used  Substance and Sexual Activity   Alcohol use: No   Drug use: No   Sexual activity: Not on file  Other Topics Concern   Not on file  Social History Narrative   Pt lives in [place] with her son.  She is retired.  She does not exercise.   Social Drivers of Corporate Investment Banker Strain: Not on file  Food Insecurity: Patient Declined (05/08/2024)   Hunger Vital Sign    Worried About Running Out of Food in the Last Year: Patient declined    Ran Out of Food in the Last Year: Patient declined   Transportation Needs: Patient Declined (05/08/2024)   PRAPARE - Administrator, Civil Service (Medical): Patient declined    Lack of Transportation (Non-Medical): Patient declined  Physical Activity: Not on file  Stress: Not on file  Social Connections: Patient Declined (05/08/2024)   Social Connection and Isolation Panel    Frequency of Communication with Friends and Family: Patient declined    Frequency of Social Gatherings with Friends and Family: Patient declined    Attends Religious Services: Patient declined    Database Administrator or Organizations: Patient declined    Attends Banker Meetings: Patient declined    Marital Status: Patient declined  Intimate Partner Violence: Patient Declined (05/08/2024)   Humiliation, Afraid, Rape, and Kick questionnaire    Fear of Current or Ex-Partner: Patient declined    Emotionally Abused: Patient declined    Physically Abused: Patient declined    Sexually Abused: Patient declined    No Known Allergies  Current Outpatient Medications  Medication Sig Dispense Refill   amLODipine  (NORVASC ) 10 MG tablet Take 10 mg by mouth every morning.     aspirin  EC 81 MG tablet Take 1 tablet (81 mg total) by mouth daily. Swallow whole.     atorvastatin  (LIPITOR) 80 MG tablet Take 1 tablet (80 mg total) by mouth daily. (Patient taking differently: Take 40 mg by mouth daily.) 90 tablet 3   Cholecalciferol (VITAMIN D3) 125 MCG (5000 UT) TABS Take 5,000 Units by mouth daily.     clopidogrel  (PLAVIX ) 75 MG tablet TAKE 1 TABLET BY MOUTH EVERY DAY 90 tablet 3   DULoxetine (CYMBALTA) 20 MG capsule Take 20 mg by mouth 2 (two) times daily.     furosemide (LASIX) 20 MG tablet Take 20 mg by mouth daily.     HYDROcodone -acetaminophen  (NORCO) 7.5-325 MG tablet Take 1 tablet by mouth every 8 (eight) hours as needed (pain.).     Menthol , Topical Analgesic, (BIOFREEZE EX) Apply 1 application  topically daily as needed (pain).     pregabalin   (LYRICA ) 75 MG capsule Take 75 mg by mouth 2 (two) times daily. (Patient not taking: Reported on 07/09/2024)     RESTASIS 0.05 % ophthalmic emulsion Place 1 drop into both eyes 2 (two) times daily as needed (dry eyes).     No current facility-administered medications for this visit.    REVIEW OF SYSTEMS:  [X]  denotes positive finding, [ ]  denotes negative finding Cardiac  Comments:  Chest pain or chest pressure:    Shortness of breath upon exertion:    Short of breath when lying flat:    Irregular heart rhythm:        Vascular    Pain in calf, thigh, or hip brought on by ambulation:    Pain in feet at night that wakes you up from  your sleep:  x left  Blood clot in your veins:    Leg swelling:         Pulmonary    Oxygen at home:    Productive cough:     Wheezing:         Neurologic    Sudden weakness in arms or legs:     Sudden numbness in arms or legs:     Sudden onset of difficulty speaking or slurred speech:    Temporary loss of vision in one eye:     Problems with dizziness:         Gastrointestinal    Blood in stool:     Vomited blood:         Genitourinary    Burning when urinating:     Blood in urine:        Psychiatric    Major depression:         Hematologic    Bleeding problems:    Problems with blood clotting too easily:        Skin    Rashes or ulcers:        Constitutional    Fever or chills:      PHYSICAL EXAM: There were no vitals filed for this visit.   GENERAL: The patient is a well-nourished female, in no acute distress. The vital signs are documented above. CARDIAC: There is a regular rate and rhythm.  VASCULAR:  Bilateral femoral pulses palpable Left AT/DP monophasic Doppler signal that is brisk No left leg tissue loss   DATA:   ABIs previously were 0.52 on the right and 0.62 on the left  Assessment/Plan:  88 y.o. female, with multiple medical problems including COPD, hypertension, hyperlipidemia, PAD, chronic back pain that  presents for 29-month follow-up for evaluation of her left leg PAD.  She underwent angiogram on 05/07/24 and has severe occlusive tibial disease with all tibials occluded.  Underwent unsuccessful attempted recanalization of the anterior tibial which is really the only decent vessel that reconstitutes at the ankle.    The only other option would be an anterior tibial bypass at the ankle that we again discussed again today.  I reviewed her angiogram pictures from the past to help her better understand the anatomy.  Ultimately she does not want a bypass at age 35.  I discussed the other option is medical management.  She does feel that Lyrica  helped her in the past significantly but she ran out.  I have prescribed her 75 mg twice daily Lyrica  with 1 year supply.  I will see her in 3 months with repeat ABIs.  No tissue loss at this time just pain in the foot at night that certainly could be consistent with rest pain.  Again discussed bypass would be the standard to help her if she changes her mind.  Lonni DOROTHA Gaskins, MD [facility] Office: [phone]

## 2024-08-11 ENCOUNTER — Encounter: Payer: Self-pay | Admitting: Vascular Surgery

## 2024-08-11 ENCOUNTER — Ambulatory Visit: Attending: Vascular Surgery | Admitting: Vascular Surgery

## 2024-08-11 VITALS — BP 115/66 | HR 58 | Temp 98.1°F | Resp 20 | Ht 69.0 in | Wt 187.8 lb

## 2024-08-11 DIAGNOSIS — I70222 Atherosclerosis of native arteries of extremities with rest pain, left leg: Secondary | ICD-10-CM

## 2024-08-11 MED ORDER — PREGABALIN 75 MG PO CAPS: 75.0000 mg | ORAL_CAPSULE | Freq: Two times a day (BID) | ORAL | 11 refills | Status: AC

## 2024-08-12 ENCOUNTER — Other Ambulatory Visit: Payer: Self-pay

## 2024-08-12 DIAGNOSIS — I739 Peripheral vascular disease, unspecified: Secondary | ICD-10-CM

## 2024-09-23 ENCOUNTER — Encounter (HOSPITAL_COMMUNITY): Payer: Self-pay

## 2024-09-23 ENCOUNTER — Emergency Department (HOSPITAL_COMMUNITY)

## 2024-09-23 ENCOUNTER — Other Ambulatory Visit: Payer: Self-pay

## 2024-09-23 ENCOUNTER — Emergency Department (HOSPITAL_COMMUNITY)
Admission: EM | Admit: 2024-09-23 | Discharge: 2024-09-23 | Disposition: A | Discharge status: Home / Self Care | Attending: Emergency Medicine | Admitting: Emergency Medicine

## 2024-09-23 DIAGNOSIS — Z7902 Long term (current) use of antithrombotics/antiplatelets: Secondary | ICD-10-CM | POA: Insufficient documentation

## 2024-09-23 DIAGNOSIS — S8991XA Unspecified injury of right lower leg, initial encounter: Secondary | ICD-10-CM | POA: Diagnosis present

## 2024-09-23 DIAGNOSIS — S8391XA Sprain of unspecified site of right knee, initial encounter: Secondary | ICD-10-CM | POA: Insufficient documentation

## 2024-09-23 DIAGNOSIS — W010XXA Fall on same level from slipping, tripping and stumbling without subsequent striking against object, initial encounter: Secondary | ICD-10-CM | POA: Diagnosis not present

## 2024-09-23 DIAGNOSIS — Z7982 Long term (current) use of aspirin: Secondary | ICD-10-CM | POA: Insufficient documentation

## 2024-09-23 DIAGNOSIS — Z79899 Other long term (current) drug therapy: Secondary | ICD-10-CM | POA: Insufficient documentation
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
## 2024-09-23 NOTE — ED Notes (Signed)
 Pt given hot pack for knee and pillow for comfort.
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
## 2024-09-23 NOTE — ED Notes (Signed)
 Pt taken to bathroom via WC.

## 2024-10-09 ENCOUNTER — Encounter (HOSPITAL_COMMUNITY): Payer: Self-pay | Admitting: *Deleted

## 2024-10-09 ENCOUNTER — Emergency Department (HOSPITAL_COMMUNITY)
Admission: EM | Admit: 2024-10-09 | Discharge: 2024-10-09 | Discharge status: Against Medical Advice | Attending: Emergency Medicine | Admitting: Emergency Medicine

## 2024-10-09 ENCOUNTER — Other Ambulatory Visit: Payer: Self-pay

## 2024-10-09 DIAGNOSIS — M79605 Pain in left leg: Secondary | ICD-10-CM | POA: Insufficient documentation

## 2024-10-09 DIAGNOSIS — Z5321 Procedure and treatment not carried out due to patient leaving prior to being seen by health care provider: Secondary | ICD-10-CM | POA: Diagnosis not present

## 2024-10-09 NOTE — ED Triage Notes (Signed)
 Pt c/o pain to back of left leg x 2 days  Pt states the place a hard area; pt was seen at her PCP and told to come to ED to have imaging

## 2024-10-13 ENCOUNTER — Other Ambulatory Visit: Payer: Self-pay | Admitting: Sports Medicine

## 2024-10-13 ENCOUNTER — Other Ambulatory Visit: Payer: Self-pay | Admitting: Family

## 2024-10-13 DIAGNOSIS — M7122 Synovial cyst of popliteal space [Baker], left knee: Secondary | ICD-10-CM

## 2024-10-14 ENCOUNTER — Inpatient Hospital Stay: Admission: RE | Admit: 2024-10-14 | Discharge: 2024-10-14 | Attending: Family | Admitting: Family

## 2024-10-14 DIAGNOSIS — M7122 Synovial cyst of popliteal space [Baker], left knee: Secondary | ICD-10-CM

## 2024-11-03 ENCOUNTER — Ambulatory Visit: Admitting: Vascular Surgery

## 2024-11-03 ENCOUNTER — Ambulatory Visit (HOSPITAL_COMMUNITY)
